# Patient Record
Sex: Female | Born: 1960
Health system: Southern US, Community
[De-identification: ages and names within clinical notes are randomized; demographics above are authoritative.]

## PROBLEM LIST (undated history)

## (undated) ENCOUNTER — Emergency Department (HOSPITAL_COMMUNITY): Admission: EM | Payer: 59

## (undated) DIAGNOSIS — K760 Fatty (change of) liver, not elsewhere classified: Secondary | ICD-10-CM

## (undated) DIAGNOSIS — F329 Major depressive disorder, single episode, unspecified: Secondary | ICD-10-CM

## (undated) DIAGNOSIS — J45909 Unspecified asthma, uncomplicated: Secondary | ICD-10-CM

## (undated) DIAGNOSIS — J449 Chronic obstructive pulmonary disease, unspecified: Secondary | ICD-10-CM

## (undated) DIAGNOSIS — E785 Hyperlipidemia, unspecified: Secondary | ICD-10-CM

## (undated) DIAGNOSIS — F419 Anxiety disorder, unspecified: Secondary | ICD-10-CM

## (undated) DIAGNOSIS — K219 Gastro-esophageal reflux disease without esophagitis: Secondary | ICD-10-CM

## (undated) DIAGNOSIS — T7840XA Allergy, unspecified, initial encounter: Secondary | ICD-10-CM

## (undated) DIAGNOSIS — K2970 Gastritis, unspecified, without bleeding: Secondary | ICD-10-CM

## (undated) DIAGNOSIS — Z87442 Personal history of urinary calculi: Secondary | ICD-10-CM

## (undated) DIAGNOSIS — M1990 Unspecified osteoarthritis, unspecified site: Secondary | ICD-10-CM

## (undated) DIAGNOSIS — M549 Dorsalgia, unspecified: Secondary | ICD-10-CM

## (undated) DIAGNOSIS — K5790 Diverticulosis of intestine, part unspecified, without perforation or abscess without bleeding: Secondary | ICD-10-CM

## (undated) DIAGNOSIS — F32A Depression, unspecified: Secondary | ICD-10-CM

## (undated) DIAGNOSIS — K449 Diaphragmatic hernia without obstruction or gangrene: Secondary | ICD-10-CM

## (undated) DIAGNOSIS — G894 Chronic pain syndrome: Secondary | ICD-10-CM

## (undated) HISTORY — DX: Allergy, unspecified, initial encounter: T78.40XA

## (undated) HISTORY — DX: Chronic pain syndrome: G89.4

## (undated) HISTORY — DX: Fatty (change of) liver, not elsewhere classified: K76.0

## (undated) HISTORY — DX: Diaphragmatic hernia without obstruction or gangrene: K44.9

## (undated) HISTORY — PX: ABDOMINAL HYSTERECTOMY: SHX81

## (undated) HISTORY — DX: Unspecified asthma, uncomplicated: J45.909

## (undated) HISTORY — DX: Anxiety disorder, unspecified: F41.9

## (undated) HISTORY — DX: Major depressive disorder, single episode, unspecified: F32.9

## (undated) HISTORY — DX: Hyperlipidemia, unspecified: E78.5

## (undated) HISTORY — PX: OTHER SURGICAL HISTORY: SHX169

## (undated) HISTORY — DX: Chronic obstructive pulmonary disease, unspecified: J44.9

## (undated) HISTORY — DX: Gastro-esophageal reflux disease without esophagitis: K21.9

## (undated) HISTORY — DX: Gastritis, unspecified, without bleeding: K29.70

## (undated) HISTORY — DX: Diverticulosis of intestine, part unspecified, without perforation or abscess without bleeding: K57.90

## (undated) HISTORY — DX: Depression, unspecified: F32.A

---

## 1999-01-03 ENCOUNTER — Encounter (INDEPENDENT_AMBULATORY_CARE_PROVIDER_SITE_OTHER): Payer: Self-pay | Admitting: Specialist

## 1999-01-03 ENCOUNTER — Inpatient Hospital Stay (HOSPITAL_COMMUNITY): Admission: RE | Admit: 1999-01-03 | Discharge: 1999-01-05 | Payer: Self-pay | Admitting: Urology

## 2002-12-01 ENCOUNTER — Inpatient Hospital Stay (HOSPITAL_COMMUNITY): Admission: EM | Admit: 2002-12-01 | Discharge: 2002-12-04 | Payer: Self-pay | Admitting: Psychiatry

## 2003-04-15 ENCOUNTER — Emergency Department (HOSPITAL_COMMUNITY): Admission: EM | Admit: 2003-04-15 | Discharge: 2003-04-15 | Payer: Self-pay | Admitting: Emergency Medicine

## 2003-04-16 ENCOUNTER — Ambulatory Visit (HOSPITAL_BASED_OUTPATIENT_CLINIC_OR_DEPARTMENT_OTHER): Admission: RE | Admit: 2003-04-16 | Discharge: 2003-04-16 | Payer: Self-pay | Admitting: Urology

## 2003-04-22 ENCOUNTER — Ambulatory Visit (HOSPITAL_COMMUNITY): Admission: RE | Admit: 2003-04-22 | Discharge: 2003-04-22 | Payer: Self-pay | Admitting: Urology

## 2003-11-18 ENCOUNTER — Encounter: Admission: RE | Admit: 2003-11-18 | Discharge: 2003-11-18 | Payer: Self-pay | Admitting: Specialist

## 2009-01-28 ENCOUNTER — Ambulatory Visit (HOSPITAL_COMMUNITY): Admission: RE | Admit: 2009-01-28 | Discharge: 2009-01-28 | Payer: Self-pay | Admitting: Anesthesiology

## 2009-06-02 ENCOUNTER — Encounter: Admission: RE | Admit: 2009-06-02 | Discharge: 2009-06-02 | Payer: Self-pay | Admitting: Anesthesiology

## 2009-12-03 ENCOUNTER — Emergency Department: Payer: Self-pay | Admitting: Emergency Medicine

## 2010-06-25 ENCOUNTER — Encounter: Payer: Self-pay | Admitting: Emergency Medicine

## 2010-07-05 ENCOUNTER — Encounter: Payer: Self-pay | Admitting: Emergency Medicine

## 2010-10-20 NOTE — Discharge Summary (Signed)
Valerie Key, Valerie Key                             ACCOUNT NO.:  [id number]   MEDICAL RECORD NO.:  [id number]                   PATIENT TYPE:  IPS   LOCATION:  0502                                 FACILITY:  [facility]   PHYSICIAN:  Jeanice Lim, M.D.              DATE OF BIRTH:  1960/07/03   DATE OF ADMISSION:  12/01/2002  DATE OF DISCHARGE:  12/04/2002                                 DISCHARGE SUMMARY   IDENTIFYING DATA:  This is a 50 year old married Caucasian female,  voluntarily admitted with a history of depression, reporting increased  symptoms for 2-4 weeks, with depressed mood, anhedonia, lethargy, poor  concentration.  Child custody case was one stressor.  Overdosed on 10 Paxil  in a suicide attempt after 3 drinks of alcohol.   ADMISSION MEDICATIONS:  Paxil 20 mg q.a.m. for 3 years.   ALLERGIES:  SULFA MEDICINES.   PHYSICAL EXAMINATION:  Essentially within normal limits, neurologically  nonfocal.   ROUTINE ADMISSION LABS:  Were within normal limits.   MENTAL STATUS EXAM:  Fully alert, no acute distress female with tearful  affect.  Appearance:  Grooming was adequate.  Speech within normal limits,  mood depressed, helpless.  Thought process goal directed, positive suicidal  ideation, no psychosis, cognitively intact.  Judgment and insight fair to  questionable, with impulse control history questionable.   ADMISSION DIAGNOSES:   AXIS I:  1. Major depressive disorder, recurrent, severe.  2. Rule out alcohol abuse.   AXIS II:  Deferred.   AXIS III:  Status post Paxil overdose.   AXIS IV:  Moderate problems with primary support group.   AXIS V:  30/60.   HOSPITAL COURSE:  The patient was admitted and ordered routine p.r.n.  medications, underwent further monitoring, and was encouraged to participate  in individual, group and milieu therapy.  To complained of severe depressive  symptoms, stressors including custody battle for shared custody with  children.  Had been  drinking 1-3 drinks per day, level on admission was 0.88  showing a clear alcohol dependency and abuse history.  The patient had  responded to Paxil in the past, regretted attempt and reported decrease in  suicidal thoughts, improvement in mood and motivation to stop drinking as  well, reporting positive response to clinical intervention.   CONDITION ON DISCHARGE:  Improved.  Mood was more euthymic, affect brighter,  thought process goal directed.  Thought content negative for dangerous  ideation or psychotic symptoms.  The patient reported motivation to be  compliant with the aftercare plan.   DISCHARGE MEDICATIONS:  1. Paxil CR 25 mg 2 q.a.m.  2. Trazodone 50 mg p.o. q.h.s.   DISPOSITION:  Follow up with [facility], Tobey Bride, on July 12 and Dr. Senaida Ores on July 23 at 9 a.m.   DISCHARGE DIAGNOSES:   AXIS I:  1. Major depressive disorder, recurrent, severe.  2. Rule  out alcohol abuse.   AXIS II:  Deferred.   AXIS III:  Status post Paxil overdose.   AXIS IV:  Moderate problems with primary support group.   AXIS V:  Global assessment of function on discharge was 55.                                                 Jeanice Lim, M.D.    JEM/MEDQ  D:  12/30/2002  T:  12/30/2002  Job:  [id number]

## 2010-10-20 NOTE — Op Note (Signed)
[name], Valerie Key                             ACCOUNT NO.:  [id number]   MEDICAL RECORD NO.:  [id number]                   PATIENT TYPE:  AMB   LOCATION:  NESC                                 FACILITY:  [facility]   PHYSICIAN:  Boston Service, M.D.             DATE OF BIRTH:  1961-01-30   DATE OF PROCEDURE:  04/16/2003  DATE OF DISCHARGE:                                 OPERATIVE REPORT   PREOPERATIVE DIAGNOSIS:  A 6-8 mm calculus, left midureter, appears smaller  on KUB, larger on CT.  The patient has a two-week history of intractable  left costovertebral angle tenderness.   POSTOPERATIVE DIAGNOSIS:  A 6-8 mm calculus, left mid ureter,appears smaller  on KUB, larger on CT.  The patient has a two-week history of intractable  left costovertebral angle tenderness.   PROCEDURES:  1. Cystoscopy.  2. Ureteroscopy.  3. Double J stent placement.   ANESTHESIA:  General.   DRAINS:  6 French, 24 cm double-J stent.   DESCRIPTION OF PROCEDURE:  The patient was prepped and draped in the dorsal  lithotomy position after institution of an adequate level of general  anesthesia.  A well-lubricated 21-French panendoscope was gently inserted at  the urethral meatus.  Normal urethra and sphincter.  Mild ureterocele on  both the right and left sides.  Right retrograde showed normal course and  caliber of the ureter, pelvis and calyces with prompt drainage at three to  five minutes.  Left retrograde showed tight ureteral spasm in the midureter  with 6-8 mm filling defect.  A floppy-tip guide wire could not be safely  negotiated past the stone.  A Glidewire was then negotiated beyond the  stone.  Due to spasm within the midureter, a 6 French end-hole catheter was  advanced over the Glidewire, left in place for five minutes, and then  withdrawn.  The ureteroscope was then inserted alongside the Glidewire.  Area of erythema and edema within the midureter was identified but no stone  was identified.   Strong clinical suspicion that the stone had migrated back  into the left renal pelvis.  Careful inspection of the entire ureter, from  the UPJ to the UVJ, showed no stony fragments or anatomic deformity.  The  ureteroscope was then removed.  A double J stent was inserted over the  indwelling guidewire with excellent pigtail formation on guidewire removal.  The bladder was drained.  The patient was given 30 mg of Toradol and a B&O  suppository.  and returned to the recovery room in satisfactory condition.                                               Boston Service, M.D.    RH/MEDQ  D:  04/16/2003  T:  04/16/2003  Job:  [id number]

## 2011-12-30 ENCOUNTER — Emergency Department (HOSPITAL_COMMUNITY)
Admission: EM | Admit: 2011-12-30 | Discharge: 2011-12-30 | Discharge status: Against Medical Advice | Payer: Commercial Managed Care - PPO | Attending: Emergency Medicine | Admitting: Emergency Medicine

## 2011-12-30 ENCOUNTER — Encounter (HOSPITAL_COMMUNITY): Payer: Self-pay | Admitting: *Deleted

## 2011-12-30 DIAGNOSIS — R110 Nausea: Secondary | ICD-10-CM | POA: Insufficient documentation

## 2011-12-30 DIAGNOSIS — R1011 Right upper quadrant pain: Secondary | ICD-10-CM | POA: Insufficient documentation

## 2011-12-30 HISTORY — DX: Unspecified osteoarthritis, unspecified site: M19.90

## 2011-12-30 HISTORY — DX: Dorsalgia, unspecified: M54.9

## 2011-12-30 LAB — POCT I-STAT, CHEM 8
BUN: 11 mg/dL (ref 6–23)
Calcium, Ion: 1.17 mmol/L (ref 1.12–1.23)
Chloride: 103 mEq/L (ref 96–112)
Creatinine, Ser: 0.8 mg/dL (ref 0.50–1.10)
Glucose, Bld: 129 mg/dL — ABNORMAL HIGH (ref 70–99)
HCT: 46 % (ref 36.0–46.0)
Hemoglobin: 15.6 g/dL — ABNORMAL HIGH (ref 12.0–15.0)
Potassium: 3.4 mEq/L — ABNORMAL LOW (ref 3.5–5.1)
Sodium: 141 mEq/L (ref 135–145)
TCO2: 27 mmol/L (ref 0–100)

## 2011-12-30 NOTE — ED Notes (Signed)
Patient reports indigestion and increased pain in her upper right abd after eating for 2 weeks.  She has had increasing pain and nausea for 2 days.  Patient denies chest pain, denies shoulder pain.  She states her pain is pin pointed to the right upper abd

## 2011-12-31 ENCOUNTER — Encounter: Payer: Self-pay | Admitting: Internal Medicine

## 2012-01-01 ENCOUNTER — Encounter (HOSPITAL_COMMUNITY): Payer: Self-pay | Admitting: Emergency Medicine

## 2012-01-01 ENCOUNTER — Emergency Department (HOSPITAL_COMMUNITY)
Admission: EM | Admit: 2012-01-01 | Discharge: 2012-01-01 | Disposition: A | Discharge status: Home / Self Care | Payer: BC Managed Care – PPO | Attending: Emergency Medicine | Admitting: Emergency Medicine

## 2012-01-01 ENCOUNTER — Emergency Department (HOSPITAL_COMMUNITY): Payer: BC Managed Care – PPO

## 2012-01-01 DIAGNOSIS — R109 Unspecified abdominal pain: Secondary | ICD-10-CM | POA: Insufficient documentation

## 2012-01-01 DIAGNOSIS — R10811 Right upper quadrant abdominal tenderness: Secondary | ICD-10-CM | POA: Insufficient documentation

## 2012-01-01 DIAGNOSIS — N390 Urinary tract infection, site not specified: Secondary | ICD-10-CM | POA: Insufficient documentation

## 2012-01-01 LAB — LIPASE, BLOOD: Lipase: 26 U/L (ref 11–59)

## 2012-01-01 LAB — URINALYSIS, ROUTINE W REFLEX MICROSCOPIC
Bilirubin Urine: NEGATIVE
Glucose, UA: NEGATIVE mg/dL
Hgb urine dipstick: NEGATIVE
Ketones, ur: NEGATIVE mg/dL
Leukocytes, UA: NEGATIVE
Nitrite: POSITIVE — AB
Protein, ur: NEGATIVE mg/dL
Specific Gravity, Urine: 1.012 (ref 1.005–1.030)
Urobilinogen, UA: 0.2 mg/dL (ref 0.0–1.0)
pH: 6.5 (ref 5.0–8.0)

## 2012-01-01 LAB — CBC WITH DIFFERENTIAL/PLATELET
Basophils Absolute: 0.1 10*3/uL (ref 0.0–0.1)
Basophils Relative: 0 % (ref 0–1)
Eosinophils Absolute: 0.6 10*3/uL (ref 0.0–0.7)
Eosinophils Relative: 5 % (ref 0–5)
HCT: 42.6 % (ref 36.0–46.0)
Hemoglobin: 14.6 g/dL (ref 12.0–15.0)
Lymphocytes Relative: 28 % (ref 12–46)
Lymphs Abs: 3.6 10*3/uL (ref 0.7–4.0)
MCH: 30.9 pg (ref 26.0–34.0)
MCHC: 34.3 g/dL (ref 30.0–36.0)
MCV: 90.3 fL (ref 78.0–100.0)
Monocytes Absolute: 0.8 10*3/uL (ref 0.1–1.0)
Monocytes Relative: 6 % (ref 3–12)
Neutro Abs: 7.6 10*3/uL (ref 1.7–7.7)
Neutrophils Relative %: 60 % (ref 43–77)
Platelets: 243 10*3/uL (ref 150–400)
RBC: 4.72 MIL/uL (ref 3.87–5.11)
RDW: 13.4 % (ref 11.5–15.5)
WBC: 12.7 10*3/uL — ABNORMAL HIGH (ref 4.0–10.5)

## 2012-01-01 LAB — COMPREHENSIVE METABOLIC PANEL
ALT: 13 U/L (ref 0–35)
AST: 19 U/L (ref 0–37)
Albumin: 4.1 g/dL (ref 3.5–5.2)
Alkaline Phosphatase: 79 U/L (ref 39–117)
BUN: 10 mg/dL (ref 6–23)
CO2: 29 mEq/L (ref 19–32)
Calcium: 9.7 mg/dL (ref 8.4–10.5)
Chloride: 101 mEq/L (ref 96–112)
Creatinine, Ser: 0.64 mg/dL (ref 0.50–1.10)
GFR calc Af Amer: >90 mL/min (ref >90)
GFR calc non Af Amer: >90 mL/min (ref >90)
Glucose, Bld: 94 mg/dL (ref 70–99)
Potassium: 4 mEq/L (ref 3.5–5.1)
Sodium: 140 mEq/L (ref 135–145)
Total Bilirubin: 0.2 mg/dL — ABNORMAL LOW (ref 0.3–1.2)
Total Protein: 7.6 g/dL (ref 6.0–8.3)

## 2012-01-01 LAB — POCT I-STAT, CHEM 8
BUN: 10 mg/dL (ref 6–23)
Chloride: 103 mEq/L (ref 96–112)
Glucose, Bld: 91 mg/dL (ref 70–99)
HCT: 45 % (ref 36.0–46.0)
Potassium: 4 mEq/L (ref 3.5–5.1)

## 2012-01-01 LAB — URINE MICROSCOPIC-ADD ON

## 2012-01-01 LAB — AMYLASE: Amylase: 71 U/L (ref 0–105)

## 2012-01-01 MED ORDER — NITROFURANTOIN MONOHYD MACRO 100 MG PO CAPS: 100.0000 mg | ORAL_CAPSULE | Freq: Two times a day (BID) | ORAL | Status: AC

## 2012-01-01 MED ORDER — ONDANSETRON HCL 4 MG PO TABS: 4.0000 mg | ORAL_TABLET | Freq: Four times a day (QID) | ORAL | Status: DC | PRN

## 2012-01-01 MED ORDER — ONDANSETRON HCL 4 MG/2ML IJ SOLN
4.0000 mg | Freq: Once | INTRAMUSCULAR | Status: AC
Start: 2012-01-01 — End: 2012-01-01
  Administered 2012-01-01: 4 mg via INTRAVENOUS
  Filled 2012-01-01: qty 2

## 2012-01-01 MED ORDER — MORPHINE SULFATE 4 MG/ML IJ SOLN
4.0000 mg | Freq: Once | INTRAMUSCULAR | Status: AC
  Administered 2012-01-01: 4 mg via INTRAVENOUS
  Filled 2012-01-01: qty 1

## 2012-01-01 MED ORDER — ONDANSETRON HCL 4 MG PO TABS: 4.0000 mg | ORAL_TABLET | Freq: Four times a day (QID) | ORAL | Status: AC | PRN

## 2012-01-01 NOTE — ED Provider Notes (Signed)
History     CSN: [id number]  Arrival date & time 01/01/12  1115   First MD Initiated Contact with Patient 01/01/12 1224      Chief Complaint  Patient presents with  . Abdominal Pain  . Nausea    (Consider location/radiation/quality/duration/timing/severity/associated sxs/prior treatment) Patient is a 51 y.o. female presenting with abdominal pain. The history is provided by the patient.  Abdominal Pain The primary symptoms of the illness include abdominal pain and nausea. The primary symptoms of the illness do not include fever, shortness of breath, vomiting, diarrhea, dysuria or vaginal discharge. Episode onset: 3 weeks ago. The onset of the illness was gradual. The problem has been gradually worsening.  The abdominal pain is located in the RUQ. The severity of the abdominal pain is 10/10. The abdominal pain is relieved by nothing. The abdominal pain is exacerbated by certain positions.  The illness is associated with eating. The patient states that she believes she is currently not pregnant. The patient has not had a change in bowel habit. Additional symptoms associated with the illness include heartburn (none today) and back pain (chronic, unchanged from baseline). Symptoms associated with the illness do not include chills.    Past Medical History  Diagnosis Date  . Arthritis   . Back pain     Past Surgical History  Procedure Date  . Abdominal hysterectomy     No family history on file.  History  Substance Use Topics  . Smoking status: Current Everyday Smoker  . Smokeless tobacco: Not on file  . Alcohol Use: Yes    Review of Systems  Constitutional: Negative for fever and chills.  Respiratory: Negative for shortness of breath.   Gastrointestinal: Positive for heartburn (none today), nausea and abdominal pain. Negative for vomiting and diarrhea.  Genitourinary: Negative for dysuria and vaginal discharge.  Musculoskeletal: Positive for back pain (chronic, unchanged  from baseline).  All other systems reviewed and are negative.    Allergies  Sulfur  Home Medications   Current Outpatient Rx  Name Route Sig Dispense Refill  . GABAPENTIN 100 MG PO CAPS Oral Take 200 mg by mouth at bedtime.    Marland Kitchen HYDROCODONE-ACETAMINOPHEN 5-500 MG PO TABS Oral Take 1 tablet by mouth 2 (two) times daily as needed. For break through pain    . MORPHINE SULFATE ER 30 MG PO CP24 Oral Take 30 mg by mouth 2 (two) times daily.    Marland Kitchen OVER THE COUNTER MEDICATION Oral Take 1 tablet by mouth daily. I COOL monopause  Medication    . RANITIDINE HCL 150 MG PO TABS Oral Take 150 mg by mouth 2 (two) times daily.      BP 138/78  Pulse 88  Temp 98.3 F (36.8 C) (Oral)  Resp 12  SpO2 100%  Physical Exam  Nursing note and vitals reviewed. Constitutional: She appears well-developed and well-nourished. No distress.  HENT:  Head: Normocephalic and atraumatic.  Right Ear: External ear normal.  Left Ear: External ear normal.       MMM  Eyes: Conjunctivae are normal.  Neck: Neck supple.  Cardiovascular: Normal rate, regular rhythm and normal heart sounds.   Pulmonary/Chest: Effort normal and breath sounds normal. No respiratory distress. She has no wheezes.  Abdominal: Soft. Bowel sounds are normal. She exhibits no distension. There is tenderness (RUQ only).  Musculoskeletal: She exhibits no edema.  Neurological: She is alert.  Skin: Skin is warm and dry. No rash noted.  Psychiatric: She has a normal mood and  affect.    ED Course  Procedures (including critical care time)  Labs Reviewed  URINALYSIS, ROUTINE W REFLEX MICROSCOPIC - Abnormal; Notable for the following:    APPearance CLOUDY (*)     Nitrite POSITIVE (*)     All other components within normal limits  CBC WITH DIFFERENTIAL - Abnormal; Notable for the following:    WBC 12.7 (*)     All other components within normal limits  COMPREHENSIVE METABOLIC PANEL - Abnormal; Notable for the following:    Total Bilirubin  0.2 (*)     All other components within normal limits  POCT I-STAT, CHEM 8 - Abnormal; Notable for the following:    Hemoglobin 15.3 (*)     All other components within normal limits  URINE MICROSCOPIC-ADD ON - Abnormal; Notable for the following:    Squamous Epithelial / LPF FEW (*)     Bacteria, UA MANY (*)     All other components within normal limits  AMYLASE  LIPASE, BLOOD   US Abdomen Complete  01/01/2012  *RADIOLOGY REPORT*  Clinical Data:  Right upper quadrant pain and leukocytosis.  COMPLETE ABDOMINAL ULTRASOUND  Comparison:  None.  Findings:  Gallbladder:  No gallstones, gallbladder wall thickening, or pericholecystic fluid.  Common bile duct:  5 mm, within normal limits.  Liver:  No focal lesion identified.  Within normal limits in parenchymal echogenicity.  IVC:  Appears normal.  Pancreas:  No focal abnormality seen.  Spleen:  5.2 cm, negative.  Right Kidney:  10.3 cm.  Parenchymal echo texture is normal.  No hydronephrosis.  Left Kidney:  11.0 cm.  Parenchymal echo texture is normal.  No hydronephrosis.  Abdominal aorta:  No aneurysm identified.  IMPRESSION: Negative abdominal ultrasound.  Original Report Authenticated By: Reyes Ivan, M.D.     Dx 1: Abd pain Dx 2: UTI   MDM  RUQ abd pain, leukocytosis. LFTs, lipase WNL. Will proceed with US abdomen to eval for cholecystitis. UTI.    3:23 PM [place] abd reviewed, normal. Tolerates PO in ED. VSS. Has appt with [facility] tomorrow. Will give pain/nausea rx for use until that time. Discussed return precautions. Will tx for UTI. No evidence of pyelo on PE.  Shaaron Adler, PA-C 01/01/12 1524  Shaaron Adler, PA-C 01/01/12 1525

## 2012-01-01 NOTE — ED Provider Notes (Signed)
Medical screening examination/treatment/procedure(s) were performed by non-physician practitioner and as supervising physician I was immediately available for consultation/collaboration.    Nelia Shi, MD 01/01/12 1537

## 2012-01-01 NOTE — ED Notes (Signed)
Attempted IV access x2, Traci Rn at bedside.

## 2012-01-01 NOTE — ED Notes (Signed)
Rt upper quad abd pain for a few weeks was seen at [facility] last wek but she left due to waiting to long. Has nausea no vomiting pain radates to upper back. Pain worse after she eats with pain not going away completely after digestion.

## 2012-01-01 NOTE — ED Notes (Signed)
Ultrasound at bedside

## 2012-01-02 ENCOUNTER — Encounter: Payer: Self-pay | Admitting: Internal Medicine

## 2012-01-02 ENCOUNTER — Ambulatory Visit (INDEPENDENT_AMBULATORY_CARE_PROVIDER_SITE_OTHER): Payer: BC Managed Care – PPO | Admitting: Internal Medicine

## 2012-01-02 VITALS — BP 116/70 | HR 96 | Ht 64.0 in | Wt 136.1 lb

## 2012-01-02 DIAGNOSIS — IMO0002 Reserved for concepts with insufficient information to code with codable children: Secondary | ICD-10-CM | POA: Insufficient documentation

## 2012-01-02 DIAGNOSIS — R1013 Epigastric pain: Secondary | ICD-10-CM

## 2012-01-02 DIAGNOSIS — K3189 Other diseases of stomach and duodenum: Secondary | ICD-10-CM

## 2012-01-02 DIAGNOSIS — R110 Nausea: Secondary | ICD-10-CM

## 2012-01-02 DIAGNOSIS — M549 Dorsalgia, unspecified: Secondary | ICD-10-CM | POA: Insufficient documentation

## 2012-01-02 MED ORDER — PANTOPRAZOLE SODIUM 40 MG PO TBEC: 40.0000 mg | DELAYED_RELEASE_TABLET | Freq: Every day | ORAL | Status: DC

## 2012-01-02 NOTE — Patient Instructions (Addendum)
You have been given a separate informational sheet regarding your tobacco use, the importance of quitting and local resources to help you quit.  You have been scheduled for an endoscopy with propofol. Please follow written instructions given to you at your visit today. If you use inhalers (even only as needed), please bring them with you on the day of your procedure.  We have sent the following medications to your pharmacy for you to pick up at your convenience: Protonix  Discontinue Zantac  Avoid NSAID's medications  NSAIDs and Peptic Ulcers A peptic ulcer is a defect that forms in the lining of the stomach or first part of the small intestine.  CAUSES  Most peptic ulcers are caused by infection with the bacterium H. pylori (Helicobacter pylori). Some peptic ulcers are caused by prolonged use of NSAIDs (nonsteroidal anti-inflammatory drugs). NSAIDs include aspirin, ibuprofen, and naproxen sodium. SYMPTOMS  An ulcer can cause:  A gnawing, burning pain in the upper abdomen.   Nausea.   Vomiting.   Loss of appetite.   Weight loss.   Fatigue.  Normally the stomach has three defenses against digestive juices:   Mucus that coats the stomach lining and shields it from stomach acid.   The chemical bicarbonate that neutralizes stomach acid.   Blood circulation to the stomach lining that aids in cell renewal and repair.  NSAIDs hinder all of these protective mechanisms. With the stomach's defenses down, digestive juices can damage the sensitive stomach lining and cause ulcers. TREATMENT   NSAID-induced ulcers usually heal once the person stops taking the medication. Your caregiver may recommend taking antacids to neutralize the acid. Antacids help the healing process and relieve symptoms. Drugs called H2-blockers or proton-pump inhibitors decrease the amount of acid the stomach produces. Medicines that protect the stomach lining also help with healing.   If a person with an NSAID  ulcer also tests positive for H. pylori, he or she will be treated with antibiotics.   Surgery may be necessary if an ulcer recurs or fails to heal.   Surgery may also be necessary if complications like:   Severe bleeding.   Perforation.   Obstruction develop.  Anyone taking NSAIDs who experiences symptoms of peptic ulcer should see their caregiver for prompt treatment. Delaying diagnosis and treatment can lead to complications and the need for surgery. FOR MORE INFORMATION National Digestive Diseases Information Clearinghouse: http://digestive.EntertainmentBlogging.co.nz.htm Document Released: 04/26/2004 Document Revised: 05/10/2011 Document Reviewed: 05/06/2007 [facility] Patient Information 2012 [place], [place].

## 2012-01-02 NOTE — Progress Notes (Signed)
Patient ID: Valerie Key, female   DOB: Aug 19, 1960, 51 y.o.   MRN: [id number]  SUBJECTIVE: HPI Valerie Key is a 51 year old female with a PMH of degenerative disc disease/arthritis on chronic narcotic, anxiety and depression who is seen for evaluation of abdominal pain and dyspepsia. The patient reports indigestion which started 3 weeks ago. Prior to this it was rare and she used TUMS as needed with relief. However over the last 3 weeks she's had worsening indigestion epigastric and right upper quadrant abdominal pain. This is worse after eating, but is present during most times.  It can be throbbing and sharp. She reports nausea without vomiting. She's also had increased belching. She was recently seen in the ER for this issue and had an abdominal ultrasound. She was given Zofran for nausea and Macrobid for a UTI.  She denies change in her bowel habits including no melena or rectal bleeding. She's had no urinary symptoms including no dysuria, frequency, or hesitancy.  No fevers or chills. She also has been taking ranitidine twice daily for the last 3-4 weeks without much relief. She denies the use of frequent NSAIDs  Review of Systems  As per history of present illness, otherwise negative   Past Medical History  Diagnosis Date  . Arthritis   . Back pain   . Anxiety   . Depression     Current Outpatient Prescriptions  Medication Sig Dispense Refill  . ALPRAZolam (XANAX) 0.5 MG tablet 1/2-1 tablet by mouth twice a day as needed for anxiety      . gabapentin (NEURONTIN) 100 MG capsule Take 200 mg by mouth at bedtime.      Marland Kitchen HYDROcodone-acetaminophen (VICODIN) 5-500 MG per tablet Take 1 tablet by mouth 2 (two) times daily as needed. For break through pain      . morphine (KADIAN) 30 MG 24 hr capsule Take 30 mg by mouth 2 (two) times daily.      . nitrofurantoin, macrocrystal-monohydrate, (MACROBID) 100 MG capsule Take 1 capsule (100 mg total) by mouth 2 (two) times daily.  14 capsule  0  .  ondansetron (ZOFRAN) 4 MG tablet Take 1 tablet (4 mg total) by mouth every 6 (six) hours as needed for nausea.  12 tablet  0  . OVER THE COUNTER MEDICATION Take 1 tablet by mouth daily. I COOL monopause  Medication      . pantoprazole (PROTONIX) 40 MG tablet Take 1 tablet (40 mg total) by mouth daily.  90 tablet  3   No current facility-administered medications for this visit.   Facility-Administered Medications Ordered in Other Visits  Medication Dose Route Frequency Provider Last Rate Last Dose  . morphine 4 MG/ML injection 4 mg  4 mg Intravenous Once Shaaron Adler, PA-C   4 mg at 01/01/12 1343  . ondansetron (ZOFRAN) injection 4 mg  4 mg Intravenous Once Shaaron Adler, PA-C   4 mg at 01/01/12 1342    Allergies  Allergen Reactions  . Sulfur Rash    Family History  Problem Relation Age of Onset  . Colon cancer Paternal Uncle     x 2  . Diabetes Father     History  Substance Use Topics  . Smoking status: Current Everyday Smoker    Types: Cigarettes  . Smokeless tobacco: Never Used  . Alcohol Use: Yes     2 per month    OBJECTIVE: BP 116/70  Pulse 96  Ht 5\' 4"  (1.626 m)  Wt 136 lb 2  oz (61.746 kg)  BMI 23.37 kg/m2 Constitutional: Well-developed and well-nourished. No distress. HEENT: Normocephalic and atraumatic. Oropharynx is clear and moist. No oropharyngeal exudate. Conjunctivae are normal. Pupils are equal round and reactive to light. No scleral icterus. Neck: Neck supple. Trachea midline. Cardiovascular: Normal rate, regular rhythm and intact distal pulses. No M/R/G Pulmonary/chest: Effort normal and breath sounds normal. No wheezing, rales or rhonchi. Abdominal: Soft, epigastric tenderness without rebound or guarding, nondistended. Bowel sounds active throughout. There are no masses palpable. No hepatosplenomegaly. Extremities: no clubbing, cyanosis, or edema Lymphadenopathy: No cervical adenopathy noted. Neurological: Alert and oriented to  person place and time. Skin: Skin is warm and dry. No rashes noted. Psychiatric: Normal mood and affect. Behavior is normal.  Labs and Imaging -- COMPLETE ABDOMINAL ULTRASOUND -- 01/01/2012   Comparison:  None.   Findings:   Gallbladder:  No gallstones, gallbladder wall thickening, or pericholecystic fluid.   Common bile duct:  5 mm, within normal limits.   Liver:  No focal lesion identified.  Within normal limits in parenchymal echogenicity.   IVC:  Appears normal.   Pancreas:  No focal abnormality seen.   Spleen:  5.2 cm, negative.   Right Kidney:  10.3 cm.  Parenchymal echo texture is normal.  No hydronephrosis.   Left Kidney:  11.0 cm.  Parenchymal echo texture is normal.  No hydronephrosis.   Abdominal aorta:  No aneurysm identified.   IMPRESSION: Negative abdominal ultrasound.  CMP     Component Value Date/Time   NA 142 01/01/2012 1225   K 4.0 01/01/2012 1225   CL 103 01/01/2012 1225   CO2 29 01/01/2012 1215   GLUCOSE 91 01/01/2012 1225   BUN 10 01/01/2012 1225   CREATININE 0.90 01/01/2012 1225   CALCIUM 9.7 01/01/2012 1215   PROT 7.6 01/01/2012 1215   ALBUMIN 4.1 01/01/2012 1215   AST 19 01/01/2012 1215   ALT 13 01/01/2012 1215   ALKPHOS 79 01/01/2012 1215   BILITOT 0.2* 01/01/2012 1215   GFRNONAA >90 01/01/2012 1215   GFRAA >90 01/01/2012 1215    Lipase     Component Value Date/Time   LIPASE 26 01/01/2012 1215    CBC    Component Value Date/Time   WBC 12.7* 01/01/2012 1215   RBC 4.72 01/01/2012 1215   HGB 15.3* 01/01/2012 1225   HCT 45.0 01/01/2012 1225   PLT 243 01/01/2012 1215   MCV 90.3 01/01/2012 1215   MCH 30.9 01/01/2012 1215   MCHC 34.3 01/01/2012 1215   RDW 13.4 01/01/2012 1215   LYMPHSABS 3.6 01/01/2012 1215   MONOABS 0.8 01/01/2012 1215   EOSABS 0.6 01/01/2012 1215   BASOSABS 0.1 01/01/2012 1215    ASSESSMENT AND PLAN: 51 year old female with a PMH of degenerative disc disease/arthritis on chronic narcotic, anxiety and depression who is seen for  evaluation of abdominal pain and dyspepsia.  1. Dyspepsia/upper abdominal pain -- the patient's symptoms are most consistent with ulcer-like dyspepsia. Biliary type pain is also not excluded, however her abdominal ultrasound showed normal gallbladder without stones. For now I recommended further evaluation with EGD. I would also like to discontinue her Zantac and give her prescription for pantoprazole 40 mg twice daily. If her upper endoscopy is unrevealing, then we could consider HIDA scan with CCK to evaluate gallbladder function.  I've asked that she avoid NSAIDs. She can continue to use Zofran as needed for nausea. EGD later this week and further recommendations thereafter

## 2012-01-03 ENCOUNTER — Encounter: Payer: BC Managed Care – PPO | Admitting: Internal Medicine

## 2012-01-04 ENCOUNTER — Ambulatory Visit (AMBULATORY_SURGERY_CENTER): Payer: BC Managed Care – PPO | Admitting: Internal Medicine

## 2012-01-04 ENCOUNTER — Encounter: Payer: Self-pay | Admitting: Internal Medicine

## 2012-01-04 VITALS — BP 155/80 | HR 107 | Temp 98.6°F | Resp 18 | Ht 64.0 in | Wt 136.0 lb

## 2012-01-04 DIAGNOSIS — K3189 Other diseases of stomach and duodenum: Secondary | ICD-10-CM

## 2012-01-04 DIAGNOSIS — R110 Nausea: Secondary | ICD-10-CM

## 2012-01-04 DIAGNOSIS — R1013 Epigastric pain: Secondary | ICD-10-CM

## 2012-01-04 DIAGNOSIS — K2960 Other gastritis without bleeding: Secondary | ICD-10-CM

## 2012-01-04 MED ORDER — SODIUM CHLORIDE 0.9 % IV SOLN: 500.0000 mL | INTRAVENOUS | Status: DC

## 2012-01-04 NOTE — Op Note (Signed)
[facility] [address] [place], [place]  [address]  ENDOSCOPY PROCEDURE REPORT  PATIENT:  Valerie Key, Valerie Key  MR#:  [id number] BIRTHDATE:  [birth year], 51 yrs. old  GENDER:  female ENDOSCOPIST:  Carie Caddy. Vennessa Affinito, MD  PROCEDURE DATE:  01/04/2012 PROCEDURE:  EGD with biopsy for H. pylori 19147 ASA CLASS:  Class II INDICATIONS:  dyspepsia, epigastric pain MEDICATIONS:   MAC sedation, administered by CRNA, propofol (Diprivan) 250 mg IV TOPICAL ANESTHETIC:  none  DESCRIPTION OF PROCEDURE:   After the risks benefits and alternatives of the procedure were thoroughly explained, informed consent was obtained.  The LB GIF-H180 [id number] endoscope was introduced through the mouth and advanced to the second portion of the duodenum, without limitations.  The instrument was slowly withdrawn as the mucosa was fully examined. <<PROCEDUREIMAGES>>  A non-obstructing Schatzki's ring was found at the gastroesophageal junction.  Otherwise normal esophagus.  A 4 cm hiatal hernia was found.  Mild gastritis was found antrum. Biopsies of the antrum and body of the stomach were obtained and sent to pathology.  The duodenal bulb was normal in appearance, as was the postbulbar duodenum.    Retroflexed views revealed a hiatal hernia.    The scope was then withdrawn from the patient and the procedure completed.  COMPLICATIONS:  None  ENDOSCOPIC IMPRESSION: 1) Non-obstructing Schatzki's ring at the gastroesophageal junction 2) Otherwise normal esophagus 3) Hiatal hernia 4) Mild gastritis in the antrum.  Biopsies obtained and sent to pathology. 5) Normal duodenum  RECOMMENDATIONS: 1) Await pathology results 2) Avoid NSAIDs 3) Follow-up of helicobacter pylori status, treat if indicated 4) Okay to reduce dose of pantoprazole to 40 mg daily (30 minutes before breakfast) 5) Office followup in 4-6 weeks to reassess symptoms. 6) Call our office if symptoms worsen or fail to improve.  Carie Caddy. Rossi Silvestro,  MD  CC:  The Patient  n. eSIGNED:   Carie Caddy. Paschal Blanton at 01/04/2012 02:28 PM  Domingo Cocking, [phone]

## 2012-01-04 NOTE — Patient Instructions (Addendum)

## 2012-01-04 NOTE — Progress Notes (Signed)
Patient did not experience any of the following events: a burn prior to discharge; a fall within the facility; wrong site/side/patient/procedure/implant event; or a hospital transfer or hospital admission upon discharge from the facility. (G8907) Patient did not have preoperative order for IV antibiotic SSI prophylaxis. (G8918)  

## 2012-01-07 ENCOUNTER — Telehealth: Payer: Self-pay | Admitting: *Deleted

## 2012-01-07 NOTE — Telephone Encounter (Signed)
  Follow up Call-  Call back number 01/04/2012  Post procedure Call Back phone  # [phone]  Permission to leave phone message Yes     Patient questions:  Do you have a fever, pain , or abdominal swelling? no Pain Score  0 *  Have you tolerated food without any problems? no  Have you been able to return to your normal activities? no  Do you have any questions about your discharge instructions: Diet   no Medications  no Follow up visit  no  Do you have questions or concerns about your Care? no  Actions: * If pain score is 4 or above: No action needed, pain <4.

## 2012-01-08 ENCOUNTER — Other Ambulatory Visit: Payer: Self-pay | Admitting: *Deleted

## 2012-01-14 ENCOUNTER — Encounter: Payer: Self-pay | Admitting: Internal Medicine

## 2012-02-11 ENCOUNTER — Encounter: Payer: Self-pay | Admitting: Internal Medicine

## 2012-02-12 ENCOUNTER — Ambulatory Visit (INDEPENDENT_AMBULATORY_CARE_PROVIDER_SITE_OTHER): Payer: BC Managed Care – PPO | Admitting: Internal Medicine

## 2012-02-12 ENCOUNTER — Encounter: Payer: Self-pay | Admitting: Internal Medicine

## 2012-02-12 VITALS — BP 132/76 | HR 84 | Ht 62.5 in | Wt 137.8 lb

## 2012-02-12 DIAGNOSIS — R1013 Epigastric pain: Secondary | ICD-10-CM | POA: Insufficient documentation

## 2012-02-12 DIAGNOSIS — K3189 Other diseases of stomach and duodenum: Secondary | ICD-10-CM

## 2012-02-12 DIAGNOSIS — Z1211 Encounter for screening for malignant neoplasm of colon: Secondary | ICD-10-CM

## 2012-02-12 DIAGNOSIS — R110 Nausea: Secondary | ICD-10-CM

## 2012-02-12 MED ORDER — PANTOPRAZOLE SODIUM 40 MG PO TBEC: 40.0000 mg | DELAYED_RELEASE_TABLET | Freq: Every day | ORAL | Status: DC

## 2012-02-12 NOTE — Patient Instructions (Signed)
We have sent the following medications to your pharmacy for you to pick up at your convenience: Protonix   Please follow up with Dr. Rhea Belton in 1 year. Or as needed

## 2012-02-12 NOTE — Progress Notes (Signed)
  Subjective:    Patient ID: Valerie Key, female    DOB: [birth year], 51 y.o.   MRN: [id number]  HPI Valerie Key is a 51 year old female with a PMH of degenerative disc disease/arthritis on chronic narcotic, anxiety and depression who is seen in followup. She was initially seen for evaluation of epigastric abdominal pain/dyspepsia and underwent upper endoscopy on 01/04/2012. EGD showed nonobstructive Schatzki's ring, a hiatus hernia, and mild gastritis. Biopsies were negative for H. pylori or abnormal cells. She was started on PPI, initially pantoprazole twice a day, but this was changed to once daily after her EGD. Today she returns stating that she is much improved. Her epigastric abdominal pain and intermittent nausea has resolved. She is eating well, but has remained hesitant to eat spicy foods. No vomiting. No fevers or chills. No current issues with dysphagia or odynophagia. Bowel habits have been normal without melena or bright red blood per rectum.  Review of Systems As per history of present illness, otherwise negative  Current Medications, Allergies, Past Medical History, Past Surgical History, Family History and Social History were reviewed in [employer] record.      Objective:   Physical Exam BP 132/76  Pulse 84  Ht 5' 2.5" (1.588 m)  Wt 137 lb 12.8 oz (62.506 kg)  BMI 24.80 kg/m2 Constitutional: Well-developed and well-nourished. No distress. HEENT: Normocephalic and atraumatic. Oropharynx is clear and moist. No oropharyngeal exudate. Conjunctivae are normal.  No scleral icterus. Cardiovascular: Normal rate, regular rhythm and intact distal pulses. No M/R/G Pulmonary/chest: Effort normal and breath sounds normal. No wheezing, rales or rhonchi. Abdominal: Soft, nontender, nondistended. Bowel sounds active throughout. There are no masses palpable. No hepatosplenomegaly. Extremities: no clubbing, cyanosis, or edema Neurological: Alert and oriented to person  place and time. Skin: Skin is warm and dry. No rashes noted. Psychiatric: Normal mood and affect. Behavior is normal.    Assessment & Plan:   51 year old female with a PMH of degenerative disc disease/arthritis on chronic narcotic, anxiety and depression who is seen in followup.  1. Dyspepsia -- the patient's symptoms are completely responded to once daily PPI. Her EGD was reassuring and we have discussed these findings including pathology results today. For now she will continue daily pantoprazole 40 mg. She is reminded to take this 30 minutes to one hour before her first meal of the day. She can followup in 1 year for this issue or sooner if necessary. We discussed her Schatzki's ring at present she is not having obstructive symptoms. If she develops dysphagia in the future, dilation can be considered.  2.  CRC screening -- she is average risk from a colon cancer screening standpoint, and she has not yet 52. We discussed this test today and she plans to proceed when she turns 52. We can discuss this at followup.

## 2014-01-18 ENCOUNTER — Emergency Department (HOSPITAL_COMMUNITY)
Admission: EM | Admit: 2014-01-18 | Discharge: 2014-01-18 | Disposition: A | Discharge status: Home / Self Care | Payer: Commercial Managed Care - PPO | Attending: Emergency Medicine | Admitting: Emergency Medicine

## 2014-01-18 ENCOUNTER — Encounter (HOSPITAL_COMMUNITY): Payer: Self-pay | Admitting: Emergency Medicine

## 2014-01-18 DIAGNOSIS — R109 Unspecified abdominal pain: Secondary | ICD-10-CM | POA: Insufficient documentation

## 2014-01-18 DIAGNOSIS — Z8739 Personal history of other diseases of the musculoskeletal system and connective tissue: Secondary | ICD-10-CM | POA: Diagnosis not present

## 2014-01-18 DIAGNOSIS — F3289 Other specified depressive episodes: Secondary | ICD-10-CM | POA: Insufficient documentation

## 2014-01-18 DIAGNOSIS — N12 Tubulo-interstitial nephritis, not specified as acute or chronic: Secondary | ICD-10-CM | POA: Diagnosis not present

## 2014-01-18 DIAGNOSIS — Z79899 Other long term (current) drug therapy: Secondary | ICD-10-CM | POA: Insufficient documentation

## 2014-01-18 DIAGNOSIS — F411 Generalized anxiety disorder: Secondary | ICD-10-CM | POA: Insufficient documentation

## 2014-01-18 DIAGNOSIS — F17200 Nicotine dependence, unspecified, uncomplicated: Secondary | ICD-10-CM | POA: Diagnosis not present

## 2014-01-18 DIAGNOSIS — F329 Major depressive disorder, single episode, unspecified: Secondary | ICD-10-CM | POA: Diagnosis not present

## 2014-01-18 LAB — CBC WITH DIFFERENTIAL/PLATELET
Basophils Absolute: 0.1 10*3/uL (ref 0.0–0.1)
Basophils Relative: 1 % (ref 0–1)
Eosinophils Absolute: 0.4 10*3/uL (ref 0.0–0.7)
Eosinophils Relative: 4 % (ref 0–5)
HCT: 42.2 % (ref 36.0–46.0)
Hemoglobin: 13.8 g/dL (ref 12.0–15.0)
Lymphocytes Relative: 38 % (ref 12–46)
Lymphs Abs: 4.2 10*3/uL — ABNORMAL HIGH (ref 0.7–4.0)
MCH: 30.3 pg (ref 26.0–34.0)
MCHC: 32.7 g/dL (ref 30.0–36.0)
MCV: 92.7 fL (ref 78.0–100.0)
Monocytes Absolute: 0.8 10*3/uL (ref 0.1–1.0)
Monocytes Relative: 7 % (ref 3–12)
Neutro Abs: 5.6 10*3/uL (ref 1.7–7.7)
Neutrophils Relative %: 50 % (ref 43–77)
Platelets: 206 10*3/uL (ref 150–400)
RBC: 4.55 MIL/uL (ref 3.87–5.11)
RDW: 13.4 % (ref 11.5–15.5)
WBC: 11 10*3/uL — ABNORMAL HIGH (ref 4.0–10.5)

## 2014-01-18 LAB — URINALYSIS, ROUTINE W REFLEX MICROSCOPIC
Bilirubin Urine: NEGATIVE
Glucose, UA: NEGATIVE mg/dL
Hgb urine dipstick: NEGATIVE
Ketones, ur: NEGATIVE mg/dL
Nitrite: POSITIVE — AB
Protein, ur: NEGATIVE mg/dL
Specific Gravity, Urine: 1.009 (ref 1.005–1.030)
Urobilinogen, UA: 0.2 mg/dL (ref 0.0–1.0)
pH: 6.5 (ref 5.0–8.0)

## 2014-01-18 LAB — BASIC METABOLIC PANEL
Anion gap: 14 (ref 5–15)
BUN: 9 mg/dL (ref 6–23)
CO2: 26 mEq/L (ref 19–32)
Calcium: 9.4 mg/dL (ref 8.4–10.5)
Chloride: 100 mEq/L (ref 96–112)
Creatinine, Ser: 0.7 mg/dL (ref 0.50–1.10)
GFR calc Af Amer: >90 mL/min (ref >90)
GFR calc non Af Amer: >90 mL/min (ref >90)
Glucose, Bld: 90 mg/dL (ref 70–99)
Potassium: 3.8 mEq/L (ref 3.7–5.3)
Sodium: 140 mEq/L (ref 137–147)

## 2014-01-18 LAB — URINE MICROSCOPIC-ADD ON

## 2014-01-18 MED ORDER — ONDANSETRON HCL 4 MG/2ML IJ SOLN
4.0000 mg | Freq: Once | INTRAMUSCULAR | Status: AC
  Administered 2014-01-18: 4 mg via INTRAVENOUS
  Filled 2014-01-18: qty 2

## 2014-01-18 MED ORDER — DEXTROSE 5 % IV SOLN
1.0000 g | Freq: Once | INTRAVENOUS | Status: AC
  Administered 2014-01-18: 1 g via INTRAVENOUS
  Filled 2014-01-18: qty 10

## 2014-01-18 MED ORDER — MORPHINE SULFATE 4 MG/ML IJ SOLN
6.0000 mg | Freq: Once | INTRAMUSCULAR | Status: AC
  Administered 2014-01-18: 6 mg via INTRAVENOUS
  Filled 2014-01-18: qty 2

## 2014-01-18 MED ORDER — CEPHALEXIN 500 MG PO CAPS: 500.0000 mg | ORAL_CAPSULE | Freq: Four times a day (QID) | ORAL | Status: DC

## 2014-01-18 NOTE — ED Notes (Signed)
Pt reports flank pain x 1 week.  Was dx with UTI July 30th and was put on abx-pt reports only taking one dose of the abx d/t nausea.  Pt reports pain in her R side is better when laying down, but worse when sitting up or with movement.  Pt denies any urinary sxs at this time.

## 2014-01-18 NOTE — Discharge Instructions (Signed)
Pyelonephritis, Adult °Pyelonephritis is a kidney infection. In general, there are 2 main types of pyelonephritis: °· Infections that come on quickly without any warning (acute pyelonephritis). °· Infections that persist for a long period of time (chronic pyelonephritis). °CAUSES  °Two main causes of pyelonephritis are: °· Bacteria traveling from the bladder to the kidney. This is a problem especially in pregnant women. The urine in the bladder can become filled with bacteria from multiple causes, including: °¨ Inflammation of the prostate gland (prostatitis). °¨ Sexual intercourse in females. °¨ Bladder infection (cystitis). °· Bacteria traveling from the bloodstream to the tissue part of the kidney. °Problems that may increase your risk of getting a kidney infection include: °· Diabetes. °· Kidney stones or bladder stones. °· Cancer. °· Catheters placed in the bladder. °· Other abnormalities of the kidney or ureter. °SYMPTOMS  °· Abdominal pain. °· Pain in the side or flank area. °· Fever. °· Chills. °· Upset stomach. °· Blood in the urine (dark urine). °· Frequent urination. °· Strong or persistent urge to urinate. °· Burning or stinging when urinating. °DIAGNOSIS  °Your caregiver may diagnose your kidney infection based on your symptoms. A urine sample may also be taken. °TREATMENT  °In general, treatment depends on how severe the infection is.  °· If the infection is mild and caught early, your caregiver may treat you with oral antibiotics and send you home. °· If the infection is more severe, the bacteria may have gotten into the bloodstream. This will require intravenous (IV) antibiotics and a hospital stay. Symptoms may include: °¨ High fever. °¨ Severe flank pain. °¨ Shaking chills. °· Even after a hospital stay, your caregiver may require you to be on oral antibiotics for a period of time. °· Other treatments may be required depending upon the cause of the infection. °HOME CARE INSTRUCTIONS  °· Take your  antibiotics as directed. Finish them even if you start to feel better. °· Make an appointment to have your urine checked to make sure the infection is gone. °· Drink enough fluids to keep your urine clear or pale yellow. °· Take medicines for the bladder if you have urgency and frequency of urination as directed by your caregiver. °SEEK IMMEDIATE MEDICAL CARE IF:  °· You have a fever or persistent symptoms for more than 2-3 days. °· You have a fever and your symptoms suddenly get worse. °· You are unable to take your antibiotics or fluids. °· You develop shaking chills. °· You experience extreme weakness or fainting. °· There is no improvement after 2 days of treatment. °MAKE SURE YOU: °· Understand these instructions. °· Will watch your condition. °· Will get help right away if you are not doing well or get worse. °Document Released: 05/21/2005 Document Revised: 11/20/2011 Document Reviewed: 10/25/2010 °[employer]® Patient Information ©2015 [employer]. This information is not intended to replace advice given to you by your health care provider. Make sure you discuss any questions you have with your health care provider. ° °

## 2014-01-18 NOTE — ED Provider Notes (Signed)
CSN: [id number]     Arrival date & time 01/18/14  1832 History   First MD Initiated Contact with Patient 01/18/14 2038     Chief Complaint  Patient presents with  . Flank Pain     (Consider location/radiation/quality/duration/timing/severity/associated sxs/prior Treatment) HPI  53 year old female with right flank/right back pain. Gradual onset about one week ago. Progressively worsening. Pain at rest, but exacerbated by movement. Denies any current urinary symptoms but she did have some dysuria a couple weeks ago and was diagnosed with a urinary tract infection. She states she only took one dose of her prescribing antibiotic because of nausea. No fevers or chills. No vomiting. No diarrhea. Surgical hx significant for hysterectomy.   Past Medical History  Diagnosis Date  . Arthritis   . Back pain   . Anxiety   . Depression   . Allergy     SEASONAL  . Hiatal hernia   . Gastritis    Past Surgical History  Procedure Laterality Date  . Abdominal hysterectomy      with bladder tact  . Bladder tack     Family History  Problem Relation Age of Onset  . Colon cancer Paternal Uncle     x 2  . Diabetes Father    History  Substance Use Topics  . Smoking status: Current Every Day Smoker -- 1.00 packs/day    Types: Cigarettes  . Smokeless tobacco: Never Used     Comment: form given 02-12-12  . Alcohol Use: Yes     Comment: 2 per month   OB History   Grav Para Term Preterm Abortions TAB SAB Ect Mult Living                 Review of Systems  All systems reviewed and negative, other than as noted in HPI.   Allergies  Sulfa antibiotics  Home Medications   Prior to Admission medications   Medication Sig Start Date End Date Taking? Authorizing Provider  ALPRAZolam (XANAX) 0.5 MG tablet 1/2-1 tablet by mouth twice a day as needed for anxiety   Yes Historical Provider, MD  gabapentin (NEURONTIN) 100 MG capsule Take 200 mg by mouth at bedtime.   Yes Historical Provider, MD   HYDROcodone-acetaminophen (NORCO/VICODIN) 5-325 MG per tablet Take 1 tablet by mouth 2 (two) times daily as needed for moderate pain.   Yes Historical Provider, MD  ibuprofen (ADVIL,MOTRIN) 200 MG tablet Take 400 mg by mouth every 12 (twelve) hours as needed for moderate pain.   Yes Historical Provider, MD  loratadine (CLARITIN) 10 MG tablet Take 10 mg by mouth daily.   Yes Historical Provider, MD  morphine (KADIAN) 30 MG 24 hr capsule Take 30 mg by mouth 2 (two) times daily.   Yes Historical Provider, MD  pantoprazole (PROTONIX) 40 MG tablet Take 1 tablet (40 mg total) by mouth daily. 02/12/12 02/11/13  Jerene Bears, MD   BP 156/86  Pulse 78  Temp(Src) 98.5 F (36.9 C) (Oral)  Resp 20  Ht 5\' 4"  (1.626 m)  Wt 140 lb (63.504 kg)  BMI 24.02 kg/m2  SpO2 96% Physical Exam  Nursing note and vitals reviewed. Constitutional: She appears well-developed and well-nourished. No distress.  Sitting up in bed. No acute distress.  HENT:  Head: Normocephalic and atraumatic.  Eyes: Conjunctivae are normal. Right eye exhibits no discharge. Left eye exhibits no discharge.  Neck: Neck supple.  Cardiovascular: Normal rate, regular rhythm and normal heart sounds.  Exam reveals no gallop and  no friction rub.   No murmur heard. Pulmonary/Chest: Effort normal and breath sounds normal. No respiratory distress.  Abdominal: Soft. She exhibits no distension. There is tenderness.   tenderness in the right flank. No rebound or guarding. No distention. No mass.  Genitourinary:  Right CVA tenderness  Musculoskeletal: She exhibits no edema and no tenderness.  Neurological: She is alert.  Skin: Skin is warm and dry.  Psychiatric: She has a normal mood and affect. Her behavior is normal. Thought content normal.    ED Course  Procedures (including critical care time) Labs Review Labs Reviewed  URINALYSIS, ROUTINE W REFLEX MICROSCOPIC - Abnormal; Notable for the following:    APPearance CLOUDY (*)    Nitrite  POSITIVE (*)    Leukocytes, UA SMALL (*)    All other components within normal limits  CBC WITH DIFFERENTIAL - Abnormal; Notable for the following:    WBC 11.0 (*)    Lymphs Abs 4.2 (*)    All other components within normal limits  URINE MICROSCOPIC-ADD ON - Abnormal; Notable for the following:    Squamous Epithelial / LPF FEW (*)    Bacteria, UA MANY (*)    All other components within normal limits  URINE CULTURE  BASIC METABOLIC PANEL    Imaging Review No results found.   EKG Interpretation None      MDM   Final diagnoses:  Pyelonephritis    53 year old female with right flank pain for the past week. Clinically pyelonephritis. Urinalysis supports this. She is afebrile. Nontoxic. She is appropriate for outpatient treatment. Urine culture sent. Dose of Rocephin in the emergency room. Continued outpatient antibiotics. ST and pain medication. Return cautions were discussed.    Virgel Manifold, MD 01/26/14 [phone]

## 2014-01-21 LAB — URINE CULTURE: Colony Count: >=100000

## 2014-01-23 ENCOUNTER — Telehealth (HOSPITAL_BASED_OUTPATIENT_CLINIC_OR_DEPARTMENT_OTHER): Payer: Self-pay

## 2014-01-23 NOTE — Telephone Encounter (Signed)
Post ED Visit - Positive Culture Follow-up  Culture report reviewed by antimicrobial stewardship pharmacist: []  Wes [place], Pharm.D., BCPS []  Heide Guile, Pharm.D., BCPS []  Alycia Rossetti, Pharm.D., BCPS []  [place], Pharm.D., BCPS, AAHIVP []  Legrand Como, Pharm.D., BCPS, AAHIVP [x]  Hassie Bruce, Pharm.D. []  Milus Glazier, Pharm.D.  Positive Urine culture, >/= 100,000 colonies -> E Coli Treated with Cephalexin, organism sensitive to the same and no further patient follow-up is required at this time.  Dortha Kern 01/23/2014, 10:05 PM

## 2014-10-18 ENCOUNTER — Emergency Department (HOSPITAL_BASED_OUTPATIENT_CLINIC_OR_DEPARTMENT_OTHER): Payer: Commercial Managed Care - PPO

## 2014-10-18 ENCOUNTER — Encounter (HOSPITAL_BASED_OUTPATIENT_CLINIC_OR_DEPARTMENT_OTHER): Payer: Self-pay | Admitting: *Deleted

## 2014-10-18 ENCOUNTER — Emergency Department (HOSPITAL_BASED_OUTPATIENT_CLINIC_OR_DEPARTMENT_OTHER)
Admission: EM | Admit: 2014-10-18 | Discharge: 2014-10-18 | Disposition: A | Discharge status: Home / Self Care | Payer: Commercial Managed Care - PPO | Attending: Emergency Medicine | Admitting: Emergency Medicine

## 2014-10-18 DIAGNOSIS — Z8719 Personal history of other diseases of the digestive system: Secondary | ICD-10-CM | POA: Diagnosis not present

## 2014-10-18 DIAGNOSIS — R1031 Right lower quadrant pain: Secondary | ICD-10-CM | POA: Diagnosis not present

## 2014-10-18 DIAGNOSIS — R109 Unspecified abdominal pain: Secondary | ICD-10-CM

## 2014-10-18 DIAGNOSIS — Z792 Long term (current) use of antibiotics: Secondary | ICD-10-CM | POA: Insufficient documentation

## 2014-10-18 DIAGNOSIS — Z720 Tobacco use: Secondary | ICD-10-CM | POA: Insufficient documentation

## 2014-10-18 DIAGNOSIS — M1990 Unspecified osteoarthritis, unspecified site: Secondary | ICD-10-CM | POA: Insufficient documentation

## 2014-10-18 DIAGNOSIS — F419 Anxiety disorder, unspecified: Secondary | ICD-10-CM | POA: Insufficient documentation

## 2014-10-18 DIAGNOSIS — F329 Major depressive disorder, single episode, unspecified: Secondary | ICD-10-CM | POA: Insufficient documentation

## 2014-10-18 DIAGNOSIS — R1030 Lower abdominal pain, unspecified: Secondary | ICD-10-CM | POA: Diagnosis present

## 2014-10-18 DIAGNOSIS — M549 Dorsalgia, unspecified: Secondary | ICD-10-CM | POA: Insufficient documentation

## 2014-10-18 DIAGNOSIS — Z79899 Other long term (current) drug therapy: Secondary | ICD-10-CM | POA: Insufficient documentation

## 2014-10-18 LAB — COMPREHENSIVE METABOLIC PANEL WITH GFR
ALT: 15 U/L (ref 14–54)
AST: 15 U/L (ref 15–41)
Albumin: 3.7 g/dL (ref 3.5–5.0)
Alkaline Phosphatase: 83 U/L (ref 38–126)
Anion gap: 7 (ref 5–15)
BUN: 10 mg/dL (ref 6–20)
CO2: 30 mmol/L (ref 22–32)
Calcium: 8.8 mg/dL — ABNORMAL LOW (ref 8.9–10.3)
Chloride: 101 mmol/L (ref 101–111)
Creatinine, Ser: 0.53 mg/dL (ref 0.44–1.00)
GFR calc Af Amer: >60 mL/min
GFR calc non Af Amer: >60 mL/min
Glucose, Bld: 99 mg/dL (ref 65–99)
Potassium: 3.5 mmol/L (ref 3.5–5.1)
Sodium: 138 mmol/L (ref 135–145)
Total Bilirubin: 0.5 mg/dL (ref 0.3–1.2)
Total Protein: 7 g/dL (ref 6.5–8.1)

## 2014-10-18 LAB — CBC WITH DIFFERENTIAL/PLATELET
Basophils Absolute: 0 10*3/uL (ref 0.0–0.1)
Basophils Relative: 0 % (ref 0–1)
EOS ABS: 0.4 10*3/uL (ref 0.0–0.7)
Eosinophils Relative: 3 % (ref 0–5)
HCT: 40.1 % (ref 36.0–46.0)
HEMOGLOBIN: 12.9 g/dL (ref 12.0–15.0)
Lymphocytes Relative: 29 % (ref 12–46)
Lymphs Abs: 3.4 10*3/uL (ref 0.7–4.0)
MCH: 30 pg (ref 26.0–34.0)
MCHC: 32.2 g/dL (ref 30.0–36.0)
MCV: 93.3 fL (ref 78.0–100.0)
MONOS PCT: 9 % (ref 3–12)
Monocytes Absolute: 1 10*3/uL (ref 0.1–1.0)
NEUTROS PCT: 59 % (ref 43–77)
Neutro Abs: 6.9 10*3/uL (ref 1.7–7.7)
Platelets: 227 10*3/uL (ref 150–400)
RBC: 4.3 MIL/uL (ref 3.87–5.11)
RDW: 13.2 % (ref 11.5–15.5)
WBC: 11.6 10*3/uL — ABNORMAL HIGH (ref 4.0–10.5)

## 2014-10-18 LAB — URINALYSIS, ROUTINE W REFLEX MICROSCOPIC
BILIRUBIN URINE: NEGATIVE
Glucose, UA: NEGATIVE mg/dL
HGB URINE DIPSTICK: NEGATIVE
Ketones, ur: NEGATIVE mg/dL
Leukocytes, UA: NEGATIVE
Nitrite: NEGATIVE
PH: 6 (ref 5.0–8.0)
Protein, ur: NEGATIVE mg/dL
SPECIFIC GRAVITY, URINE: 1.009 (ref 1.005–1.030)
Urobilinogen, UA: 0.2 mg/dL (ref 0.0–1.0)

## 2014-10-18 LAB — LIPASE, BLOOD: Lipase: 31 U/L (ref 22–51)

## 2014-10-18 NOTE — Discharge Instructions (Signed)
Flank Pain Flank pain refers to pain that is located on the side of the body between the upper abdomen and the back. The pain may occur over a short period of time (acute) or may be long-term or reoccurring (chronic). It may be mild or severe. Flank pain can be caused by many things. CAUSES  Some of the more common causes of flank pain include:  Muscle strains.   Muscle spasms.   A disease of your spine (vertebral disk disease).   A lung infection (pneumonia).   Fluid around your lungs (pulmonary edema).   A kidney infection.   Kidney stones.   A very painful skin rash caused by the chickenpox virus (shingles).   Gallbladder disease.  [place] care will depend on the cause of your pain. In general,  Rest as directed by your caregiver.  Drink enough fluids to keep your urine clear or pale yellow.  Only take over-the-counter or prescription medicines as directed by your caregiver. Some medicines may help relieve the pain.  Tell your caregiver about any changes in your pain.  Follow up with your caregiver as directed. SEEK IMMEDIATE MEDICAL CARE IF:   Your pain is not controlled with medicine.   You have new or worsening symptoms.  Your pain increases.   You have abdominal pain.   You have shortness of breath.   You have persistent nausea or vomiting.   You have swelling in your abdomen.   You feel faint or pass out.   You have blood in your urine.  You have a fever or persistent symptoms for more than 2-3 days.  You have a fever and your symptoms suddenly get worse. MAKE SURE YOU:   Understand these instructions.  Will watch your condition.  Will get help right away if you are not doing well or get worse. Document Released: 07/12/2005 Document Revised: 02/13/2012 Document Reviewed: 01/03/2012 [facility] Patient Information 2015 [place], [place]. This information is not intended to replace advice given to you by your  health care provider. Make sure you discuss any questions you have with your health care provider.  Back Pain, Adult Low back pain is very common. About 1 in 5 people have back pain.The cause of low back pain is rarely dangerous. The pain often gets better over time.About half of people with a sudden onset of back pain feel better in just 2 weeks. About 8 in 10 people feel better by 6 weeks.  CAUSES Some common causes of back pain include:  Strain of the muscles or ligaments supporting the spine.  Wear and tear (degeneration) of the spinal discs.  Arthritis.  Direct injury to the back. DIAGNOSIS Most of the time, the direct cause of low back pain is not known.However, back pain can be treated effectively even when the exact cause of the pain is unknown.Answering your caregiver's questions about your overall health and symptoms is one of the most accurate ways to make sure the cause of your pain is not dangerous. If your caregiver needs more information, he or she may order lab work or imaging tests (X-rays or MRIs).However, even if imaging tests show changes in your back, this usually does not require surgery. HOME CARE INSTRUCTIONS For many people, back pain returns.Since low back pain is rarely dangerous, it is often a condition that people can learn to [facility] their own.   Remain active. It is stressful on the back to sit or stand in one place. Do not sit,  drive, or stand in one place for more than 30 minutes at a time. Take short walks on level surfaces as soon as pain allows.Try to increase the length of time you walk each day.  Do not stay in bed.Resting more than 1 or 2 days can delay your recovery.  Do not avoid exercise or work.Your body is made to move.It is not dangerous to be active, even though your back may hurt.Your back will likely heal faster if you return to being active before your pain is gone.  Pay attention to your body when you bend and lift. Many people  have less discomfortwhen lifting if they bend their knees, keep the load close to their bodies,and avoid twisting. Often, the most comfortable positions are those that put less stress on your recovering back.  Find a comfortable position to sleep. Use a firm mattress and lie on your side with your knees slightly bent. If you lie on your back, put a pillow under your knees.  Only take over-the-counter or prescription medicines as directed by your caregiver. Over-the-counter medicines to reduce pain and inflammation are often the most helpful.Your caregiver may prescribe muscle relaxant drugs.These medicines help dull your pain so you can more quickly return to your normal activities and healthy exercise.  Put ice on the injured area.  Put ice in a plastic bag.  Place a towel between your skin and the bag.  Leave the ice on for 15-20 minutes, 03-04 times a day for the first 2 to 3 days. After that, ice and heat may be alternated to reduce pain and spasms.  Ask your caregiver about trying back exercises and gentle massage. This may be of some benefit.  Avoid feeling anxious or stressed.Stress increases muscle tension and can worsen back pain.It is important to recognize when you are anxious or stressed and learn ways to manage it.Exercise is a great option. SEEK MEDICAL CARE IF:  You have pain that is not relieved with rest or medicine.  You have pain that does not improve in 1 week.  You have new symptoms.  You are generally not feeling well. SEEK IMMEDIATE MEDICAL CARE IF:   You have pain that radiates from your back into your legs.  You develop new bowel or bladder control problems.  You have unusual weakness or numbness in your arms or legs.  You develop nausea or vomiting.  You develop abdominal pain.  You feel faint. Document Released: 05/21/2005 Document Revised: 11/20/2011 Document Reviewed: 09/22/2013 [facility] Patient Information 2015 [place], [place]. This  information is not intended to replace advice given to you by your health care provider. Make sure you discuss any questions you have with your health care provider.   Emergency Department Resource Guide 1) Find a Doctor and Pay Out of Pocket Although you won't have to find out who is covered by your insurance plan, it is a good idea to ask around and get recommendations. You will then need to call the office and see if the doctor you have chosen will accept you as a new patient and what types of options they offer for patients who are self-pay. Some doctors offer discounts or will set up payment plans for their patients who do not have insurance, but you will need to ask so you aren't surprised when you get to your appointment.  2) Contact Your Local Health Department Not all health departments have doctors that can see patients for sick visits, but many do, so it is worth  a call to see if yours does. If you don't know where your local health department is, you can check in your phone book. The CDC also has a tool to help you locate your state's health department, and many state websites also have listings of all of their local health departments.  3) Find a Hope Clinic If your illness is not likely to be very severe or complicated, you may want to try a walk in clinic. These are popping up all over the country in pharmacies, drugstores, and shopping centers. They're usually staffed by nurse practitioners or physician assistants that have been trained to treat common illnesses and complaints. They're usually fairly quick and inexpensive. However, if you have serious medical issues or chronic medical problems, these are probably not your best option.  No Primary Care Doctor: - Call Health Connect at  [phone] - they can help you locate a primary care doctor that  accepts your insurance, provides certain services, etc. - Physician Referral Service- [phone]  Chronic Pain  Problems: Organization         Address  Phone   Notes  [facility]  [phone] Patients need to be referred by their primary care doctor.   Medication Assistance: Organization         Address  Phone   Notes  [facility] Medication [facility] [place] [address] [place], [place] [address] [phone] --Must be a resident of [facility] -- Must have NO insurance coverage whatsoever (no Medicaid/ Medicare, etc.) -- The pt. MUST have a primary care doctor that directs their care regularly and follows them in the community   MedAssist  [phone]   [employer]  [phone]    Agencies that provide inexpensive medical care: Organization         Address  Phone   Notes  [place]  [phone]   [facility]    [phone]   [facility] [place], [place] [address] [phone]   [place] [address] [place] [phone]   Planned Parenthood    [phone]   [facility]    [phone]   [place] and [address], [place] Phone:  [phone], Fax:  [phone] Hours of Operation:  9 am - 6 pm, M-F.  Also accepts Medicaid/Medicare and self-pay.  [facility] for [address], [address], [place] Phone: [phone], Fax: [phone]. Hours of Operation:  8:30 am - 5:30 pm, M-F.  Also accepts Medicaid and self-pay.  [facility] [place] [address], [place] Phone: [phone]   [place], [place], [place] [phone], Ext. [phone] Mondays & Thursdays: 7-9 AM.  First 15 patients are seen on a first come, first serve basis.    [place] Providers:  Organization         Address  Phone   Notes  [facility] [address], [place] [phone] Also  accepts self-pay patients.  [place], [place]  [phone]   [place], [address], [place] [phone]   [facility] [address] [place] [phone]   Lucianne Lei [place], [place]  7, [place]   [phone] Only accepts [place] patients after they have their name applied to their card.   Self-Pay (no insurance) in [facility]:  Organization         Address  Phone   Notes  Sickle Cell Patients, [facility] [place] [phone]   [facility] Urgent Care [place] [phone]   [facility] [place]  [place], [place],  [phone]   [facility]/Dr. Osei-Bonsu  [address], [place] or [address], [place] [phone] Phone number for both [place] and [place] locations is the same.  [facility] [address] [place] [phone]   [facility] [address] [place] or [address] [phone]   [facility] [address], [place] [phone], phone; [phone], fax Sees patients 1st and 3rd Saturday of every month.  Must not qualify for public or private insurance (i.e. Medicaid, Medicare, [place] Health Choice, Veterans' Benefits)  Household income should be no more than 200% of the poverty level The clinic cannot treat you if you are pregnant or think you are pregnant  Sexually transmitted diseases are not treated at the clinic.    Dental Care: Organization         Address  Phone  Notes  [facility] Department of [place] [facility] [place] [phone] Accepts children up to age 54 who are enrolled in [place] or [place]; pregnant  women with a Medicaid card; and children who have applied for Medicaid or [place] Health Choice, but were declined, whose parents can pay a reduced fee at time of service.  [facility] Department of [facility]  [address], [place] [phone] Accepts children up to age 16 who are enrolled in [place] or [place]; pregnant women with a Medicaid card; and children who have applied for Medicaid or [place] Health Choice, but were declined, whose parents can pay a reduced fee at time of service.  [facility] Adult Dental Access PROGRAM  [place] [phone] Patients are seen by appointment only. Walk-ins are not accepted. [place] will see patients 67 years of age and older. Monday - Tuesday (8am-5pm) Most Wednesdays (8:30-5pm) $30 per visit, cash only  [facility] Adult Dental Access PROGRAM  [address], [facility] [phone] Patients are seen by appointment only. Walk-ins are not accepted. [place] will see patients 6 years of age and older. One Wednesday Evening (Monthly: Volunteer Based).  $30 per visit, cash only  [place]  [phone] for adults; Children under age 22, call Graduate Pediatric Dentistry at [phone]. Children aged 60-14, please call [phone] to request a pediatric application.  Dental services are provided in all areas of dental care including fillings, crowns and bridges, complete and partial dentures, implants, gum treatment, root canals, and extractions. Preventive care is also provided. Treatment is provided to both adults and children. Patients are selected via a lottery and there is often a waiting list.   [facility] [address], [place]  [phone] [email or web address], [place], [place] [phone], Ext. [phone] Second and Fourth Thursday of each month,  opens at 6:30 AM; Clinic ends at 9 AM.  Patients are  seen on a first-come first-served basis, and a limited number are seen during each clinic.   [facility]  [address] Hillard Danker [place], [place] [phone]   Eligibility Requirements You must have lived in [place], [place], or [place] counties for at least the last three months.   You cannot be eligible for state or federal sponsored [employer], including [employer], [place], or [employer].   You generally cannot be eligible for healthcare insurance through your employer.    How to apply: Eligibility screenings are held every Tuesday and Wednesday afternoon from 1:00 pm until 4:00 pm. You do not need an appointment for the interview!  [facility] [address], [place], [place]   [place]  [place] Department  [place]  [phone]    Behavioral Health Resources in the Community: Intensive Outpatient Programs Organization         Address  Phone  Notes  [place]. [address] [place], [place] [phone]   [facility] Outpatient [address], [place], [place]   ADS: Alcohol & Drug Svcs [address] [place], [place]   [place] [address],  [place], [place] or [phone]   Substance Abuse Resources Organization         Address  Phone  Notes  Alcohol and Drug Services  [phone]   [place]  [phone]   [facility] [place]   Chinita Pester  [phone]   Residential & Outpatient Substance Abuse Program  [phone]   Psychological Services Organization         Address  Phone  Notes  [facility]  [place]  [phone]   [place] [address] [place] or [phone]    Mobile Crisis  Teams Organization         Address  Phone  Notes  [facility], Mobile Crisis Care Unit  [phone]   [facility]  [address] [place], [place]   [place] [address] [place] [phone]    Self-Help/Support Groups Organization         Address  Phone             Notes  [place]. of [place] - variety of support groups  [place] Call for more information  Narcotics Anonymous (NA), [facility] [address], [employer] [place]  2 meetings at this location   Special educational needs teacher         Address  Phone  Notes  ASAP Residential Treatment [place],    [place]  [phone]   [facility]  [address] [place] [phone], [place], [place]   [place], [place] [phone] Admissions: 8am-3pm M-F  Incentives Substance [place] [address]    [place], [place] [phone]   [facility] [address] Jadene Pierini [place], [place]   The [place].,  [place], [place] - Intensive Outpatient [address] Kristeen Mans [address], [place], [place]   [facility] ([place] [address],  [place], [place]  [phone] or [phone]   [facility]idential Treatment Services (RTS) [address], [place], [place] Accepts Medicaid  [facility] [place] [address]  [place] [phone] Substance Abuse/Addiction Treatment   [facility] Organization         Address  Phone  Notes  [facility]  [phone]   Domenic Schwab, PhD [address] Arlis Porta [place], [place]   [phone] or [phone]   [place], [place] [phone]   [place] [address], [place], [place] [phone]  Insurance/Medicaid/sponsorship through [facility] and Families [address]                                    [place], [place] [phone] [place] [address], [place] [phone]    Dr. Adele Schilder  [phone]   [facility] [place] Dept. 1) [address]  2) [place] 3)  [place] [address], [place] [phone]  [phone]   [place] [phone] or [phone] (After Hours)

## 2014-10-18 NOTE — ED Provider Notes (Signed)
CSN: [id number]     Arrival date & time 10/18/14  1738 History   First MD Initiated Contact with Patient 10/18/14 1841     Chief Complaint  Patient presents with  . Flank Pain     (Consider location/radiation/quality/duration/timing/severity/associated sxs/prior Treatment) HPI Patient is a 54 year-old female with pmhx of back pain, recently seen by PCP and orthopedics for a pulled muscle in her back x3 wks ago while [employer]  Pt reports a "burning sensation" in her R upper lumbar region which has been constant over the past 3 weeks with mild waxing and waning of pain.  Pt reports slight alleviation of her pain with lying flat, and no aggravating factors.  Pt states she has been scheduled for an outpt MRI for her back   Past Medical History  Diagnosis Date  . Arthritis   . Back pain   . Anxiety   . Depression   . Allergy     SEASONAL  . Hiatal hernia   . Gastritis    Past Surgical History  Procedure Laterality Date  . Abdominal hysterectomy      with bladder tact  . Bladder tack     Family History  Problem Relation Age of Onset  . Colon cancer Paternal Uncle     x 2  . Diabetes Father    History  Substance Use Topics  . Smoking status: Current Every Day Smoker -- 1.00 packs/day    Types: Cigarettes  . Smokeless tobacco: Never Used     Comment: form given 02-12-12  . Alcohol Use: Yes     Comment: 2 per month   OB History    No data available     Review of Systems  Constitutional: Negative for fever.  HENT: Negative for trouble swallowing.   Eyes: Negative for visual disturbance.  Respiratory: Negative for shortness of breath.   Cardiovascular: Negative for chest pain.  Gastrointestinal: Negative for nausea, vomiting and abdominal pain.  Genitourinary: Positive for flank pain. Negative for dysuria.  Musculoskeletal: Positive for back pain. Negative for neck pain.  Skin: Negative for rash.  Neurological: Negative for dizziness, weakness and numbness.    Psychiatric/Behavioral: Negative.       Allergies  Sulfa antibiotics  Home Medications   Prior to Admission medications   Medication Sig Start Date End Date Taking? Authorizing Provider  ALPRAZolam (XANAX) 0.5 MG tablet 1/2-1 tablet by mouth twice a day as needed for anxiety    Historical Provider, MD  cephALEXin (KEFLEX) 500 MG capsule Take 1 capsule (500 mg total) by mouth 4 (four) times daily. 01/18/14   Virgel Manifold, MD  gabapentin (NEURONTIN) 100 MG capsule Take 200 mg by mouth at bedtime.    Historical Provider, MD  HYDROcodone-acetaminophen (NORCO/VICODIN) 5-325 MG per tablet Take 1 tablet by mouth 2 (two) times daily as needed for moderate pain.    Historical Provider, MD  ibuprofen (ADVIL,MOTRIN) 200 MG tablet Take 400 mg by mouth every 12 (twelve) hours as needed for moderate pain.    Historical Provider, MD  loratadine (CLARITIN) 10 MG tablet Take 10 mg by mouth daily.    Historical Provider, MD  morphine (KADIAN) 30 MG 24 hr capsule Take 30 mg by mouth 2 (two) times daily.    Historical Provider, MD  pantoprazole (PROTONIX) 40 MG tablet Take 1 tablet (40 mg total) by mouth daily. 02/12/12 02/11/13  Jerene Bears, MD   BP 137/77 mmHg  Pulse 83  Temp(Src) 98 F (36.7  C) (Oral)  Resp 18  Ht 5\' 4"  (1.626 m)  Wt 149 lb (67.586 kg)  BMI 25.56 kg/m2  SpO2 95% Physical Exam  Constitutional: She is oriented to person, place, and time. She appears well-developed and well-nourished. No distress.  HENT:  Head: Normocephalic and atraumatic.  Mouth/Throat: Oropharynx is clear and moist. No oropharyngeal exudate.  Eyes: Right eye exhibits no discharge. Left eye exhibits no discharge. No scleral icterus.  Neck: Normal range of motion.  Cardiovascular: Normal rate, regular rhythm and normal heart sounds.   No murmur heard. Pulmonary/Chest: Effort normal and breath sounds normal. No respiratory distress.  Abdominal: Soft. There is no tenderness.  Musculoskeletal: Normal range of  motion. She exhibits no edema or tenderness.       Back:  Neurological: She is alert and oriented to person, place, and time. She has normal strength. No cranial nerve deficit or sensory deficit. She displays a negative Romberg sign. Coordination normal. GCS eye subscore is 4. GCS verbal subscore is 5. GCS motor subscore is 6.  Patient fully alert, answering questions appropriately in full, clear sentences. Cranial nerves II through XII grossly intact. Motor strength 5 out of 5 in all major muscle groups of upper and lower extremities. Distal sensation intact.   Skin: Skin is warm and dry. No rash noted. She is not diaphoretic.  Psychiatric: She has a normal mood and affect.    ED Course  Procedures (including critical care time) Labs Review Labs Reviewed  CBC WITH DIFFERENTIAL/PLATELET - Abnormal; Notable for the following:    WBC 11.6 (*)    All other components within normal limits  COMPREHENSIVE METABOLIC PANEL - Abnormal; Notable for the following:    Calcium 8.8 (*)    All other components within normal limits  URINALYSIS, ROUTINE W REFLEX MICROSCOPIC  LIPASE, BLOOD    Imaging Review    CT RENAL STONE STUDY (Final result) Result time: 10/18/14 22:11:14   Final result by Rad Results In Interface (10/18/14 22:11:14)   Narrative:   CLINICAL DATA: 54 year old female with 3 week history of right flank pain  EXAM: CT ABDOMEN AND PELVIS WITHOUT CONTRAST  TECHNIQUE: Multidetector CT imaging of the abdomen and pelvis was performed following the standard protocol without IV contrast.  COMPARISON: Prior CT scan of the abdomen and pelvis 04/15/2003  FINDINGS: Lower Chest: The lung bases are clear. Visualized cardiac structures are within normal limits for size. No pericardial effusion. Unremarkable visualized distal thoracic esophagus.  Abdomen: Unenhanced CT was performed per clinician order. Lack of IV contrast limits sensitivity and specificity, especially  for evaluation of abdominal/pelvic solid viscera. Within these limitations, unremarkable CT appearance of the stomach, duodenum, spleen, adrenal glands and pancreas. Normal hepatic contour and morphology. Nose discrete lesion. Gallbladder is unremarkable. No intra or extrahepatic biliary ductal dilatation.  3 mm nonobstructing stone in the interpolar right renal collecting system. No additional nephrolithiasis identified. No hydronephrosis.  Colonic diverticular disease without CT evidence of active inflammation. Normal appendix in the right lower quadrant. No evidence of a bowel obstruction. No free fluid or suspicious adenopathy. Small fat containing periumbilical hernia.  Pelvis: Surgical changes of prior hysterectomy. No suspicious adenopathy or free fluid.  Bones/Soft Tissues: No acute fracture or aggressive appearing lytic or blastic osseous lesion.  Vascular: Limited evaluation in the absence of intravenous contrast. Atherosclerotic vascular disease without significant stenosis or aneurysmal dilatation.  IMPRESSION: 1. Nonobstructing right nephrolithiasis. There is a single 3 mm stone in the interpolar collecting system. 2. Colonic  diverticular disease without CT evidence of active inflammation. 3. Small fat containing periumbilical hernia. 4. Atherosclerotic vascular calcifications.   Electronically Signed By: Jacqulynn Cadet M.D. On: 10/18/2014 22:11     EKG Interpretation None      MDM   Final diagnoses:  Right flank pain    3 weeks of right-sided back pain, constant, burning sensation no aggrevating/alleviating factors. Seen by PCP after patient reported "pulling her back" while [employer] Seen by back specialists, scheduled for MRI, specialists want to do steroid injections. Patient concerned that nobody has evaluated her kidneys, states she had similar flank pain with previous kidney infection. UA today negative. Will follow with blood work. No  nausea, vomiting, diarrhea, dysuria, fever, and abdominal pain.  Pt hemodynamically stable and in no acute distress.  No concern for ACS or PE.  Wells criteria 0.   Pt offered pain management, however does not feel pain is severe enough to warrant it.  Pain is mildly reproducible on exam, likely musculoskeletal.  Due to vague, unchanging pain, and previous pain related to kidneys, pt f/u with CT abd pelv which does not show evidence of acute pathology or obstructive ureterolithiasis.  Pt stable for discharge to f/u with PCP for further evaluation.  Return precautions discussed, pt verbalized understanding and agreement of this plan.    BP 137/77 mmHg  Pulse 83  Temp(Src) 98 F (36.7 C) (Oral)  Resp 18  Ht 5\' 4"  (1.626 m)  Wt 149 lb (67.586 kg)  BMI 25.56 kg/m2  SpO2 95%  Signed,  Dahlia Bailiff, PA-C 4:06 PM  Patient discussed with Dr. Charlesetta Shanks, MD     Dahlia Bailiff, PA-C 10/21/14 1607  Charlesetta Shanks, MD 10/26/14 1455

## 2014-10-18 NOTE — ED Notes (Addendum)
Pt c/o right flank pain x 3 weeks , seen by PMD Dx pulled muscle chronic back pain

## 2014-10-21 ENCOUNTER — Other Ambulatory Visit: Payer: Self-pay | Admitting: Physical Medicine and Rehabilitation

## 2014-10-21 DIAGNOSIS — M545 Low back pain: Secondary | ICD-10-CM

## 2014-10-27 ENCOUNTER — Ambulatory Visit
Admission: RE | Admit: 2014-10-27 | Discharge: 2014-10-27 | Disposition: A | Discharge status: Home / Self Care | Payer: Commercial Managed Care - PPO | Source: Ambulatory Visit | Attending: Physical Medicine and Rehabilitation | Admitting: Physical Medicine and Rehabilitation

## 2014-10-27 DIAGNOSIS — M545 Low back pain: Secondary | ICD-10-CM

## 2015-04-19 ENCOUNTER — Emergency Department (HOSPITAL_COMMUNITY)
Admission: EM | Admit: 2015-04-19 | Discharge: 2015-04-19 | Disposition: A | Discharge status: Home / Self Care | Payer: Commercial Managed Care - PPO | Attending: Emergency Medicine | Admitting: Emergency Medicine

## 2015-04-19 ENCOUNTER — Emergency Department (HOSPITAL_COMMUNITY): Payer: Commercial Managed Care - PPO

## 2015-04-19 ENCOUNTER — Encounter (HOSPITAL_COMMUNITY): Payer: Self-pay

## 2015-04-19 DIAGNOSIS — Y9289 Other specified places as the place of occurrence of the external cause: Secondary | ICD-10-CM | POA: Insufficient documentation

## 2015-04-19 DIAGNOSIS — F329 Major depressive disorder, single episode, unspecified: Secondary | ICD-10-CM | POA: Insufficient documentation

## 2015-04-19 DIAGNOSIS — F17210 Nicotine dependence, cigarettes, uncomplicated: Secondary | ICD-10-CM | POA: Insufficient documentation

## 2015-04-19 DIAGNOSIS — Z8719 Personal history of other diseases of the digestive system: Secondary | ICD-10-CM | POA: Insufficient documentation

## 2015-04-19 DIAGNOSIS — Y9389 Activity, other specified: Secondary | ICD-10-CM | POA: Insufficient documentation

## 2015-04-19 DIAGNOSIS — Z79891 Long term (current) use of opiate analgesic: Secondary | ICD-10-CM | POA: Diagnosis not present

## 2015-04-19 DIAGNOSIS — Z79899 Other long term (current) drug therapy: Secondary | ICD-10-CM | POA: Diagnosis not present

## 2015-04-19 DIAGNOSIS — F419 Anxiety disorder, unspecified: Secondary | ICD-10-CM | POA: Insufficient documentation

## 2015-04-19 DIAGNOSIS — M542 Cervicalgia: Secondary | ICD-10-CM

## 2015-04-19 DIAGNOSIS — S161XXA Strain of muscle, fascia and tendon at neck level, initial encounter: Secondary | ICD-10-CM | POA: Insufficient documentation

## 2015-04-19 DIAGNOSIS — Y998 Other external cause status: Secondary | ICD-10-CM | POA: Insufficient documentation

## 2015-04-19 DIAGNOSIS — X58XXXA Exposure to other specified factors, initial encounter: Secondary | ICD-10-CM | POA: Insufficient documentation

## 2015-04-19 DIAGNOSIS — M1990 Unspecified osteoarthritis, unspecified site: Secondary | ICD-10-CM | POA: Insufficient documentation

## 2015-04-19 DIAGNOSIS — S199XXA Unspecified injury of neck, initial encounter: Secondary | ICD-10-CM | POA: Diagnosis present

## 2015-04-19 NOTE — ED Provider Notes (Signed)
CSN: [id number]     Arrival date & time 04/19/15  1751 History   First MD Initiated Contact with Patient 04/19/15 1955     Chief Complaint  Patient presents with  . Neck Pain  . Shoulder Pain     (Consider location/radiation/quality/duration/timing/severity/associated sxs/prior Treatment) HPI Comments: 54 year old female with a history of arthritis, back pain, anxiety, and depression presents to the emergency department for evaluation of neck pain. She states that she awoke from sleep 3 weeks ago and felt as though she had slept wrong on her neck. She reports a mild aching pain which has worsened over the past 3 weeks. Pain is worse with movement of her neck and slightly aggravated with movement of her left arm. She reports that she has been taking her daily Vicodin and morphine tablets as well as her muscle relaxer without relief. She has also tried alternating ice and heat to the area. Patient is followed by pain management. She reports a slight decreased sensation over her left collarbone. She has had no complete numbness or weakness in her extremities. No fever.  Patient is a 54 y.o. female presenting with neck pain and shoulder pain. The history is provided by the patient. No language interpreter was used.  Neck Pain Associated symptoms: no fever   Shoulder Pain Associated symptoms: neck pain   Associated symptoms: no back pain and no fever     Past Medical History  Diagnosis Date  . Arthritis   . Back pain   . Anxiety   . Depression   . Allergy     SEASONAL  . Hiatal hernia   . Gastritis    Past Surgical History  Procedure Laterality Date  . Abdominal hysterectomy      with bladder tact  . Bladder tack     Family History  Problem Relation Age of Onset  . Colon cancer Paternal Uncle     x 2  . Diabetes Father    Social History  Substance Use Topics  . Smoking status: Current Every Day Smoker -- 1.00 packs/day    Types: Cigarettes  . Smokeless tobacco: Never Used      Comment: form given 02-12-12  . Alcohol Use: Yes     Comment: 3 drinks a week   OB History    No data available      Review of Systems  Constitutional: Negative for fever.  Musculoskeletal: Positive for myalgias and neck pain. Negative for back pain and arthralgias.  All other systems reviewed and are negative.   Allergies  Sulfa antibiotics  Home Medications   Prior to Admission medications   Medication Sig Start Date End Date Taking? Authorizing Provider  ALPRAZolam (XANAX) 0.5 MG tablet 1/2-1 tablet by mouth twice a day as needed for anxiety   Yes Historical Provider, MD  gabapentin (NEURONTIN) 100 MG capsule Take 200 mg by mouth at bedtime.   Yes Historical Provider, MD  HYDROcodone-acetaminophen (NORCO/VICODIN) 5-325 MG per tablet Take 1 tablet by mouth 2 (two) times daily as needed for moderate pain.   Yes Historical Provider, MD  ibuprofen (ADVIL,MOTRIN) 200 MG tablet Take 400 mg by mouth every 12 (twelve) hours as needed for moderate pain.   Yes Historical Provider, MD  loratadine (CLARITIN) 10 MG tablet Take 10 mg by mouth daily.   Yes Historical Provider, MD  morphine (KADIAN) 30 MG 24 hr capsule Take 30 mg by mouth 2 (two) times daily.   Yes Historical Provider, MD  tiZANidine (ZANAFLEX) 2  MG tablet Take 2 mg by mouth 3 (three) times daily.   Yes Historical Provider, MD  cephALEXin (KEFLEX) 500 MG capsule Take 1 capsule (500 mg total) by mouth 4 (four) times daily. Patient not taking: Reported on 04/19/2015 01/18/14   Virgel Manifold, MD  pantoprazole (PROTONIX) 40 MG tablet Take 1 tablet (40 mg total) by mouth daily. Patient not taking: Reported on 04/19/2015 02/12/12 02/11/13  Jerene Bears, MD   BP 179/89 mmHg  Pulse 84  Temp(Src) 98 F (36.7 C) (Oral)  Resp 16  SpO2 95%   Physical Exam  Constitutional: She is oriented to person, place, and time. She appears well-developed and well-nourished. No distress.  Nontoxic/nonseptic appearing  HENT:  Head:  Normocephalic and atraumatic.  Eyes: Conjunctivae and EOM are normal. No scleral icterus.  Neck: Normal range of motion.  Good range of motion of the neck. No bony deformities, step-offs, or crepitus to the cervical midline. There is tenderness to palpation to the left superior cervical paraspinal muscles without spasm.  Cardiovascular: Normal rate, regular rhythm and intact distal pulses.   Distal radial pulse 2+ bilaterally  Pulmonary/Chest: Effort normal. No respiratory distress.  Musculoskeletal: Normal range of motion. She exhibits tenderness.  Cervical paraspinal muscle tenderness. Normal range of motion of bilateral upper extremities.  Neurological: She is alert and oriented to person, place, and time. She exhibits normal muscle tone. Coordination normal.  Grip strength equal bilaterally. Sensation to light touch intact bilaterally. GCS 15.  Skin: Skin is warm and dry. No rash noted. She is not diaphoretic. No erythema. No pallor.  Psychiatric: She has a normal mood and affect. Her behavior is normal.  Nursing note and vitals reviewed.   ED Course  Procedures (including critical care time) Labs Review Labs Reviewed - No data to display  Imaging Review Ct Cervical Spine Wo Contrast  04/19/2015  CLINICAL DATA:  Left-sided neck and shoulder pain for 3 weeks. EXAM: CT CERVICAL SPINE WITHOUT CONTRAST TECHNIQUE: Multidetector CT imaging of the cervical spine was performed without intravenous contrast. Multiplanar CT image reconstructions were also generated. COMPARISON:  None. FINDINGS: Cervical spine alignment is maintained. Vertebral body heights are preserved. There is no fracture. The dens is intact. None fusion posterior elements of C1, a normal variant. There are no jumped or perched facets. There is mild disc space narrowing at C6-C7. Multilevel anterior posterior endplate spurs. Mild neural foraminal stenosis on the right at C3-C4. No central canal stenosis. No suspicious osseous  lesion. Small bone island in C2. No prevertebral soft tissue edema. Mild paraseptal emphysema of the lung apices. IMPRESSION: Mild multilevel degenerative disc disease. No acute bony abnormality. Electronically Signed   By: Jeb Levering M.D.   On: 04/19/2015 22:32     I have personally reviewed and evaluated these images and lab results as part of my medical decision-making.   EKG Interpretation None      2150 - Patient wishing to leave to smoke a cigarette. Informed patient that she is not able to leave the ED; if she does she risks needing to start the process of evaluation over again and may need to wait again to be seen by a provider. Patient states that "I will not be more than 5 or 10 minutes". I, again, informed her of the risk of needing to wait. She states, "If that happens that's okay. I'll just go home and go see my back doctor". MDM   Final diagnoses:  Neck pain on left side  Strain  of neck muscle, initial encounter    54 year old female presents to the emergency department for evaluation of persistent left neck and shoulder pain 3 weeks. Patient is neurovascularly intact. She has good range of motion of her cervical spine. No bony deformities, crepitus, step offs noted on exam. CT obtained which shows mild degenerative disc disease, multilevel. No evidence of acute injury or bony deformity. No evidence of severe ligamentous injury.  Patient to be discharged with instruction of follow-up with an orthopedist as well as her primary care doctor. She is part of pain management and, therefore, will not be discharged with any additional pain prescriptions. Return precautions discussed and provided. Patient agreeable to plan with no unaddressed concerns. Patient discharged in good condition.   Filed Vitals:   04/19/15 1756  BP: 179/89  Pulse: 84  Temp: 98 F (36.7 C)  TempSrc: Oral  Resp: 16  SpO2: 95%     Antonietta Breach, PA-C 04/19/15 [place], MD 04/20/15  0000

## 2015-04-19 NOTE — Discharge Instructions (Signed)
Cervical Sprain A cervical sprain is an injury in the neck in which the strong, fibrous tissues (ligaments) that connect your neck bones stretch or tear. Cervical sprains can range from mild to severe. Severe cervical sprains can cause the neck vertebrae to be unstable. This can lead to damage of the spinal cord and can result in serious nervous system problems. The amount of time it takes for a cervical sprain to get better depends on the cause and extent of the injury. Most cervical sprains heal in 1 to 3 weeks. CAUSES  Severe cervical sprains may be caused by:   Contact sport injuries (such as from football, rugby, wrestling, hockey, auto racing, gymnastics, diving, martial arts, or boxing).   Motor vehicle collisions.   Whiplash injuries. This is an injury from a sudden forward and backward whipping movement of the head and neck.  Falls.  Mild cervical sprains may be caused by:   Being in an awkward position, such as while cradling a telephone between your ear and shoulder.   Sitting in a chair that does not offer proper support.   Working at a poorly Landscape [employer] station.   Looking up or down for long periods of time.  SYMPTOMS   Pain, soreness, stiffness, or a burning sensation in the front, back, or sides of the neck. This discomfort may develop immediately after the injury or slowly, 24 hours or more after the injury.   Pain or tenderness directly in the middle of the back of the neck.   Shoulder or upper back pain.   Limited ability to move the neck.   Headache.   Dizziness.   Weakness, numbness, or tingling in the hands or arms.   Muscle spasms.   Difficulty swallowing or chewing.   Tenderness and swelling of the neck.  DIAGNOSIS  Most of the time your health care provider can diagnose a cervical sprain by taking your history and doing a physical exam. Your health care provider will ask about previous neck injuries and any known neck  problems, such as arthritis in the neck. X-rays may be taken to find out if there are any other problems, such as with the bones of the neck. Other tests, such as a CT scan or MRI, may also be needed.  TREATMENT  Treatment depends on the severity of the cervical sprain. Mild sprains can be treated with rest, keeping the neck in place (immobilization), and pain medicines. Severe cervical sprains are immediately immobilized. Further treatment is done to help with pain, muscle spasms, and other symptoms and may include:  Medicines, such as pain relievers, numbing medicines, or muscle relaxants.   Physical therapy. This may involve stretching exercises, strengthening exercises, and posture training. Exercises and improved posture can help stabilize the neck, strengthen muscles, and help stop symptoms from returning.  HOME CARE INSTRUCTIONS   Put ice on the injured area.   Put ice in a plastic bag.   Place a towel between your skin and the bag.   Leave the ice on for 15-20 minutes, 3-4 times a day.   If your injury was severe, you may have been given a cervical collar to wear. A cervical collar is a two-piece collar designed to keep your neck from moving while it heals.  Do not remove the collar unless instructed by your health care provider.  If you have long hair, keep it outside of the collar.  Ask your health care provider before making any adjustments to your collar. Minor  adjustments may be required over time to improve comfort and reduce pressure on your chin or on the back of your head.  Ifyou are allowed to remove the collar for cleaning or bathing, follow your health care provider's instructions on how to do so safely.  Keep your collar clean by wiping it with mild soap and water and drying it completely. If the collar you have been given includes removable pads, remove them every 1-2 days and hand wash them with soap and water. Allow them to air dry. They should be completely  dry before you wear them in the collar.  If you are allowed to remove the collar for cleaning and bathing, wash and dry the skin of your neck. Check your skin for irritation or sores. If you see any, tell your health care provider.  Do not drive while wearing the collar.   Only take over-the-counter or prescription medicines for pain, discomfort, or fever as directed by your health care provider.   Keep all follow-up appointments as directed by your health care provider.   Keep all physical therapy appointments as directed by your health care provider.   Make any needed adjustments to your workstation to promote good posture.   Avoid positions and activities that make your symptoms worse.   Warm up and stretch before being active to help prevent problems.  SEEK MEDICAL CARE IF:   Your pain is not controlled with medicine.   You are unable to decrease your pain medicine over time as planned.   Your activity level is not improving as expected.  SEEK IMMEDIATE MEDICAL CARE IF:   You develop any bleeding.  You develop stomach upset.  You have signs of an allergic reaction to your medicine.   Your symptoms get worse.   You develop new, unexplained symptoms.   You have numbness, tingling, weakness, or paralysis in any part of your body.  MAKE SURE YOU:   Understand these instructions.  Will watch your condition.  Will get help right away if you are not doing well or get worse.   This information is not intended to replace advice given to you by your health care provider. Make sure you discuss any questions you have with your health care provider.   Document Released: 03/18/2007 Document Revised: 05/26/2013 Document Reviewed: 11/26/2012 Elsevier Interactive Patient Education 2016 [employer]. Acute Torticollis Torticollis is a condition in which the muscles of the neck tighten (contract) abnormally, causing the neck to twist and the head to move into an  unnatural position. Torticollis that develops suddenly is called acute torticollis. If torticollis becomes chronic and is left untreated, the face and neck can become deformed. CAUSES This condition may be caused by:  Sleeping in an awkward position (common).  Extending or twisting the neck muscles beyond their normal position.  Infection. In some cases, the cause may not be known. SYMPTOMS Symptoms of this condition include:  An unnatural position of the head.  Neck pain.  A limited ability to move the neck.  Twisting of the neck to one side. DIAGNOSIS This condition is diagnosed with a physical exam. You may also have imaging tests, such as an X-ray, CT scan, or MRI. TREATMENT Treatment for this condition involves trying to relax the neck muscles. It may include:  Medicines or shots.  Physical therapy.  Surgery. This may be done in severe cases. HOME CARE INSTRUCTIONS  Take medicines only as directed by your health care provider.  Do stretching exercises and  massage your neck as directed by your health care provider.  Keep all follow-up visits as directed by your health care provider. This is important. SEEK MEDICAL CARE IF:  You develop a fever. SEEK IMMEDIATE MEDICAL CARE IF:  You develop difficulty breathing.  You develop noisy breathing (stridor).  You start drooling.  You have trouble swallowing or have pain with swallowing.  You develop numbness or weakness in your hands or feet.  You have changes in your speech, understanding, or vision.  Your pain gets worse.   This information is not intended to replace advice given to you by your health care provider. Make sure you discuss any questions you have with your health care provider.   Document Released: 05/18/2000 Document Revised: 10/05/2014 Document Reviewed: 05/17/2014 Elsevier Interactive Patient Education [employer].

## 2015-04-19 NOTE — ED Notes (Signed)
Patient c/o left neck and left shoulder pain x 3 weeks. Patient states she has tried heat and ice, neck exercises, and salonpas with no relief.

## 2015-08-09 ENCOUNTER — Emergency Department (HOSPITAL_COMMUNITY): Payer: Commercial Managed Care - PPO

## 2015-08-09 ENCOUNTER — Emergency Department (HOSPITAL_COMMUNITY)
Admission: EM | Admit: 2015-08-09 | Discharge: 2015-08-09 | Disposition: A | Discharge status: Home / Self Care | Payer: Commercial Managed Care - PPO | Attending: Physician Assistant | Admitting: Physician Assistant

## 2015-08-09 ENCOUNTER — Encounter (HOSPITAL_COMMUNITY): Payer: Self-pay | Admitting: Emergency Medicine

## 2015-08-09 DIAGNOSIS — F419 Anxiety disorder, unspecified: Secondary | ICD-10-CM | POA: Insufficient documentation

## 2015-08-09 DIAGNOSIS — J441 Chronic obstructive pulmonary disease with (acute) exacerbation: Secondary | ICD-10-CM | POA: Diagnosis not present

## 2015-08-09 DIAGNOSIS — F329 Major depressive disorder, single episode, unspecified: Secondary | ICD-10-CM | POA: Diagnosis not present

## 2015-08-09 DIAGNOSIS — M1990 Unspecified osteoarthritis, unspecified site: Secondary | ICD-10-CM | POA: Diagnosis not present

## 2015-08-09 DIAGNOSIS — K2970 Gastritis, unspecified, without bleeding: Secondary | ICD-10-CM | POA: Diagnosis not present

## 2015-08-09 DIAGNOSIS — R062 Wheezing: Secondary | ICD-10-CM | POA: Diagnosis present

## 2015-08-09 DIAGNOSIS — Z79899 Other long term (current) drug therapy: Secondary | ICD-10-CM | POA: Insufficient documentation

## 2015-08-09 DIAGNOSIS — F17210 Nicotine dependence, cigarettes, uncomplicated: Secondary | ICD-10-CM | POA: Insufficient documentation

## 2015-08-09 DIAGNOSIS — Z79891 Long term (current) use of opiate analgesic: Secondary | ICD-10-CM | POA: Diagnosis not present

## 2015-08-09 MED ORDER — ALBUTEROL SULFATE HFA 108 (90 BASE) MCG/ACT IN AERS: 1.0000 | INHALATION_SPRAY | Freq: Four times a day (QID) | RESPIRATORY_TRACT | Status: DC | PRN

## 2015-08-09 MED ORDER — PREDNISONE 20 MG PO TABS: 60.0000 mg | ORAL_TABLET | Freq: Every day | ORAL | Status: DC

## 2015-08-09 MED ORDER — AZITHROMYCIN 250 MG PO TABS: 250.0000 mg | ORAL_TABLET | Freq: Every day | ORAL | Status: DC

## 2015-08-09 NOTE — Discharge Instructions (Signed)
[name] Valerie Key,  Nice meeting you! Please follow-up with your primary care provider. Return to the emergency department if you develop increased shortness of breath, chest pain, or do not improve within a few days. Feel better soon!  S. Wendie Simmer, PA-C   Asthma, Adult Asthma is a condition of the lungs in which the airways tighten and narrow. Asthma can make it hard to breathe. Asthma cannot be cured, but medicine and lifestyle changes can help control it. Asthma may be started (triggered) by:  Animal skin flakes (dander).  Dust.  Cockroaches.  Pollen.  Mold.  Smoke.  Cleaning products.  Hair sprays or aerosol sprays.  Paint fumes or strong smells.  Cold air, weather changes, and winds.  Crying or laughing hard.  Stress.  Certain medicines or drugs.  Foods, such as dried fruit, potato chips, and sparkling grape juice.  Infections or conditions (colds, flu).  Exercise.  Certain medical conditions or diseases.  Exercise or tiring activities. HOME CARE   Take medicine as told by your doctor.  Use a peak flow meter as told by your doctor. A peak flow meter is a tool that measures how well the lungs are working.  Record and keep track of the peak flow meter's readings.  Understand and use the asthma action plan. An asthma action plan is a written plan for taking care of your asthma and treating your attacks.  To help prevent asthma attacks:  Do not smoke. Stay away from secondhand smoke.  Change your heating and air conditioning filter often.  Limit your use of fireplaces and wood stoves.  Get rid of pests (such as roaches and mice) and their droppings.  Throw away plants if you see mold on them.  Clean your floors. Dust regularly. Use cleaning products that do not smell.  Have someone vacuum when you are not home. Use a vacuum cleaner with a HEPA filter if possible.  Replace carpet with wood, tile, or vinyl flooring. Carpet can  trap animal skin flakes and dust.  Use allergy-proof pillows, mattress covers, and box spring covers.  Wash bed sheets and blankets every week in hot water and dry them in a dryer.  Use blankets that are made of polyester or cotton.  Clean bathrooms and kitchens with bleach. If possible, have someone repaint the walls in these rooms with mold-resistant paint. Keep out of the rooms that are being cleaned and painted.  Wash hands often. GET HELP IF:  You have make a whistling sound when breaking (wheeze), have shortness of breath, or have a cough even if taking medicine to prevent attacks.  The colored mucus you cough up (sputum) is thicker than usual.  The colored mucus you cough up changes from clear or white to yellow, green, gray, or bloody.  You have problems from the medicine you are taking such as:  A rash.  Itching.  Swelling.  Trouble breathing.  You need reliever medicines more than 2-3 times a week.  Your peak flow measurement is still at 50-79% of your personal best after following the action plan for 1 hour.  You have a fever. GET HELP RIGHT AWAY IF:   You seem to be worse and are not responding to medicine during an asthma attack.  You are short of breath even at rest.  You get short of breath when doing very little activity.  You have trouble eating, drinking, or talking.  You have chest pain.  You have a fast heartbeat.  Your lips or fingernails start to turn blue.  You are light-headed, dizzy, or faint.  Your peak flow is less than 50% of your personal best.   This information is not intended to replace advice given to you by your health care provider. Make sure you discuss any questions you have with your health care provider.   Document Released: 11/07/2007 Document Revised: 02/09/2015 Document Reviewed: 12/18/2012 Elsevier Interactive Patient Education [employer].

## 2015-08-09 NOTE — ED Notes (Signed)
Pt states she went to her PCP to get an inhaler due to "cold" for a few weeks. Pt was given 3 breathing treatments and a shot of steroids. Sent here due to increased wheezing and saturations 88% at [facility] office. Pt in NAD at this time, respir e/u. Wheezing noted. Saturations 91% on RA

## 2015-08-09 NOTE — ED Provider Notes (Signed)
CSN: [id number]     Arrival date & time 08/09/15  1829 History  By signing my name below, I, Valerie Key, attest that this documentation has been prepared under the direction and in the presence of S. Wendie Simmer, PA-C Electronically Signed: Soijett Key, ED Scribe. 08/09/2015. 7:15 PM.    Chief Complaint  Patient presents with  . Asthma  . Wheezing     The history is provided by the patient. No language interpreter was used.    Valerie Key is a 55 y.o. female with a medical hx of asthma and seasonal allergies who presents to the Emergency Department complaining of SOB onset 2 days. Pt notes that she went to her PCP to get an inhaler due to a cold that she had. Pt was given 3 breathing treatments along with a steroid injection without a CXR. Pt was referred to the ED due to her having increased wheezing and low O2 sats. Pt is having associated symptoms of wheezing, non-productive cough, sneezing, and nasal congestion. She denies CP, abdominal pain, n/v, and any other symptoms. Pt reports that she is a current smoker.    Past Medical History  Diagnosis Date  . Arthritis   . Back pain   . Anxiety   . Depression   . Allergy     SEASONAL  . Hiatal hernia   . Gastritis    Past Surgical History  Procedure Laterality Date  . Abdominal hysterectomy      with bladder tact  . Bladder tack     Family History  Problem Relation Age of Onset  . Colon cancer Paternal Uncle     x 2  . Diabetes Father    Social History  Substance Use Topics  . Smoking status: Current Every Day Smoker -- 1.00 packs/day    Types: Cigarettes  . Smokeless tobacco: Never Used     Comment: form given 02-12-12  . Alcohol Use: Yes     Comment: 3 drinks a week   OB History    No data available     Review of Systems  HENT: Positive for congestion and sneezing.   Respiratory: Positive for cough and wheezing.   Cardiovascular: Negative for chest pain.  Gastrointestinal: Negative for  nausea, vomiting and abdominal pain.      Allergies  Sulfa antibiotics  Home Medications   Prior to Admission medications   Medication Sig Start Date End Date Taking? Authorizing Provider  ALPRAZolam (XANAX) 0.5 MG tablet 1/2-1 tablet by mouth twice a day as needed for anxiety    Historical Provider, MD  cephALEXin (KEFLEX) 500 MG capsule Take 1 capsule (500 mg total) by mouth 4 (four) times daily. Patient not taking: Reported on 04/19/2015 01/18/14   Virgel Manifold, MD  gabapentin (NEURONTIN) 100 MG capsule Take 200 mg by mouth at bedtime.    Historical Provider, MD  HYDROcodone-acetaminophen (NORCO/VICODIN) 5-325 MG per tablet Take 1 tablet by mouth 2 (two) times daily as needed for moderate pain.    Historical Provider, MD  ibuprofen (ADVIL,MOTRIN) 200 MG tablet Take 400 mg by mouth every 12 (twelve) hours as needed for moderate pain.    Historical Provider, MD  loratadine (CLARITIN) 10 MG tablet Take 10 mg by mouth daily.    Historical Provider, MD  morphine (KADIAN) 30 MG 24 hr capsule Take 30 mg by mouth 2 (two) times daily.    Historical Provider, MD  pantoprazole (PROTONIX) 40 MG tablet Take 1 tablet (40 mg total)  by mouth daily. Patient not taking: Reported on 04/19/2015 02/12/12 02/11/13  Jerene Bears, MD  tiZANidine (ZANAFLEX) 2 MG tablet Take 2 mg by mouth 3 (three) times daily.    Historical Provider, MD   BP 164/84 mmHg  Pulse 105  Temp(Src) 98.2 F (36.8 C) (Oral)  Resp 20  SpO2 92% Physical Exam  Constitutional: She is oriented to person, place, and time. She appears well-developed and well-nourished. No distress.  HENT:  Head: Normocephalic and atraumatic.  Eyes: EOM are normal.  Neck: Neck supple.  Cardiovascular: Normal rate, regular rhythm and normal heart sounds.  Exam reveals no gallop and no friction rub.   No murmur heard. Pulmonary/Chest: Effort normal. No respiratory distress. She has no wheezes. She has rhonchi. She has no rales.  Rhonchi noted more  prominent in the right lower lobe.  Musculoskeletal: Normal range of motion.  Neurological: She is alert and oriented to person, place, and time.  Skin: Skin is warm and dry.  Psychiatric: She has a normal mood and affect. Her behavior is normal.  Nursing note and vitals reviewed.   ED Course  Procedures  DIAGNOSTIC STUDIES: Oxygen Saturation is 92% on RA, low by my interpretation.    COORDINATION OF CARE: 7:12 PM Discussed treatment plan with pt at bedside which includes CXR and pt agreed to plan.  Imaging Review Dg Chest 2 View  08/09/2015  CLINICAL DATA:  Productive cough x 2 weeks (clear) with SOB x 2 days. Hx of asthma. Smoker of 1 pk x 15 years. EXAM: CHEST  2 VIEW COMPARISON:  None. FINDINGS: Normal heart, mediastinum and hila. Lungs are hyperexpanded but clear. No pleural effusion or pneumothorax. Bony thorax is intact. IMPRESSION: 1. No acute cardiopulmonary disease. 2. Hyperexpanded lungs. Electronically Signed   By: Lajean Manes M.D.   On: 08/09/2015 19:43   I have personally reviewed and evaluated these images as part of my medical decision-making.   MDM   Final diagnoses:  COPD exacerbation ([place])   CXR negative for acute change. Patient ambulated and sats averaged 89% (NT feels there is baseline 2% difference b/t dynamap and room pulse ox).  Patient O2 sat at rest in ED are 93%-95%. Patient is not feeling SOB, feels better, and is requesting to leave since O2 sats are not as concerning in ED. Patient may be safely discharged home with inhaler, steroids, z-pack. Discussed reasons for return. Patient to follow-up with primary care provider within one week. Patient in understanding and agreement with the plan.  I personally performed the services described in this documentation, which was scribed in my presence. The recorded information has been reviewed and is accurate.    [place] Lions, PA-C 08/11/15 2309  Courteney Julio Alm, MD 08/13/15 1048

## 2015-08-09 NOTE — ED Notes (Signed)
Ambulated pt around fast track... Upon leaving the room pulse ox read 89% and stayed there the whole time. Once we got back to room I put pt back on pulse ox in room and it red 91%. RN Horris Latino notified

## 2016-12-24 ENCOUNTER — Emergency Department (HOSPITAL_COMMUNITY): Payer: Commercial Managed Care - PPO

## 2016-12-24 ENCOUNTER — Emergency Department (HOSPITAL_COMMUNITY)
Admission: EM | Admit: 2016-12-24 | Discharge: 2016-12-24 | Disposition: A | Discharge status: Home / Self Care | Payer: Commercial Managed Care - PPO | Attending: Emergency Medicine | Admitting: Emergency Medicine

## 2016-12-24 ENCOUNTER — Encounter (HOSPITAL_COMMUNITY): Payer: Self-pay | Admitting: Emergency Medicine

## 2016-12-24 DIAGNOSIS — R0789 Other chest pain: Secondary | ICD-10-CM | POA: Insufficient documentation

## 2016-12-24 DIAGNOSIS — F17210 Nicotine dependence, cigarettes, uncomplicated: Secondary | ICD-10-CM | POA: Diagnosis not present

## 2016-12-24 DIAGNOSIS — G8929 Other chronic pain: Secondary | ICD-10-CM

## 2016-12-24 DIAGNOSIS — Z79899 Other long term (current) drug therapy: Secondary | ICD-10-CM | POA: Diagnosis not present

## 2016-12-24 DIAGNOSIS — M549 Dorsalgia, unspecified: Secondary | ICD-10-CM | POA: Insufficient documentation

## 2016-12-24 NOTE — ED Triage Notes (Signed)
Pt c/o low back pain radiating into right side, worse with position change onset Saturday. Had metal branch block injection to spine on Friday for back pain. Ambulatory. No bowel or bladder changes.

## 2016-12-24 NOTE — ED Provider Notes (Signed)
[facility] DEPT Provider Note   CSN: [id number] Arrival date & time: 12/24/16  1108  By signing my name below, I, Margit Banda, attest that this documentation has been prepared under the direction and in the presence of Providence Lanius, PA-C. Electronically Signed: Margit Banda, ED Scribe. 12/24/16. 2:48 PM.  History   Chief Complaint Chief Complaint  Patient presents with  . Back Pain    HPI Valerie Key is a 56 y.o. female with a PMHx back pain, who presents to the Emergency Department with 3 days of right lateral side pain. Patient states that the pain is constant and describes it as a "sharp, burning sensation." patient noted to have a medial branch block injection on 12/21/16 by [facility]. Se did not call them with current symptoms. Patient states she came to the ED because she was concerned about her lung. She's been taking her regularly prescribed pain medication including Norco and Neurontin without any relief of symptoms. She denies any aggravating factors. Patient is a current smoker and smokes 1.5 packs a day. She has smoked since she was 56 yo old. Denies fevers, weight loss, numbness/weakness of upper and lower extremities, bowel/bladder incontinence, saddle anesthesia, history of back surgery, history of IVDA, rash.   The history is provided by the patient. No language interpreter was used.    Past Medical History:  Diagnosis Date  . Allergy    SEASONAL  . Anxiety   . Arthritis   . Back pain   . Depression   . Gastritis   . Hiatal hernia     Patient Active Problem List   Diagnosis Date Noted  . Dyspepsia 02/12/2012  . Degenerative disc disease 01/02/2012  . Chronic back pain 01/02/2012    Past Surgical History:  Procedure Laterality Date  . ABDOMINAL HYSTERECTOMY     with bladder tact  . BLADDER TACK      OB History    No data available       Home Medications    Prior to Admission medications   Medication Sig  Start Date End Date Taking? Authorizing Provider  albuterol (PROVENTIL HFA;VENTOLIN HFA) 108 (90 Base) MCG/ACT inhaler Inhale 1-2 puffs into the lungs every 6 (six) hours as needed for wheezing or shortness of breath. 08/09/15   [place] Lions, PA-C  ALPRAZolam Duanne Moron) 0.5 MG tablet 1/2-1 tablet by mouth twice a day as needed for anxiety    [provider]  azithromycin (ZITHROMAX) 250 MG tablet Take 1 tablet (250 mg total) by mouth daily. Take first 2 tablets together, then 1 every day until finished. 08/09/15   Flowella Lions, PA-C  cephALEXin (KEFLEX) 500 MG capsule Take 1 capsule (500 mg total) by mouth 4 (four) times daily. Patient not taking: Reported on 04/19/2015 01/18/14   Virgel Manifold, MD  gabapentin (NEURONTIN) 100 MG capsule Take 200 mg by mouth at bedtime.    [provider]  HYDROcodone-acetaminophen (NORCO/VICODIN) 5-325 MG per tablet Take 1 tablet by mouth 2 (two) times daily as needed for moderate pain.    [provider]  ibuprofen (ADVIL,MOTRIN) 200 MG tablet Take 400 mg by mouth every 12 (twelve) hours as needed for moderate pain.    [provider]  loratadine (CLARITIN) 10 MG tablet Take 10 mg by mouth daily.    [provider]  morphine (KADIAN) 30 MG 24 hr capsule Take 30 mg by mouth 2 (two) times daily.    [provider]  pantoprazole (  PROTONIX) 40 MG tablet Take 1 tablet (40 mg total) by mouth daily. Patient not taking: Reported on 04/19/2015 02/12/12 02/11/13  Jerene Bears, MD  predniSONE (DELTASONE) 20 MG tablet Take 3 tablets (60 mg total) by mouth daily. 08/09/15   [place] Lions, PA-C  tiZANidine (ZANAFLEX) 2 MG tablet Take 2 mg by mouth 3 (three) times daily.    [provider]    Family History Family History  Problem Relation Age of Onset  . Colon cancer Paternal Uncle        x 2  . Diabetes Father     Social History Social History  Substance Use Topics  . Smoking status:  Current Every Day Smoker    Packs/day: 1.00    Types: Cigarettes  . Smokeless tobacco: Never Used     Comment: form given 02-12-12  . Alcohol use Yes     Comment: 3 drinks a week     Allergies   Sulfa antibiotics   Review of Systems Review of Systems  Constitutional: Negative for fever.  Respiratory: Positive for cough. Negative for shortness of breath.   Cardiovascular: Negative for chest pain.  Genitourinary: Negative for dysuria and hematuria.  Musculoskeletal: Positive for back pain.  Skin: Negative for rash.  Neurological: Negative for weakness, numbness and headaches.     Physical Exam Updated Vital Signs BP 138/81 (BP Location: Left Arm)   Pulse 79   Temp 98.6 F (37 C) (Oral)   Resp 16   Ht 5\' 4"  (1.626 m)   Wt 63.5 kg (140 lb)   SpO2 96%   BMI 24.03 kg/m   Physical Exam  Constitutional: She appears well-developed and well-nourished.  Sitting comfortably on examination table  HENT:  Head: Normocephalic and atraumatic.  Eyes: Conjunctivae and EOM are normal. Right eye exhibits no discharge. Left eye exhibits no discharge. No scleral icterus.  Neck: Full passive range of motion without pain.  Full flexion/extension and lateral movement of neck fully intact. No bony midline tenderness. No deformities or crepitus.   Cardiovascular: Normal rate and regular rhythm.   Pulses:      Radial pulses are 2+ on the right side, and 2+ on the left side.  Pulmonary/Chest: Effort normal. She has no decreased breath sounds. She has wheezes.  Diffuse expiratory wheezing throughout all lung fields. No evidence of decreased breath sounds. No evidence of respiratory distress. Able to speak in full sentences without difficulty.  Abdominal: There is no CVA tenderness.  No CVA tenderness bilaterally.  Musculoskeletal:       Cervical back: She exhibits no tenderness.       Thoracic back: She exhibits no tenderness.       Lumbar back: She exhibits no tenderness.  Tenderness to  the right lateral chest wall just inferior to the right axilla that extends down to the right lateral side. No midline bony tenderness to C, T or L-spine. No deformities or crepitus noted.  Neurological: She is alert.  Skin: Skin is warm and dry. Capillary refill takes less than 2 seconds. No rash noted.  No evidence of rash  Psychiatric: She has a normal mood and affect. Her speech is normal and behavior is normal.  Nursing note and vitals reviewed.    ED Treatments / Results  DIAGNOSTIC STUDIES: Oxygen Saturation is 97% on RA, normal by my interpretation.   COORDINATION OF CARE: 2:48 PM-Discussed next steps with pt which includes a CXR. Pt verbalized understanding and is agreeable with the  plan.   Labs (all labs ordered are listed, but only abnormal results are displayed) Labs Reviewed - No data to display  EKG  EKG Interpretation None       Radiology Dg Chest 2 View  Result Date: 12/24/2016 CLINICAL DATA:  56 year old female with right-sided chest pain for the past 2 days. No known injury. EXAM: CHEST  2 VIEW COMPARISON:  Prior chest x-ray 08/09/2015 FINDINGS: The lungs are clear and negative for focal airspace consolidation, pulmonary edema or suspicious pulmonary nodule. No pleural effusion or pneumothorax. Cardiac and mediastinal contours are within normal limits. Atherosclerotic calcifications present in the transverse aorta. No acute fracture or lytic or blastic osseous lesions. The visualized upper abdominal bowel gas pattern is unremarkable. IMPRESSION: No active cardiopulmonary disease. Aortic Atherosclerosis (ICD10-170.0) Electronically Signed   By: Jacqulynn Cadet M.D.   On: 12/24/2016 15:43    Procedures Procedures (including critical care time)  Medications Ordered in ED Medications - No data to display   Initial Impression / Assessment and Plan / ED Course  I have reviewed the triage vital signs and the nursing notes.  Pertinent labs & imaging results  that were available during my care of the patient were reviewed by me and considered in my medical decision making (see chart for details).     56 year old female with past history of gastroenteritis the spine who presents with 3 days of right lateral side pain. Patient had a medial nerve branch block performed at her pain management clinic on 12/21/16. Patient is afebrile, non-toxic appearing, sitting comfortably on examination table. Vital signs reviewed and stable. No red flag symptoms. No neuro deficits on exam. History/physical exam are not concerning for shingles, UTI, kidney stone. Given that symptoms are at the lower base of the lungs, we will evaluate chest x-ray for evaluation of any abnormalities.  Chest x-ray reviewed. Needed for any acute abnormality. Discussed results with patient. Instructed her to call her pain management clinic and arrange for follow-up appointment for reevaluation. Instructed her to continue taking her previously prescribed pain medications. Strict return precautions discussed. Patient expresses understanding and agreement to plan.    Final Clinical Impressions(s) / ED Diagnoses   Final diagnoses:  Right-sided chest wall pain  Chronic bilateral back pain, unspecified back location    New Prescriptions Discharge Medication List as of 12/24/2016  3:59 PM     I personally performed the services described in this documentation, which was scribed in my presence. The recorded information has been reviewed and is accurate.     Volanda Napoleon, PA-C 12/26/16 2016    Lajean Saver, MD 12/29/16 [phone]

## 2016-12-24 NOTE — Discharge Instructions (Signed)
[facility] pain [facility] doctor and arrange for an appointment to be seen by them.  Take her previously prescribed pain medications for the pain.  Return the emergency Department for any difficulty walking, no numbness or weakness of the arms or legs, worsening pain, fever, urinary or bowel accidents or any other worsening or concerning splits.

## 2017-05-19 ENCOUNTER — Emergency Department (HOSPITAL_COMMUNITY): Payer: Commercial Managed Care - PPO

## 2017-05-19 ENCOUNTER — Emergency Department (HOSPITAL_COMMUNITY)
Admission: EM | Admit: 2017-05-19 | Discharge: 2017-05-19 | Disposition: A | Discharge status: Home / Self Care | Payer: Commercial Managed Care - PPO | Attending: Emergency Medicine | Admitting: Emergency Medicine

## 2017-05-19 ENCOUNTER — Encounter (HOSPITAL_COMMUNITY): Payer: Self-pay | Admitting: *Deleted

## 2017-05-19 ENCOUNTER — Other Ambulatory Visit: Payer: Self-pay

## 2017-05-19 DIAGNOSIS — J439 Emphysema, unspecified: Secondary | ICD-10-CM | POA: Diagnosis not present

## 2017-05-19 DIAGNOSIS — K760 Fatty (change of) liver, not elsewhere classified: Secondary | ICD-10-CM

## 2017-05-19 DIAGNOSIS — Z79899 Other long term (current) drug therapy: Secondary | ICD-10-CM | POA: Diagnosis not present

## 2017-05-19 DIAGNOSIS — F17210 Nicotine dependence, cigarettes, uncomplicated: Secondary | ICD-10-CM | POA: Insufficient documentation

## 2017-05-19 DIAGNOSIS — I2510 Atherosclerotic heart disease of native coronary artery without angina pectoris: Secondary | ICD-10-CM | POA: Insufficient documentation

## 2017-05-19 DIAGNOSIS — R1011 Right upper quadrant pain: Secondary | ICD-10-CM | POA: Diagnosis present

## 2017-05-19 DIAGNOSIS — I7090 Unspecified atherosclerosis: Secondary | ICD-10-CM

## 2017-05-19 LAB — CBC
HCT: 42.4 % (ref 36.0–46.0)
HEMOGLOBIN: 14.1 g/dL (ref 12.0–15.0)
MCH: 31.3 pg (ref 26.0–34.0)
MCHC: 33.3 g/dL (ref 30.0–36.0)
MCV: 94 fL (ref 78.0–100.0)
Platelets: 264 10*3/uL (ref 150–400)
RBC: 4.51 MIL/uL (ref 3.87–5.11)
RDW: 14 % (ref 11.5–15.5)
WBC: 12.8 10*3/uL — ABNORMAL HIGH (ref 4.0–10.5)

## 2017-05-19 LAB — COMPREHENSIVE METABOLIC PANEL
ALK PHOS: 73 U/L (ref 38–126)
ALT: 14 U/L (ref 14–54)
ANION GAP: 9 (ref 5–15)
AST: 18 U/L (ref 15–41)
Albumin: 3.8 g/dL (ref 3.5–5.0)
BILIRUBIN TOTAL: 0.2 mg/dL — AB (ref 0.3–1.2)
BUN: 11 mg/dL (ref 6–20)
CO2: 25 mmol/L (ref 22–32)
CREATININE: 0.76 mg/dL (ref 0.44–1.00)
Calcium: 9 mg/dL (ref 8.9–10.3)
Chloride: 105 mmol/L (ref 101–111)
GFR calc Af Amer: >60 mL/min (ref >60)
GFR calc non Af Amer: >60 mL/min (ref >60)
Glucose, Bld: 102 mg/dL — ABNORMAL HIGH (ref 65–99)
Potassium: 3.7 mmol/L (ref 3.5–5.1)
Sodium: 139 mmol/L (ref 135–145)
TOTAL PROTEIN: 6.7 g/dL (ref 6.5–8.1)

## 2017-05-19 LAB — I-STAT TROPONIN, ED: TROPONIN I, POC: 0 ng/mL (ref 0.00–0.08)

## 2017-05-19 LAB — URINALYSIS, ROUTINE W REFLEX MICROSCOPIC
BILIRUBIN URINE: NEGATIVE
Glucose, UA: NEGATIVE mg/dL
HGB URINE DIPSTICK: NEGATIVE
Ketones, ur: 5 mg/dL — AB
Leukocytes, UA: NEGATIVE
NITRITE: NEGATIVE
Protein, ur: NEGATIVE mg/dL
SPECIFIC GRAVITY, URINE: 1.024 (ref 1.005–1.030)
pH: 5 (ref 5.0–8.0)

## 2017-05-19 LAB — LIPASE, BLOOD: Lipase: 39 U/L (ref 11–51)

## 2017-05-19 LAB — I-STAT BETA HCG BLOOD, ED (MC, WL, AP ONLY): HCG, QUANTITATIVE: 12.8 m[IU]/mL — AB (ref <5)

## 2017-05-19 MED ORDER — IOPAMIDOL (ISOVUE-370) INJECTION 76%
INTRAVENOUS | Status: AC
  Administered 2017-05-19: 100 mL
  Filled 2017-05-19: qty 100

## 2017-05-19 MED ORDER — SODIUM CHLORIDE 0.9 % IV BOLUS (SEPSIS)
500.0000 mL | Freq: Once | INTRAVENOUS | Status: AC
  Administered 2017-05-19: 500 mL via INTRAVENOUS

## 2017-05-19 MED ORDER — MORPHINE SULFATE (PF) 4 MG/ML IV SOLN
4.0000 mg | Freq: Once | INTRAVENOUS | Status: AC
  Administered 2017-05-19: 4 mg via INTRAVENOUS
  Filled 2017-05-19: qty 1

## 2017-05-19 NOTE — ED Provider Notes (Signed)
[facility]PART[facility] Provider Note   CSN: [id number] Arrival date & time: 05/19/17  1414     History   Chief Complaint Chief Complaint  Patient presents with  . Abdominal Pain    HPI Valerie Key Valerie Key is a 56 y.o. female.  HPI 56 year old female with right upper quadrant pain intermittently for the past 2-3 weeks.  Pain worsens with p.o. intake.  She has not had associated nausea, vomiting, cough, or dyspnea.  She describes the pain as crampy.  She has not had similar symptoms in the past.  She denies any hematuria, frequency, or pain with urination.  She has not had any fever or cough.  She is a smoker.  She denies any history of PE or DVT. Past Medical History:  Diagnosis Date  . Allergy    SEASONAL  . Anxiety   . Arthritis   . Back pain   . Depression   . Gastritis   . Hiatal hernia     Patient Active Problem List   Diagnosis Date Noted  . Dyspepsia 02/12/2012  . Degenerative disc disease 01/02/2012  . Chronic back pain 01/02/2012    Past Surgical History:  Procedure Laterality Date  . ABDOMINAL HYSTERECTOMY     with bladder tact  . BLADDER TACK      OB History    No data available       Home Medications    Prior to Admission medications   Medication Sig Start Date End Date Taking? Authorizing Provider  albuterol (PROVENTIL HFA;VENTOLIN HFA) 108 (90 Base) MCG/ACT inhaler Inhale 1-2 puffs into the lungs every 6 (six) hours as needed for wheezing or shortness of breath. 08/09/15   [place] Lions, PA-C  ALPRAZolam Duanne Moron) 0.5 MG tablet 1/2-1 tablet by mouth twice a day as needed for anxiety    [provider]  azithromycin (ZITHROMAX) 250 MG tablet Take 1 tablet (250 mg total) by mouth daily. Take first 2 tablets together, then 1 every day until finished. 08/09/15   [place] Lions, PA-C  cephALEXin (KEFLEX) 500 MG capsule Take 1 capsule (500 mg total) by mouth 4 (four) times daily. Patient not  taking: Reported on 04/19/2015 01/18/14   Virgel Manifold, MD  gabapentin (NEURONTIN) 100 MG capsule Take 200 mg by mouth at bedtime.    [provider]  HYDROcodone-acetaminophen (NORCO/VICODIN) 5-325 MG per tablet Take 1 tablet by mouth 2 (two) times daily as needed for moderate pain.    [provider]  ibuprofen (ADVIL,MOTRIN) 200 MG tablet Take 400 mg by mouth every 12 (twelve) hours as needed for moderate pain.    [provider]  loratadine (CLARITIN) 10 MG tablet Take 10 mg by mouth daily.    [provider]  morphine (KADIAN) 30 MG 24 hr capsule Take 30 mg by mouth 2 (two) times daily.    [provider]  pantoprazole (PROTONIX) 40 MG tablet Take 1 tablet (40 mg total) by mouth daily. Patient not taking: Reported on 04/19/2015 02/12/12 02/11/13  Jerene Bears, MD  predniSONE (DELTASONE) 20 MG tablet Take 3 tablets (60 mg total) by mouth daily. 08/09/15   [place] Lions, PA-C  tiZANidine (ZANAFLEX) 2 MG tablet Take 2 mg by mouth 3 (three) times daily.    [provider]    Family History Family History  Problem Relation Age of Onset  . Diabetes Father   . Colon cancer Paternal Uncle  x 2    Social History Social History   Tobacco Use  . Smoking status: Current Every Day Smoker    Packs/day: 1.00    Types: Cigarettes  . Smokeless tobacco: Never Used  . Tobacco comment: form given 02-12-12  Substance Use Topics  . Alcohol use: Yes    Comment: 3 drinks a week  . Drug use: No     Allergies   Sulfa antibiotics   Review of Systems Review of Systems  All other systems reviewed and are negative.    Physical Exam Updated Vital Signs BP (!) 129/94 (BP Location: Right Arm)   Pulse (!) 104   Temp 99 F (37.2 C) (Oral)   Resp 16   SpO2 97%   Physical Exam  Constitutional: She appears well-developed and well-nourished.  HENT:  Head: Normocephalic and atraumatic.  Mouth/Throat: Oropharynx is clear and  moist.  Eyes: EOM are normal. Pupils are equal, round, and reactive to light.  Cardiovascular: Normal rate and regular rhythm.  Pulmonary/Chest: Effort normal.  Abdominal: Soft. Normal appearance and bowel sounds are normal. There is no hepatosplenomegaly. There is tenderness in the right upper quadrant.    Nursing note and vitals reviewed.    ED Treatments / Results  Labs (all labs ordered are listed, but only abnormal results are displayed) Labs Reviewed  CBC - Abnormal; Notable for the following components:      Result Value   WBC 12.8 (*)    All other components within normal limits  URINALYSIS, ROUTINE W REFLEX MICROSCOPIC - Abnormal; Notable for the following components:   APPearance HAZY (*)    Ketones, ur 5 (*)    All other components within normal limits  LIPASE, BLOOD  COMPREHENSIVE METABOLIC PANEL    EKG  EKG Interpretation  Date/Time:  Sunday May 19 2017 18:48:44 EST Ventricular Rate:  80 PR Interval:  146 QRS Duration: 82 QT Interval:  410 QTC Calculation: 472 R Axis:   78 Text Interpretation:  Normal sinus rhythm No significant change since last tracing Confirmed by Pattricia Boss [phone]) on 05/19/2017 7:05:44 PM       Radiology Dg Chest 2 View  Result Date: 05/19/2017 CLINICAL DATA:  Right side chest pain EXAM: CHEST  2 VIEW COMPARISON:  12/24/2016 FINDINGS: There is hyperinflation of the lungs compatible with COPD. Heart and mediastinal contours are within normal limits. No focal opacities or effusions. No acute bony abnormality. IMPRESSION: Hyperinflation/ COPD.  No active cardiopulmonary disease. Electronically Signed   By: Rolm Baptise M.D.   On: 05/19/2017 15:37   US Abdomen Complete  Result Date: 05/19/2017 CLINICAL DATA:  Right upper quadrant pain for 2 weeks. EXAM: ABDOMEN ULTRASOUND COMPLETE COMPARISON:  CT 10/08/2014 FINDINGS: Gallbladder: No gallstones or wall thickening visualized. No sonographic Murphy sign noted by sonographer.  Common bile duct: Diameter: 6.3 mm Liver: No focal lesion identified. Within normal limits in parenchymal echogenicity. Portal vein is patent on color Doppler imaging with normal direction of blood flow towards the liver. IVC: No abnormality visualized. Pancreas: Visualized portion unremarkable. Spleen: Size and appearance within normal limits. Right Kidney: Length: 10.4 cm. Echogenicity within normal limits. No mass or hydronephrosis visualized. Left Kidney: Length: 10.4 cm. Echogenicity within normal limits. No mass or hydronephrosis visualized. Abdominal aorta: No aneurysm visualized.  Aortic atherosclerosis. Other findings: None. IMPRESSION: 1. No acute findings. 2.  Aortic Atherosclerosis (ICD10-I70.0). Electronically Signed   By: Kerby Moors M.D.   On: 05/19/2017 18:00   Ct Angio Chest/abd/pel For  Dissection W And/or W/wo  Result Date: 05/19/2017 CLINICAL DATA:  Right upper quadrant pain radiating to right shoulder. EXAM: CT ANGIOGRAPHY CHEST, ABDOMEN AND PELVIS TECHNIQUE: Multidetector CT imaging through the chest, abdomen and pelvis was performed using the standard protocol during bolus administration of intravenous contrast. Multiplanar reconstructed images and MIPs were obtained and reviewed to evaluate the vascular anatomy. CONTRAST:  139mL ISOVUE-370 IOPAMIDOL (ISOVUE-370) INJECTION 76% COMPARISON:  CT abdomen and pelvis 10/18/2014 FINDINGS: CTA CHEST FINDINGS Cardiovascular: Aorta is normal caliber. No dissection. Heart is normal size. No filling defects in the pulmonary artery is to suggest pulmonary emboli. Mediastinum/Nodes: No mediastinal, hilar, or axillary adenopathy. Trachea and esophagus are unremarkable. Thyroid unremarkable. Lungs/Pleura: Moderate emphysema. Lungs are clear. No focal airspace opacities or suspicious nodules. No effusions. Musculoskeletal: No acute bony abnormality. Review of the MIP images confirms the above findings. CTA ABDOMEN AND PELVIS FINDINGS VASCULAR Aorta:  Aortic irregularity and atherosclerosis. No aneurysm or dissection. Celiac: Widely patent SMA: Widely patent Renals: Single bilaterally, widely patent IMA: Widely patent Inflow: Widely patent Veins: None of  sec grossly unremarkable. Review of the MIP images confirms the above findings. NON-VASCULAR Hepatobiliary: Diffuse low-density throughout the liver suggesting fatty infiltration. There is subtle nodularity along the liver contour suggesting early cirrhosis. No focal hepatic abnormality. Gallbladder unremarkable. Pancreas: No focal abnormality or ductal dilatation. Spleen: No focal abnormality.  Normal size. Adrenals/Urinary Tract: No adrenal abnormality. No focal renal abnormality. No stones or hydronephrosis. Urinary bladder is unremarkable. Stomach/Bowel: Sigmoid diverticulosis.  No active diverticulitis. Lymphatic: No adenopathy. Reproductive: Prior hysterectomy.  No adnexal masses. Other: No free fluid or free air. Musculoskeletal: No acute bony abnormality. Review of the MIP images confirms the above findings. IMPRESSION: Atherosclerotic irregularity and calcifications throughout the aorta. No aneurysm or dissection. No evidence of pulmonary embolus. Emphysema. Fatty infiltration of the liver. Suggestion of subtle nodularity along the surface suggests early cirrhosis. Recommend clinical correlation. Electronically Signed   By: Rolm Baptise M.D.   On: 05/19/2017 20:25    Procedures Procedures (including critical care time)  Medications Ordered in ED Medications - No data to display   Initial Impression / Assessment and Plan / ED Course  I have reviewed the triage vital signs and the nursing notes.  Pertinent labs & imaging results that were available during my care of the patient were reviewed by me and considered in my medical decision making (see chart for details).     5:32 PM Patient continues to have ruq ttp and pain but [place] not resulted.    56 y.o. Female with ruq pain- labs, [place], and  ct without abnormality to explain pain.  Incidental findings of emphysematous lung changes, fatty liver infiltrate, asd of aorta.  Patient advised re need for f/u and risk factor modifications.  We discussed need for f/u and return precautions and voices understanding. Elevated bhcg- will need recheck.  Final Clinical Impressions(s) / ED Diagnoses   Final diagnoses:  Right upper quadrant abdominal pain  Atherosclerotic vascular disease  Fatty infiltration of liver  Emphysematous bleb of lung [facility])    ED Discharge Orders    None       Pattricia Boss, MD 05/19/17 2038

## 2017-05-19 NOTE — ED Triage Notes (Signed)
Pt reports having RUQ pain x 2 weeks. Reports pain radiates into her shoulder blade. Reports recent diarrhea but denies n/v.

## 2017-05-19 NOTE — Discharge Instructions (Signed)
Stop smoking Recheck pregnancy test this week- follow up with your gynecologist. Your liver has fatty infiltrate- please avoid alcohol and recheck with your doctor.

## 2017-05-23 ENCOUNTER — Other Ambulatory Visit (HOSPITAL_COMMUNITY): Payer: Self-pay | Admitting: Family Medicine

## 2017-05-23 ENCOUNTER — Encounter: Payer: Self-pay | Admitting: Internal Medicine

## 2017-05-23 DIAGNOSIS — R1011 Right upper quadrant pain: Secondary | ICD-10-CM

## 2017-06-03 ENCOUNTER — Encounter (HOSPITAL_COMMUNITY)
Admission: RE | Admit: 2017-06-03 | Discharge: 2017-06-03 | Disposition: A | Discharge status: Home / Self Care | Payer: Commercial Managed Care - PPO | Source: Ambulatory Visit | Attending: Family Medicine | Admitting: Family Medicine

## 2017-06-03 DIAGNOSIS — R1011 Right upper quadrant pain: Secondary | ICD-10-CM

## 2017-06-03 MED ORDER — TECHNETIUM TC 99M MEBROFENIN IV KIT
5.0000 | PACK | Freq: Once | INTRAVENOUS | Status: AC | PRN
  Administered 2017-06-03: 5 via INTRAVENOUS

## 2017-06-14 ENCOUNTER — Other Ambulatory Visit (INDEPENDENT_AMBULATORY_CARE_PROVIDER_SITE_OTHER): Payer: Commercial Managed Care - PPO

## 2017-06-14 ENCOUNTER — Ambulatory Visit: Payer: Commercial Managed Care - PPO | Admitting: Physician Assistant

## 2017-06-14 ENCOUNTER — Encounter: Payer: Self-pay | Admitting: Physician Assistant

## 2017-06-14 VITALS — BP 132/78 | HR 80 | Ht 64.0 in | Wt 134.4 lb

## 2017-06-14 DIAGNOSIS — Z1211 Encounter for screening for malignant neoplasm of colon: Secondary | ICD-10-CM

## 2017-06-14 DIAGNOSIS — K760 Fatty (change of) liver, not elsewhere classified: Secondary | ICD-10-CM

## 2017-06-14 DIAGNOSIS — K7460 Unspecified cirrhosis of liver: Secondary | ICD-10-CM

## 2017-06-14 DIAGNOSIS — R1011 Right upper quadrant pain: Secondary | ICD-10-CM

## 2017-06-14 LAB — PROTIME-INR
INR: 1 ratio (ref 0.8–1.0)
Prothrombin Time: 10.7 s (ref 9.6–13.1)

## 2017-06-14 LAB — FERRITIN: Ferritin: 144.4 ng/mL (ref 10.0–291.0)

## 2017-06-14 MED ORDER — PANTOPRAZOLE SODIUM 40 MG PO TBEC: DELAYED_RELEASE_TABLET | ORAL | 3 refills | Status: DC

## 2017-06-14 MED ORDER — NA SULFATE-K SULFATE-MG SULF 17.5-3.13-1.6 GM/177ML PO SOLN: 1.0000 | Freq: Once | ORAL | 0 refills | Status: AC

## 2017-06-14 NOTE — Progress Notes (Addendum)
Subjective:    Patient ID: Valerie Key, female    DOB: 20-Apr-1961, 57 y.o.   MRN: [id number]  HPI Valerie Key is a 57 year old white female, known to Dr. Hilarie Fredrickson from endoscopy done in 2013 who comes back today after recent ER visit. She was [facility] by the [facility]. Patient has history of degenerative disc disease and COPD. She had EGD in 2013 with finding of a nonobstructive Schatzki's ring and mild gastritis. Stomach was biopsied and negative for H. pylori. He has not had prior colonoscopy. Patient says her current symptoms started in early December 2018 with right upper abdominal pain that radiates around into her shoulder blade on the right. He describes it as being a constant aching stabbing burning type pain that varies in intensity. She thought initially symptoms were worse with fatty foods and had decreased her fat intake. She says she was also having some diarrhea had onset of symptoms which has since resolved. She's been on Zantac long-term for reflux type symptoms and says that is not helping her current pain at all. She does not recall any injury fall etc. She has not had any nausea or vomiting, no fever or chills, appetite has been okay weight has been stable. She says her bowel movements have been normal no melena or hematochezia. She is on chronic narcotics for degenerative disc disease and is followed by the pain clinic. She denies any regular aspirin or NSAID use. Upper abdominal ultrasound December 2018 showed no gallstones common bile duct of 1.3 cm, he had aortic atherosclerotic changes CT in June of the chest abdomen and pelvis were done during that ER visit as well showing atherosclerotic irregularity and calcifications throughout her aorta no aneurysm or dissection, no PE. She has COPD, fatty infiltration of the liver suggesting of subtle nodularity suggesting early cirrhosis. Also has sigmoid diverticulosis CCK HIDA scan was done on 06/03/2017 was normal. Labs  from 05/19/2017 WBC of 12.8, hemoglobin 14.1 on the troponin within normal limits, liver function studies normal and lipase within normal limits.   Review of Systems Pertinent positive and negative review of systems were noted in the above HPI section.  All other review of systems was otherwise negative.  Outpatient Encounter Medications as of 06/14/2017  Medication Sig  . ALPRAZolam (XANAX) 0.5 MG tablet 1/2-1 tablet by mouth twice a day as needed for anxiety  . citalopram (CELEXA) 20 MG tablet Take 20 mg by mouth at bedtime.   Marland Kitchen HYDROcodone-acetaminophen (NORCO) 10-325 MG tablet Take 1 tablet by mouth every 4 (four) hours as needed for moderate pain.   Marland Kitchen loratadine (CLARITIN) 10 MG tablet Take 10 mg by mouth daily.  . ranitidine (ZANTAC) 150 MG capsule Take 150 mg by mouth daily.  Marland Kitchen tiZANidine (ZANAFLEX) 2 MG tablet Take 2 mg by mouth every 8 (eight) hours as needed for muscle spasms.   . Na Sulfate-K Sulfate-Mg Sulf 17.5-3.13-1.6 GM/177ML SOLN Take 1 kit by mouth once for 1 dose.  . pantoprazole (PROTONIX) 40 MG tablet Take 1 tab with breakfast every morning.  . [DISCONTINUED] albuterol (PROVENTIL HFA;VENTOLIN HFA) 108 (90 Base) MCG/ACT inhaler Inhale 1-2 puffs into the lungs every 6 (six) hours as needed for wheezing or shortness of breath.   No facility-administered encounter medications on file as of 06/14/2017.    Allergies  Allergen Reactions  . Sulfa Antibiotics Rash and Hives   Patient Active Problem List   Diagnosis Date Noted  . Dyspepsia 02/12/2012  . Degenerative disc disease 01/02/2012  .  Chronic back pain 01/02/2012   Social History   Socioeconomic History  . Marital status: Legally Separated    Spouse name: Not on file  . Number of children: 2  . Years of education: Not on file  . Highest education level: Not on file  Social Needs  . Financial resource strain: Not on file  . Food insecurity - worry: Not on file  . Food insecurity - inability: Not on file  .  Transportation needs - medical: Not on file  . Transportation needs - non-medical: Not on file  Occupational History  . Occupation: Armed forces operational officer: [employer]  Tobacco Use  . Smoking status: Current Every Day Smoker    Packs/day: 1.00    Types: Cigarettes  . Smokeless tobacco: Never Used  Substance and Sexual Activity  . Alcohol use: Yes    Comment: 1-2 daily  . Drug use: No  . Sexual activity: Yes    Birth control/protection: Surgical  Other Topics Concern  . Not on file  Social History Narrative  . Not on file    Valerie Key's family history includes Colon cancer in her paternal uncle; Diabetes in her father; Heart disease in her maternal grandfather and paternal grandfather.      Objective:    Vitals:   06/14/17 1435  BP: 132/78  Pulse: 80    Physical Exam  well-developed white female in no acute distress, blood pressure 132/78 pulse 80, height 5 foot 4, weight 134, BMI 23.0. HEENT; nontraumatic normocephalic EOMI PERRLA sclera anicteric, Cardiovascular ;regular rate and rhythm with S1-S2 no murmur or gallop, Pulmonary ;clear bilaterally, Abdomen; soft, she's tenderness high up in the right upper quadrant, there is no palpable mass or hepatosplenomegaly bowel sounds are present there is no guarding, Rectal ;exam not done, extremities; no clubbing cyanosis or edema skin warm and dry, Neuropsych ;mood and affect appropriate       Assessment & Plan:   #24 57 year old white female with 1 month history of right upper quadrant pain radiating into the right scapula. She has had fairly extensive workup with upper abdominal ultrasound CCK HIDA scan and CT angiogram of the chest abdomen and pelvis all negative for findings to explain her pain. We will rule out peptic ulcer disease duodenitis. It is possible that her pain is actually radicular. #2 colon cancer surveillance-patient has not had prior colonoscopy #3 aortic atherosclerosis #4 sigmoid diverticulosis #5  COPD-current smoker-no oxygen use #6 chronic pain syndrome with degenerative disc disease on chronic narcotics #7 fatty liver with probable early cirrhosis by CT-etiology not clear  Plan; continue Zantac 150 mg but take at bedtime and add Protonix 40 mg by mouth every morning before meals breakfast Patient will be scheduled for upper endoscopy and colonoscopy with Dr. Hilarie Fredrickson. Procedure was discussed in detail with the patient including indications risks and benefits and she is agreeable to proceed.  Check INR, hepatitis serologies , autoimmune markers, ferritin.  Bernerd Terhune S Martin Belling PA-C 06/14/2017   Cc: No ref. provider found  Addendum: Reviewed and agree with initial management. Pyrtle, Lajuan Lines, MD

## 2017-06-14 NOTE — Patient Instructions (Addendum)
Please go to the basement level to have your labs drawn.  We have sent the following medications to your pharmacy for you to pick up at your convenience: [place] and [employer].  1. Pantoprazole sodium 40 mg 2. Suprep for the colonoscopy  Continue Zantac 150 mg, 1 tablet by mouth at bedtime.  You have been scheduled for a colonoscopy and endoscopy. Please follow written instructions given to you at your visit today.  Please pick up your prep supplies at the pharmacy within the next 1-3 days. If you use inhalers (even only as needed), please bring them with you on the day of your procedure. Your physician has requested that you go to www.startemmi.com and enter the access code given to you at your visit today. This web site gives a general overview about your procedure. However, you should still follow specific instructions given to you by our office regarding your preparation for the procedure.  If you are age 55 or younger, your body mass index should be between 19-25. Your Body mass index is 23.07 kg/m. If this is out of the aformentioned range listed, please consider follow up with your Primary Care Provider.

## 2017-06-18 LAB — HEPATITIS A ANTIBODY, TOTAL: HEPATITIS A AB,TOTAL: NONREACTIVE

## 2017-06-18 LAB — MITOCHONDRIAL ANTIBODIES

## 2017-06-18 LAB — HEPATITIS C ANTIBODY
HEP C AB: NONREACTIVE
SIGNAL TO CUT-OFF: 0.02 (ref <1.00)

## 2017-06-18 LAB — ANTI-SMOOTH MUSCLE ANTIBODY, IGG: Actin (Smooth Muscle) Antibody (IGG): <20 U (ref <20)

## 2017-06-18 LAB — HEPATITIS B SURFACE ANTIGEN: Hepatitis B Surface Ag: NONREACTIVE

## 2017-06-18 LAB — HEPATITIS B SURFACE ANTIBODY,QUALITATIVE: Hep B S Ab: NONREACTIVE

## 2017-06-18 LAB — ANA: Anti Nuclear Antibody(ANA): NEGATIVE

## 2017-06-18 LAB — ALPHA-1-ANTITRYPSIN: A-1 Antitrypsin, Ser: 156 mg/dL (ref 83–199)

## 2017-06-18 LAB — CERULOPLASMIN: Ceruloplasmin: 37 mg/dL (ref 18–53)

## 2017-06-28 ENCOUNTER — Encounter: Payer: Self-pay | Admitting: Internal Medicine

## 2017-07-04 ENCOUNTER — Encounter: Payer: Self-pay | Admitting: Internal Medicine

## 2017-07-04 ENCOUNTER — Ambulatory Visit (AMBULATORY_SURGERY_CENTER): Payer: Commercial Managed Care - PPO | Admitting: Internal Medicine

## 2017-07-04 ENCOUNTER — Other Ambulatory Visit: Payer: Self-pay

## 2017-07-04 VITALS — BP 152/92 | HR 84 | Temp 97.1°F | Resp 13 | Ht 64.0 in | Wt 134.0 lb

## 2017-07-04 DIAGNOSIS — Z1211 Encounter for screening for malignant neoplasm of colon: Secondary | ICD-10-CM | POA: Diagnosis not present

## 2017-07-04 DIAGNOSIS — R1011 Right upper quadrant pain: Secondary | ICD-10-CM | POA: Diagnosis not present

## 2017-07-04 DIAGNOSIS — Z1212 Encounter for screening for malignant neoplasm of rectum: Secondary | ICD-10-CM | POA: Diagnosis not present

## 2017-07-04 DIAGNOSIS — D129 Benign neoplasm of anus and anal canal: Secondary | ICD-10-CM

## 2017-07-04 DIAGNOSIS — K621 Rectal polyp: Secondary | ICD-10-CM

## 2017-07-04 DIAGNOSIS — D128 Benign neoplasm of rectum: Secondary | ICD-10-CM

## 2017-07-04 MED ORDER — SODIUM CHLORIDE 0.9 % IV SOLN: 500.0000 mL | Freq: Once | INTRAVENOUS | Status: DC

## 2017-07-04 NOTE — Progress Notes (Signed)
Report to PACU, RN, vss, BBS= Clear.  

## 2017-07-04 NOTE — Patient Instructions (Signed)
Impression/recommendations:  Endoscopy: Hiatal hernia  Colonoscopy: Polyp (handout given) Diverticulosis (handout given)  YOU HAD AN ENDOSCOPIC PROCEDURE TODAY AT [facility]:   Refer to the procedure report that was given to you for any specific questions about what was found during the examination.  If the procedure report does not answer your questions, please call your gastroenterologist to clarify.  If you requested that your care partner not be given the details of your procedure findings, then the procedure report has been included in a sealed envelope for you to review at your convenience later.  YOU SHOULD EXPECT: Some feelings of bloating in the abdomen. Passage of more gas than usual.  Walking can help get rid of the air that was put into your GI tract during the procedure and reduce the bloating. If you had a lower endoscopy (such as a colonoscopy or flexible sigmoidoscopy) you may notice spotting of blood in your stool or on the toilet paper. If you underwent a bowel prep for your procedure, you may not have a normal bowel movement for a few days.  Please Note:  You might notice some irritation and congestion in your nose or some drainage.  This is from the oxygen used during your procedure.  There is no need for concern and it should clear up in a day or so.  SYMPTOMS TO REPORT IMMEDIATELY:   Following lower endoscopy (colonoscopy or flexible sigmoidoscopy):  Excessive amounts of blood in the stool  Significant tenderness or worsening of abdominal pains  Swelling of the abdomen that is new, acute  Fever of 100F or higher   Following upper endoscopy (EGD)  Vomiting of blood or coffee ground material  New chest pain or pain under the shoulder blades  Painful or persistently difficult swallowing  New shortness of breath  Fever of 100F or higher  Black, tarry-looking stools  For urgent or emergent issues, a gastroenterologist can be reached at any hour by  calling [phone].   DIET:  We do recommend a small meal at first, but then you may proceed to your regular diet.  Drink plenty of fluids but you should avoid alcoholic beverages for 24 hours.  ACTIVITY:  You should plan to take it easy for the rest of today and you should NOT DRIVE or use heavy machinery until tomorrow (because of the sedation medicines used during the test).    FOLLOW UP: Our staff will call the number listed on your records the next business day following your procedure to check on you and address any questions or concerns that you may have regarding the information given to you following your procedure. If we do not reach you, we will leave a message.  However, if you are feeling well and you are not experiencing any problems, there is no need to return our call.  We will assume that you have returned to your regular daily activities without incident.  If any biopsies were taken you will be contacted by phone or by letter within the next 1-3 weeks.  Please call us at [phone] if you have not heard about the biopsies in 3 weeks.    SIGNATURES/CONFIDENTIALITY: You and/or your care partner have signed paperwork which will be entered into your electronic medical record.  These signatures attest to the fact that that the information above on your After Visit Summary has been reviewed and is understood.  Full responsibility of the confidentiality of this discharge information lies with you and/or  your care-partner. 

## 2017-07-04 NOTE — Op Note (Signed)
[facility] Patient Name: Valerie Key Procedure Date: 07/04/2017 2:34 PM MRN: [id number] Endoscopist: Jerene Bears , MD Age: 57 Referring MD:  Date of Birth: 1960/11/01 Gender: Female Account #: [id number] Procedure:                Upper GI endoscopy Indications:              Abdominal pain in the right upper quadrant Medicines:                Monitored Anesthesia Care Procedure:                Pre-Anesthesia Assessment:                           - Prior to the procedure, a History and Physical                            was performed, and patient medications and                            allergies were reviewed. The patient's tolerance of                            previous anesthesia was also reviewed. The risks                            and benefits of the procedure and the sedation                            options and risks were discussed with the patient.                            All questions were answered, and informed consent                            was obtained. Prior Anticoagulants: The patient has                            taken no previous anticoagulant or antiplatelet                            agents. ASA Grade Assessment: II - A patient with                            mild systemic disease. After reviewing the risks                            and benefits, the patient was deemed in                            satisfactory condition to undergo the procedure.                           After obtaining informed consent, the endoscope was  passed under direct vision. Throughout the                            procedure, the patient's blood pressure, pulse, and                            oxygen saturations were monitored continuously. The                            Model GIF-HQ190 [id number]) scope was introduced                            through the mouth, and advanced to the second part                            of duodenum.  The upper GI endoscopy was                            accomplished without difficulty. The patient                            tolerated the procedure well. Scope In: Scope Out: Findings:                 The examined esophagus was normal.                           A 1 cm hiatal hernia was present.                           The entire examined stomach was normal. Biopsies                            were taken with a cold forceps for histology and                            Helicobacter pylori testing.                           The examined duodenum was normal. Complications:            No immediate complications. Estimated Blood Loss:     Estimated blood loss was minimal. Impression:               - Normal esophagus.                           - 1 cm hiatal hernia.                           - Normal stomach. Biopsied.                           - Normal examined duodenum. Recommendation:           - Patient has a contact number available for  emergencies. The signs and symptoms of potential                            delayed complications were discussed with the                            patient. Return to normal activities tomorrow.                            Written discharge instructions were provided to the                            patient.                           - Resume previous diet.                           - Continue present medications.                           - Await pathology results. Jerene Bears, MD 07/04/2017 3:01:14 PM This report has been signed electronically.

## 2017-07-04 NOTE — Op Note (Signed)
[facility] Patient Name: Valerie Key Procedure Date: 07/04/2017 2:34 PM MRN: [id number] Endoscopist: Jerene Bears , MD Age: 57 Referring MD:  Date of Birth: [birth year] Gender: Female Account #: [id number] Procedure:                Colonoscopy Indications:              Screening for colorectal malignant neoplasm, This                            is the patient's first colonoscopy Medicines:                Monitored Anesthesia Care Procedure:                Pre-Anesthesia Assessment:                           - Prior to the procedure, a History and Physical                            was performed, and patient medications and                            allergies were reviewed. The patient's tolerance of                            previous anesthesia was also reviewed. The risks                            and benefits of the procedure and the sedation                            options and risks were discussed with the patient.                            All questions were answered, and informed consent                            was obtained. Prior Anticoagulants: The patient has                            taken no previous anticoagulant or antiplatelet                            agents. ASA Grade Assessment: II - A patient with                            mild systemic disease. After reviewing the risks                            and benefits, the patient was deemed in                            satisfactory condition to undergo the procedure.                           -  Prior to the procedure, a History and Physical                            was performed, and patient medications and                            allergies were reviewed. The patient's tolerance of                            previous anesthesia was also reviewed. The risks                            and benefits of the procedure and the sedation                            options and risks were discussed with  the patient.                            All questions were answered, and informed consent                            was obtained. Prior Anticoagulants: The patient has                            taken no previous anticoagulant or antiplatelet                            agents. ASA Grade Assessment: II - A patient with                            mild systemic disease. After reviewing the risks                            and benefits, the patient was deemed in                            satisfactory condition to undergo the procedure.                           After obtaining informed consent, the colonoscope                            was passed under direct vision. Throughout the                            procedure, the patient's blood pressure, pulse, and                            oxygen saturations were monitored continuously. The                            Colonoscope was introduced through the anus and  advanced to the the cecum, identified by                            appendiceal orifice and ileocecal valve. The                            colonoscopy was performed without difficulty. The                            patient tolerated the procedure well. The quality                            of the bowel preparation was good. The ileocecal                            valve, appendiceal orifice, and rectum were                            photographed. Scope In: 2:45:40 PM Scope Out: 2:57:09 PM Scope Withdrawal Time: 0 hours 8 minutes 1 second  Total Procedure Duration: 0 hours 11 minutes 29 seconds  Findings:                 The digital rectal exam was normal.                           A 5 mm polyp was found in the rectum. The polyp was                            sessile. The polyp was removed with a cold snare.                            Resection and retrieval were complete.                           Multiple small and large-mouthed diverticula were                             found in the sigmoid colon.                           The exam was otherwise without abnormality on                            direct and retroflexion views. Complications:            No immediate complications. Estimated Blood Loss:     Estimated blood loss was minimal. Impression:               - One 5 mm polyp in the rectum, removed with a cold                            snare. Resected and retrieved.                           -  Moderate diverticulosis in the sigmoid colon.                           - The examination was otherwise normal on direct                            and retroflexion views. Recommendation:           - Patient has a contact number available for                            emergencies. The signs and symptoms of potential                            delayed complications were discussed with the                            patient. Return to normal activities tomorrow.                            Written discharge instructions were provided to the                            patient.                           - Resume previous diet.                           - Continue present medications.                           - Await pathology results.                           - Repeat colonoscopy is recommended for                            surveillance. The colonoscopy date will be                            determined after pathology results from today's                            exam become available for review. Jerene Bears, MD 07/04/2017 3:04:14 PM This report has been signed electronically.

## 2017-07-04 NOTE — Progress Notes (Signed)
Called to room to assist during endoscopic procedure.  Patient ID and intended procedure confirmed with present staff. Received instructions for my participation in the procedure from the performing physician.  

## 2017-07-05 ENCOUNTER — Telehealth: Payer: Self-pay

## 2017-07-05 NOTE — Telephone Encounter (Signed)
  Follow up Call-  Call back number 07/04/2017  Post procedure Call Back phone  # [phone]  Permission to leave phone message No  comments NO VOICEMAIL  Some recent data might be hidden     Patient questions:  Do you have a fever, pain , or abdominal swelling? No. Pain Score  0 *  Have you tolerated food without any problems? Yes.    Have you been able to return to your normal activities? Yes.    Do you have any questions about your discharge instructions: Diet   No. Medications  No. Follow up visit  No.  Do you have questions or concerns about your Care? No.  Actions: * If pain score is 4 or above: No action needed, pain <4.

## 2017-07-09 ENCOUNTER — Emergency Department (HOSPITAL_COMMUNITY)
Admission: EM | Admit: 2017-07-09 | Discharge: 2017-07-09 | Disposition: A | Discharge status: Home / Self Care | Payer: Commercial Managed Care - PPO | Attending: Emergency Medicine | Admitting: Emergency Medicine

## 2017-07-09 ENCOUNTER — Other Ambulatory Visit: Payer: Self-pay

## 2017-07-09 ENCOUNTER — Encounter (HOSPITAL_COMMUNITY): Payer: Self-pay | Admitting: *Deleted

## 2017-07-09 ENCOUNTER — Emergency Department (HOSPITAL_COMMUNITY): Payer: Commercial Managed Care - PPO

## 2017-07-09 DIAGNOSIS — E876 Hypokalemia: Secondary | ICD-10-CM | POA: Diagnosis not present

## 2017-07-09 DIAGNOSIS — R05 Cough: Secondary | ICD-10-CM

## 2017-07-09 DIAGNOSIS — F17210 Nicotine dependence, cigarettes, uncomplicated: Secondary | ICD-10-CM | POA: Diagnosis not present

## 2017-07-09 DIAGNOSIS — R0789 Other chest pain: Secondary | ICD-10-CM

## 2017-07-09 DIAGNOSIS — R079 Chest pain, unspecified: Secondary | ICD-10-CM | POA: Diagnosis present

## 2017-07-09 DIAGNOSIS — Z79899 Other long term (current) drug therapy: Secondary | ICD-10-CM | POA: Insufficient documentation

## 2017-07-09 DIAGNOSIS — J449 Chronic obstructive pulmonary disease, unspecified: Secondary | ICD-10-CM | POA: Insufficient documentation

## 2017-07-09 DIAGNOSIS — J45909 Unspecified asthma, uncomplicated: Secondary | ICD-10-CM | POA: Diagnosis not present

## 2017-07-09 DIAGNOSIS — R072 Precordial pain: Secondary | ICD-10-CM | POA: Insufficient documentation

## 2017-07-09 DIAGNOSIS — R058 Other specified cough: Secondary | ICD-10-CM

## 2017-07-09 LAB — CBC
HCT: 42.5 % (ref 36.0–46.0)
HEMOGLOBIN: 13.9 g/dL (ref 12.0–15.0)
MCH: 31 pg (ref 26.0–34.0)
MCHC: 32.7 g/dL (ref 30.0–36.0)
MCV: 94.9 fL (ref 78.0–100.0)
Platelets: 242 10*3/uL (ref 150–400)
RBC: 4.48 MIL/uL (ref 3.87–5.11)
RDW: 13.2 % (ref 11.5–15.5)
WBC: 11.7 10*3/uL — ABNORMAL HIGH (ref 4.0–10.5)

## 2017-07-09 LAB — BASIC METABOLIC PANEL
ANION GAP: 12 (ref 5–15)
BUN: 10 mg/dL (ref 6–20)
CALCIUM: 9 mg/dL (ref 8.9–10.3)
CHLORIDE: 101 mmol/L (ref 101–111)
CO2: 27 mmol/L (ref 22–32)
Creatinine, Ser: 0.74 mg/dL (ref 0.44–1.00)
GFR calc non Af Amer: >60 mL/min (ref >60)
GLUCOSE: 82 mg/dL (ref 65–99)
POTASSIUM: 3.1 mmol/L — AB (ref 3.5–5.1)
Sodium: 140 mmol/L (ref 135–145)

## 2017-07-09 LAB — I-STAT BETA HCG BLOOD, ED (MC, WL, AP ONLY): I-stat hCG, quantitative: 16.6 m[IU]/mL — ABNORMAL HIGH (ref <5)

## 2017-07-09 LAB — I-STAT TROPONIN, ED: TROPONIN I, POC: 0 ng/mL (ref 0.00–0.08)

## 2017-07-09 MED ORDER — POTASSIUM CHLORIDE CRYS ER 20 MEQ PO TBCR
40.0000 meq | EXTENDED_RELEASE_TABLET | Freq: Once | ORAL | Status: AC
  Administered 2017-07-09: 40 meq via ORAL
  Filled 2017-07-09: qty 2

## 2017-07-09 MED ORDER — POTASSIUM CHLORIDE CRYS ER 20 MEQ PO TBCR: 20.0000 meq | EXTENDED_RELEASE_TABLET | Freq: Every day | ORAL | 0 refills | Status: DC

## 2017-07-09 MED ORDER — TRAMADOL HCL 50 MG PO TABS
50.0000 mg | ORAL_TABLET | Freq: Once | ORAL | Status: AC
  Administered 2017-07-09: 50 mg via ORAL
  Filled 2017-07-09: qty 1

## 2017-07-09 NOTE — Discharge Instructions (Signed)
It was our pleasure to provide your ER care today - we hope that you feel better.  For pain, take acetaminophen and/or ibuprofen as need.  May try cough medication such as mucinex as need. Avoid smoking.   From today's lab tests, your potassium level is mildly low  (3.1) - eat plenty of fruits and vegetables, take potassium supplement as prescribed, and follow up with primary care doctor in the coming week week.  Follow up with your doctor in the coming week for recheck.  Return to ER if worse, new symptoms, fevers, increased trouble breathing, persistent or recurrent chest pain, other concern.  You were given pain medication in the ER - no driving for the next 4 hours.

## 2017-07-09 NOTE — ED Triage Notes (Signed)
States Thurs had endotoscopy and colonoscopy , fric/o left side sharp pain with radiation to left arm. C/o sharp pain feels like a knife stabbing with cough no change c/o left arm still hurting.

## 2017-07-09 NOTE — ED Provider Notes (Signed)
[facility] EMERGENCY DEPARTMENT Provider Note   CSN: [id number] Arrival date & time: 07/09/17  3295     History   Chief Complaint Chief Complaint  Patient presents with  . Chest Pain    HPI Valerie Key Dajsha Massaro is a 57 y.o. female.  Patient c/o sharp lateral left chest/arm/shoulder area pain with coughing for the past few days. Cough is episodic, not productive. With cough, gets a sharp localized pain. No exertional chest pain or discomfort. No associated nausea/vomiting or diaphoresis. No fever or chills. Denies cough or uri c/o. No unusual doe or fatigue - feels breathing at baseline. No personal or family hx cad. Denies leg pain or swelling. No hx dvt or pe. Symptoms persistent for the past few days.    The history is provided by the patient.  Chest Pain   Associated symptoms include cough. Pertinent negatives include no abdominal pain, no fever, no headaches, no shortness of breath and no vomiting.    Past Medical History:  Diagnosis Date  . Allergy    SEASONAL  . Anxiety   . Arthritis   . Asthma   . Back pain   . COPD (chronic obstructive pulmonary disease) ([place])   . Depression   . Gastritis   . GERD (gastroesophageal reflux disease)   . Hiatal hernia   . Hyperlipidemia     Patient Active Problem List   Diagnosis Date Noted  . Dyspepsia 02/12/2012  . Degenerative disc disease 01/02/2012  . Chronic back pain 01/02/2012    Past Surgical History:  Procedure Laterality Date  . ABDOMINAL HYSTERECTOMY     with bladder tact  . BLADDER TACK      OB History    No data available       Home Medications    Prior to Admission medications   Medication Sig Start Date End Date Taking? Authorizing Provider  ALPRAZolam (XANAX) 0.5 MG tablet 1/2-1 tablet by mouth twice a day as needed for anxiety   Yes [provider]  citalopram (CELEXA) 20 MG tablet Take 20 mg by mouth at bedtime.  05/16/17  Yes [provider]    HYDROcodone-acetaminophen (NORCO) 10-325 MG tablet Take 1 tablet by mouth every 4 (four) hours as needed for moderate pain.    Yes [provider]  loratadine (CLARITIN) 10 MG tablet Take 10 mg by mouth daily.   Yes [provider]  pantoprazole (PROTONIX) 40 MG tablet Take 1 tab with breakfast every morning. 06/14/17  Yes Esterwood, Amy S, PA-C  ranitidine (ZANTAC) 150 MG capsule Take 150 mg by mouth daily as needed for heartburn.    Yes [provider]  tiZANidine (ZANAFLEX) 2 MG tablet Take 2 mg by mouth every 8 (eight) hours as needed for muscle spasms.    Yes [provider]  Cetirizine HCl (ZYRTEC PO) Take by mouth.    [provider]    Family History Family History  Problem Relation Age of Onset  . Diabetes Father   . Colon cancer Paternal Uncle        x 2  . Heart disease Maternal Grandfather   . Heart disease Paternal Grandfather     Social History Social History   Tobacco Use  . Smoking status: Current Every Day Smoker    Packs/day: 1.00    Types: Cigarettes  . Smokeless tobacco: Never Used  Substance Use Topics  . Alcohol use: Yes    Comment: 1-2 daily  . Drug  use: No     Allergies   Sulfa antibiotics   Review of Systems Review of Systems  Constitutional: Negative for fever.  HENT: Negative for sore throat.   Eyes: Negative for redness.  Respiratory: Positive for cough. Negative for shortness of breath.   Cardiovascular: Positive for chest pain. Negative for leg swelling.  Gastrointestinal: Negative for abdominal pain and vomiting.  Genitourinary: Negative for flank pain.  Musculoskeletal: Negative for neck pain.  Skin: Negative for rash.  Neurological: Negative for headaches.  Hematological: Does not bruise/bleed easily.  Psychiatric/Behavioral: Negative for confusion.     Physical Exam Updated Vital Signs BP (!) 144/78 (BP Location: Right Arm)   Pulse 91   Temp 98.1 F (36.7 C) (Oral)   Resp 18    Ht 1.626 m (5\' 4" )   Wt 60.8 kg (134 lb)   SpO2 98%   BMI 23.00 kg/m   Physical Exam  Constitutional: She appears well-developed and well-nourished. No distress.  HENT:  Mouth/Throat: Oropharynx is clear and moist.  Eyes: Conjunctivae are normal. No scleral icterus.  Neck: Neck supple. No tracheal deviation present.  Cardiovascular: Normal rate, regular rhythm, normal heart sounds and intact distal pulses. Exam reveals no gallop and no friction rub.  No murmur heard. Pulmonary/Chest: Effort normal and breath sounds normal. No respiratory distress. She exhibits tenderness.  Abdominal: Soft. Normal appearance and bowel sounds are normal. She exhibits no distension. There is no tenderness.  Musculoskeletal: She exhibits no edema or tenderness.  Neurological: She is alert.  Skin: Skin is warm and dry. No rash noted. She is not diaphoretic.  Psychiatric: She has a normal mood and affect.  Nursing note and vitals reviewed.    ED Treatments / Results  Labs (all labs ordered are listed, but only abnormal results are displayed) Results for orders placed or performed during the hospital encounter of 93/79/02  Basic metabolic panel  Result Value Ref Range   Sodium 140 135 - 145 mmol/L   Potassium 3.1 (L) 3.5 - 5.1 mmol/L   Chloride 101 101 - 111 mmol/L   CO2 27 22 - 32 mmol/L   Glucose, Bld 82 65 - 99 mg/dL   BUN 10 6 - 20 mg/dL   Creatinine, Ser 0.74 0.44 - 1.00 mg/dL   Calcium 9.0 8.9 - 10.3 mg/dL   GFR calc non Af Amer >60 >60 mL/min   GFR calc Af Amer >60 >60 mL/min   Anion gap 12 5 - 15  CBC  Result Value Ref Range   WBC 11.7 (H) 4.0 - 10.5 K/uL   RBC 4.48 3.87 - 5.11 MIL/uL   Hemoglobin 13.9 12.0 - 15.0 g/dL   HCT 42.5 36.0 - 46.0 %   MCV 94.9 78.0 - 100.0 fL   MCH 31.0 26.0 - 34.0 pg   MCHC 32.7 30.0 - 36.0 g/dL   RDW 13.2 11.5 - 15.5 %   Platelets 242 150 - 400 K/uL  I-stat troponin, ED  Result Value Ref Range   Troponin i, poc 0.00 0.00 - 0.08 ng/mL   Comment 3           I-Stat beta hCG blood, ED  Result Value Ref Range   I-stat hCG, quantitative 16.6 (H) <5 mIU/mL   Comment 3           Dg Chest 2 View  Result Date: 07/09/2017 CLINICAL DATA:  Left-sided chest pain radiating to the left arm for the past 5 days. Persistent cough, smoker. History of  COPD. EXAM: CHEST  2 VIEW COMPARISON:  Chest x-ray May 19, 2017 and chest CT scan of the same day. FINDINGS: The lungs remain hyperinflated with hemidiaphragm flattening. There is no focal infiltrate. There is no pleural effusion the heart and pulmonary vascularity are normal. There calcification in the wall of the aortic arch. The trachea is midline. The bony thorax exhibits no acute abnormality. IMPRESSION: COPD. No pneumonia, CHF, nor other acute cardiopulmonary abnormality. Thoracic aortic atherosclerosis. Electronically Signed   By: David  Martinique M.D.   On: 07/09/2017 07:23    EKG  EKG Interpretation  Date/Time:  Tuesday July 09 2017 06:32:48 EST Ventricular Rate:  95 PR Interval:  142 QRS Duration: 80 QT Interval:  350 QTC Calculation: 439 R Axis:   83 Text Interpretation:  Normal sinus rhythm Normal ECG No significant change since last tracing Confirmed by Lajean Saver [phone]) on 07/09/2017 10:36:05 AM       Radiology Dg Chest 2 View  Result Date: 07/09/2017 CLINICAL DATA:  Left-sided chest pain radiating to the left arm for the past 5 days. Persistent cough, smoker. History of COPD. EXAM: CHEST  2 VIEW COMPARISON:  Chest x-ray May 19, 2017 and chest CT scan of the same day. FINDINGS: The lungs remain hyperinflated with hemidiaphragm flattening. There is no focal infiltrate. There is no pleural effusion the heart and pulmonary vascularity are normal. There calcification in the wall of the aortic arch. The trachea is midline. The bony thorax exhibits no acute abnormality. IMPRESSION: COPD. No pneumonia, CHF, nor other acute cardiopulmonary abnormality. Thoracic aortic atherosclerosis.  Electronically Signed   By: David  Martinique M.D.   On: 07/09/2017 07:23    Procedures Procedures (including critical care time)  Medications Ordered in ED Medications - No data to display   Initial Impression / Assessment and Plan / ED Course  I have reviewed the triage vital signs and the nursing notes.  Pertinent labs & imaging results that were available during my care of the patient were reviewed by me and considered in my medical decision making (see chart for details).  Monitor. Ecg.   After symptoms for past few days, trop negative.   cxr neg acute.   Ultram for pain.  ED workup unremarkable. +chest wall tenderness, and description of sharp pain w cough, also appears most c/w musculoskeletal type of pain.  Patient currently appears stable for d/c.   k low, kcl po.   Final Clinical Impressions(s) / ED Diagnoses   Final diagnoses:  None    ED Discharge Orders    None       Lajean Saver, MD 07/09/17 1116

## 2017-07-10 ENCOUNTER — Encounter: Payer: Self-pay | Admitting: Internal Medicine

## 2017-07-12 ENCOUNTER — Telehealth: Payer: Self-pay | Admitting: Internal Medicine

## 2017-07-12 NOTE — Telephone Encounter (Signed)
Path letters reviewed with pt. Pt states that she is still having RUQ pain and that Dr. Hilarie Fredrickson had said that if she was negative for H pylori he was going to have her see surgery for possible gallbladder surgery. Please advise.

## 2017-07-12 NOTE — Telephone Encounter (Signed)
Surgical referral for consideration of cholecystectomy [facility]

## 2017-07-12 NOTE — Telephone Encounter (Signed)
Received same message that voicemail not set up yet.

## 2017-07-12 NOTE — Telephone Encounter (Signed)
Attempted to call pt back but received message that voicemail box has not been set up yet.

## 2017-07-15 NOTE — Telephone Encounter (Signed)
Referral faxed to [facility] for possible gallbladder surgery. Pt knows to expect a phone call from [facility] regarding appt.

## 2017-07-18 ENCOUNTER — Ambulatory Visit: Payer: Commercial Managed Care - PPO | Admitting: Internal Medicine

## 2017-07-18 NOTE — Telephone Encounter (Signed)
Rec'd notification from [facility] that pt has appt with Dr. [name] on 07-25-17 at 10:30am.  Pt has been notified.

## 2017-07-25 ENCOUNTER — Other Ambulatory Visit: Payer: Self-pay

## 2017-07-25 ENCOUNTER — Ambulatory Visit: Payer: Self-pay | Admitting: Surgery

## 2017-07-25 ENCOUNTER — Encounter (HOSPITAL_COMMUNITY): Payer: Self-pay | Admitting: *Deleted

## 2017-07-25 NOTE — H&P (View-Only) (Signed)
Valerie Key Documented: 07/25/2017 10:19 AM Location: [facility] Patient #: [id number] DOB: [birth year] Single / Language: Valerie Key / Race: White Female  History of Present Illness (Valerie Key A. Valerie Heller MD; 07/25/2017 10:37 AM) Patient words: This is a very nice 57 year old woman who is referred for possible cholecystectomy. She has been having right subcostal pain which radiates to her back. This began in mid December. She has not had issues with nausea or vomiting. She does report diarrhea. The pain is exacerbated by eating but it is always there. She states that she was nothing by mouth for 2 days leading up to her recent endoscopic workup and that the pain went away when she wasn't eating. She denies fevers or jaundice. She has had a previous hysterectomy through Pfannenstiel incision. Her workup has included chest x-ray in the ER which showed hyperinflation/COPD but no acute disease, ultrasound that same day which showed no gallstones or gallbladder abnormality, common bile duct was 6.3 mm, liver without focal lesion/ normal limits in parenchymal echogenicity, patent portal vein with normal direction of flow, no abnormalities in the visualized pancreas, spleen, IVC or either kidney. CT angiography of the chest and pelvis that same day confirmed emphysema, aortic atherosclerosis, diffuse low-density throughout the liver suggesting fatty infiltration, subtle nodularity suggesting early cirrhosis but no focal abnormality and nothing with the gallbladder. Otherwise no abnormality. She has had a HIDA scan which showed a gallbladder EF of 69%. I worked that day included a basic metabolic panel which was normal, CBC was normal, platelets 242. She had an upper endoscopy on January 31 which showed a 1 cm hiatal hernia but no abnormalities otherwise. Colonoscopy done today showed a 5 mm rectal polyp and diverticulosis but otherwise normal exam. Biopsies were negative for malignancy or H.  pylori. Medical history notable for reflux, depression, COPD, arthritis. She reports no history of heart disease denies angina, states that she can easily walk up 2 or 3 flights of stairs. She is a current daily smoker but is trying to quit. The Tube Alcohol Beverages Daily. 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   Past Surgical History Valerie Key, Valerie Key; 07/25/2017 10:19 AM) Colon Polyp Removal - Colonoscopy Hysterectomy (not due to cancer) - Partial  Diagnostic Studies History Valerie Key, Key; 07/25/2017 10:19 AM) Colonoscopy within last year Mammogram >3 years ago Pap Smear >5 years ago  Allergies Valerie Key, Key; 07/25/2017 10:21 AM) Sulfa Antibiotics Hives.  Medication History Valerie Key, Key; 07/25/2017 10:23 AM) Xanax (0.25MG  Tablet, Oral) Active. Hydrocodone-Acetaminophen (10-325MG  Tablet, Oral) Active. Citalopram Hydrobromide (20MG  Tablet, Oral) Active. Loratadine (10MG  Tablet, Oral) Active. TiZANidine HCl (2MG  Tablet, Oral) Active. Rosuvastatin Calcium (20MG  Tablet, Oral) Active. Pantoprazole Sodium (40MG  Tablet DR, Oral) Active. ProAir HFA (108 (90 Base)MCG/ACT Aerosol Soln, Inhalation) Active. RaNITidine HCl (150MG  Capsule, Oral) Active. Medications Reconciled  Social History Valerie Key, Key; 07/25/2017 10:19 AM) Alcohol use Occasional alcohol use. Caffeine use Carbonated beverages, Coffee. No drug use Tobacco use Current every day smoker.  Family History Valerie Key, Valerie Key; 07/25/2017 10:19 AM) Alcohol Abuse Mother. Cerebrovascular Accident Mother. Colon Cancer Family Members In General. Colon Polyps Father. Depression Brother, Mother, Sister. Diabetes Mellitus Father. Heart Disease Family Members In General, Father. Heart disease in female family member before age 31 Hypertension Brother, Father, Mother, Sister.  Pregnancy / Birth History Valerie Key, Valerie Key; 07/25/2017 10:19 AM) Age at  menarche 68 years. Age of menopause 77-50 Contraceptive History Oral contraceptives. Gravida 2 Irregular periods  Maternal age 80-25 Para 2  Other Problems Valerie Key, [place]; 07/25/2017 10:19 AM) Anxiety Disorder Arthritis Back Pain Chronic Obstructive Lung Disease Depression Gastroesophageal Reflux Disease Hypercholesterolemia Inguinal Hernia     Review of Systems (Valerie Key; 07/25/2017 10:19 AM) General Not Present- Appetite Loss, Chills, Fatigue, Fever, Night Sweats, Weight Gain and Weight Loss. Skin Not Present- Change in Wart/Mole, Dryness, Hives, Jaundice, New Lesions, Non-Healing Wounds, Rash and Ulcer. HEENT Present- Wears glasses/contact lenses. Not Present- Earache, Hearing Loss, Hoarseness, Nose Bleed, Oral Ulcers, Ringing in the Ears, Seasonal Allergies, Sinus Pain, Sore Throat, Visual Disturbances and Yellow Eyes. Respiratory Present- Wheezing. Not Present- Bloody sputum, Chronic Cough, Difficulty Breathing and Snoring. Breast Not Present- Breast Mass, Breast Pain, Nipple Discharge and Skin Changes. Cardiovascular Not Present- Chest Pain, Difficulty Breathing Lying Down, Leg Cramps, Palpitations, Rapid Heart Rate, Shortness of Breath and Swelling of Extremities. Gastrointestinal Present- Abdominal Pain. Not Present- Bloating, Bloody Stool, Change in Bowel Habits, Chronic diarrhea, Constipation, Difficulty Swallowing, Excessive gas, Gets full quickly at meals, Hemorrhoids, Indigestion, Nausea, Rectal Pain and Vomiting. Female Genitourinary Not Present- Frequency, Nocturia, Painful Urination, Pelvic Pain and Urgency. Musculoskeletal Present- Back Pain. Not Present- Joint Pain, Joint Stiffness, Muscle Pain, Muscle Weakness and Swelling of Extremities. Neurological Not Present- Decreased Memory, Fainting, Headaches, Numbness, Seizures, Tingling, Tremor, Trouble walking and Weakness. Psychiatric Present- Depression. Not Present- Anxiety, Bipolar, Change in  Sleep Pattern, Fearful and Frequent crying. Endocrine Not Present- Cold Intolerance, Excessive Hunger, Hair Changes, Heat Intolerance, Hot flashes and New Diabetes. Hematology Not Present- Blood Thinners, Easy Bruising, Excessive bleeding, Gland problems, HIV and Persistent Infections.  Vitals (Valerie Key; 07/25/2017 10:20 AM) 07/25/2017 10:20 AM Weight: 137.38 lb Height: 64in Body Surface Area: 1.67 m Body Mass Index: 23.58 kg/m  Temp.: 98.40F  Pulse: 101 (Regular)  P.OX: 96% (Room air) BP: 140/88 (Sitting, Left Arm, Standard)      Physical Exam (Vonette Grosso A. Valerie Heller MD; 07/25/2017 10:38 AM)  General Note: Alert, cooperative, appears older than stated age  Integumentary Note: warm and dry  Head and Neck Note: no mass or thyromegaly  Eye Note: No scleral icterus. Extra ocular motions intact.  ENMT Note: Moist mucous membranes, dentition intact  Chest and Lung Exam Note: Unlabored respirations, clear bilaterally  Cardiovascular Note: Regular rate and rhythm, no pedal edema  Abdomen Note: Soft, nondistended. No palpable mass or organomegaly. She is tender along the right subcostal margin.  Neurologic Note: Grossly intact, normal gait  Neuropsychiatric Note: Normal mood and affect. Appropriate insight.  Musculoskeletal Note: Strength symmetrical throughout, no deformity    Assessment & Plan (Rajan Burgard A. Jakelin Taussig MD; 07/25/2017 83:66 AM)  BILIARY COLIC (Q94.76) Story: Her symptoms are consistent with biliary etiology despite her negative workup. I recommended laparoscopic cholecystectomy we discussed the procedure in detail. Discussed risks of bleeding, infection, pain, scarring, intra-abdominal injury specifically to the common bile duct and sequelae. Discussed the typical recovery time. Discussed the risk that this will not resolve her symptoms given her negative workup to date. Questions were welcomed and answered. We will get her scheduled in  the coming weeks.

## 2017-07-25 NOTE — H&P (Signed)
Valerie Key Documented: 07/25/2017 10:19 AM Location: [facility] Patient #: [id number] DOB: 04/13/1961 Single / Language: Valerie Key / Race: White Female  History of Present Illness (Valerie Key A. [name] MD; 07/25/2017 10:37 AM) Patient words: This is a very nice 57 year old woman who is referred for possible cholecystectomy. She has been having right subcostal pain which radiates to her back. This began in mid December. She has not had issues with nausea or vomiting. She does report diarrhea. The pain is exacerbated by eating but it is always there. She states that she was nothing by mouth for 2 days leading up to her recent endoscopic workup and that the pain went away when she wasn't eating. She denies fevers or jaundice. She has had a previous hysterectomy through Pfannenstiel incision. Her workup has included chest x-ray in the ER which showed hyperinflation/COPD but no acute disease, ultrasound that same day which showed no gallstones or gallbladder abnormality, common bile duct was 6.3 mm, liver without focal lesion/ normal limits in parenchymal echogenicity, patent portal vein with normal direction of flow, no abnormalities in the visualized pancreas, spleen, IVC or either kidney. CT angiography of the chest and pelvis that same day confirmed emphysema, aortic atherosclerosis, diffuse low-density throughout the liver suggesting fatty infiltration, subtle nodularity suggesting early cirrhosis but no focal abnormality and nothing with the gallbladder. Otherwise no abnormality. She has had a HIDA scan which showed a gallbladder EF of 69%. I worked that day included a basic metabolic panel which was normal, CBC was normal, platelets 242. She had an upper endoscopy on January 31 which showed a 1 cm hiatal hernia but no abnormalities otherwise. Colonoscopy done today showed a 5 mm rectal polyp and diverticulosis but otherwise normal exam. Biopsies were negative for malignancy or H.  pylori. Medical history notable for reflux, depression, COPD, arthritis. She reports no history of heart disease denies angina, states that she can easily walk up 2 or 3 flights of stairs. She is a current daily smoker but is trying to quit. The Tube Alcohol Beverages Daily. 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   Past Surgical History Valerie Key, [name]; 07/25/2017 10:19 AM) Colon Polyp Removal - Colonoscopy Hysterectomy (not due to cancer) - Partial  Diagnostic Studies History Valerie Key, [name]; 07/25/2017 10:19 AM) Colonoscopy within last year Mammogram >3 years ago Pap Smear >5 years ago  Allergies Valerie Key, [name]; 07/25/2017 10:21 AM) Sulfa Antibiotics Hives.  Medication History Valerie Key, [name]; 07/25/2017 10:23 AM) Xanax (0.25MG  Tablet, Oral) Active. Hydrocodone-Acetaminophen (10-325MG  Tablet, Oral) Active. Citalopram Hydrobromide (20MG  Tablet, Oral) Active. Loratadine (10MG  Tablet, Oral) Active. TiZANidine HCl (2MG  Tablet, Oral) Active. Rosuvastatin Calcium (20MG  Tablet, Oral) Active. Pantoprazole Sodium (40MG  Tablet DR, Oral) Active. ProAir HFA (108 (90 Base)MCG/ACT Aerosol Soln, Inhalation) Active. RaNITidine HCl (150MG  Capsule, Oral) Active. Medications Reconciled  Social History Valerie Key, [name]; 07/25/2017 10:19 AM) Alcohol use Occasional alcohol use. Caffeine use Carbonated beverages, Coffee. No drug use Tobacco use Current every day smoker.  Family History Valerie Key, Valerie Key; 07/25/2017 10:19 AM) Alcohol Abuse Mother. Cerebrovascular Accident Mother. Colon Cancer Family Members In General. Colon Polyps Father. Depression Brother, Mother, Sister. Diabetes Mellitus Father. Heart Disease Family Members In General, Father. Heart disease in female family member before age 45 Hypertension Brother, Father, Mother, Sister.  Pregnancy / Birth History Valerie Key, [name]; 07/25/2017 10:19 AM) Age at  menarche 30 years. Age of menopause 32-50 Contraceptive History Oral contraceptives. Gravida 2 Irregular periods  Maternal age 44-25 Para 2  Other Problems Valerie Key, [name]; 07/25/2017 10:19 AM) Anxiety Disorder Arthritis Back Pain Chronic Obstructive Lung Disease Depression Gastroesophageal Reflux Disease Hypercholesterolemia Inguinal Hernia     Review of Systems (Valerie Key [name]; 07/25/2017 10:19 AM) General Not Present- Appetite Loss, Chills, Fatigue, Fever, Night Sweats, Weight Gain and Weight Loss. Skin Not Present- Change in Wart/Mole, Dryness, Hives, Jaundice, New Lesions, Non-Healing Wounds, Rash and Ulcer. HEENT Present- Wears glasses/contact lenses. Not Present- Earache, Hearing Loss, Hoarseness, Nose Bleed, Oral Ulcers, Ringing in the Ears, Seasonal Allergies, Sinus Pain, Sore Throat, Visual Disturbances and Yellow Eyes. Respiratory Present- Wheezing. Not Present- Bloody sputum, Chronic Cough, Difficulty Breathing and Snoring. Breast Not Present- Breast Mass, Breast Pain, Nipple Discharge and Skin Changes. Cardiovascular Not Present- Chest Pain, Difficulty Breathing Lying Down, Leg Cramps, Palpitations, Rapid Heart Rate, Shortness of Breath and Swelling of Extremities. Gastrointestinal Present- Abdominal Pain. Not Present- Bloating, Bloody Stool, Change in Bowel Habits, Chronic diarrhea, Constipation, Difficulty Swallowing, Excessive gas, Gets full quickly at meals, Hemorrhoids, Indigestion, Nausea, Rectal Pain and Vomiting. Female Genitourinary Not Present- Frequency, Nocturia, Painful Urination, Pelvic Pain and Urgency. Musculoskeletal Present- Back Pain. Not Present- Joint Pain, Joint Stiffness, Muscle Pain, Muscle Weakness and Swelling of Extremities. Neurological Not Present- Decreased Memory, Fainting, Headaches, Numbness, Seizures, Tingling, Tremor, Trouble walking and Weakness. Psychiatric Present- Depression. Not Present- Anxiety, Bipolar, Change in  Sleep Pattern, Fearful and Frequent crying. Endocrine Not Present- Cold Intolerance, Excessive Hunger, Hair Changes, Heat Intolerance, Hot flashes and New Diabetes. Hematology Not Present- Blood Thinners, Easy Bruising, Excessive bleeding, Gland problems, HIV and Persistent Infections.  Vitals (Valerie Key [name]; 07/25/2017 10:20 AM) 07/25/2017 10:20 AM Weight: 137.38 lb Height: 64in Body Surface Area: 1.67 m Body Mass Index: 23.58 kg/m  Temp.: 98.3F  Pulse: 101 (Regular)  P.OX: 96% (Room air) BP: 140/88 (Sitting, Left Arm, Standard)      Physical Exam (Trenten Watchman A. [name] MD; 07/25/2017 10:38 AM)  General Note: Alert, cooperative, appears older than stated age  Integumentary Note: warm and dry  Head and Neck Note: no mass or thyromegaly  Eye Note: No scleral icterus. Extra ocular motions intact.  ENMT Note: Moist mucous membranes, dentition intact  Chest and Lung Exam Note: Unlabored respirations, clear bilaterally  Cardiovascular Note: Regular rate and rhythm, no pedal edema  Abdomen Note: Soft, nondistended. No palpable mass or organomegaly. She is tender along the right subcostal margin.  Neurologic Note: Grossly intact, normal gait  Neuropsychiatric Note: Normal mood and affect. Appropriate insight.  Musculoskeletal Note: Strength symmetrical throughout, no deformity    Assessment & Plan (Carollyn Etcheverry A. [place] Duquette MD; 07/25/2017 50:38 AM)  BILIARY COLIC (U82.80) Story: Her symptoms are consistent with biliary etiology despite her negative workup. I recommended laparoscopic cholecystectomy we discussed the procedure in detail. Discussed risks of bleeding, infection, pain, scarring, intra-abdominal injury specifically to the common bile duct and sequelae. Discussed the typical recovery time. Discussed the risk that this will not resolve her symptoms given her negative workup to date. Questions were welcomed and answered. We will get her scheduled in  the coming weeks.

## 2017-07-25 NOTE — Anesthesia Preprocedure Evaluation (Addendum)
Anesthesia Evaluation  Patient identified by MRN, date of birth, ID band Patient awake    Reviewed: Allergy & Precautions, H&P , NPO status , Patient's Chart, lab work & pertinent test results  Airway Mallampati: II  TM Distance: >3 FB Neck ROM: Full    Dental no notable dental hx. (+) Teeth Intact, Dental Advisory Given   Pulmonary neg pulmonary ROS, asthma , COPD, Current Smoker,    Pulmonary exam normal breath sounds clear to auscultation       Cardiovascular Exercise Tolerance: Good negative cardio ROS   Rhythm:Regular Rate:Normal     Neuro/Psych Anxiety Depression negative neurological ROS  negative psych ROS   GI/Hepatic negative GI ROS, Neg liver ROS, GERD  Medicated and Controlled,  Endo/Other  negative endocrine ROS  Renal/GU negative Renal ROS  negative genitourinary   Musculoskeletal  (+) Arthritis , Osteoarthritis,    Abdominal   Peds  Hematology negative hematology ROS (+)   Anesthesia Other Findings   Reproductive/Obstetrics negative OB ROS                            Anesthesia Physical Anesthesia Plan  ASA: II  Anesthesia Plan: General   Post-op Pain Management:    Induction: Intravenous  PONV Risk Score and Plan: 3 and Ondansetron, Dexamethasone and Midazolam  Airway Management Planned:   Additional Equipment:   Intra-op Plan:   Post-operative Plan: Extubation in OR  Informed Consent: I have reviewed the patients History and Physical, chart, labs and discussed the procedure including the risks, benefits and alternatives for the proposed anesthesia with the patient or authorized representative who has indicated his/her understanding and acceptance.   Dental advisory given  Plan Discussed with: CRNA  Anesthesia Plan Comments:         Anesthesia Quick Evaluation

## 2017-07-25 NOTE — Progress Notes (Signed)
Patient called and told to arrive at 0530am for surgery on 07/26/2017 with surgery time of 0715am.  Patient voiced understanding.

## 2017-07-26 ENCOUNTER — Encounter (HOSPITAL_COMMUNITY): Payer: Self-pay | Admitting: Certified Registered"

## 2017-07-26 ENCOUNTER — Ambulatory Visit (HOSPITAL_COMMUNITY)
Admission: RE | Admit: 2017-07-26 | Discharge: 2017-07-26 | Disposition: A | Discharge status: Home / Self Care | Payer: Commercial Managed Care - PPO | Source: Ambulatory Visit | Attending: Surgery | Admitting: Surgery

## 2017-07-26 ENCOUNTER — Ambulatory Visit (HOSPITAL_COMMUNITY): Payer: Commercial Managed Care - PPO | Admitting: Anesthesiology

## 2017-07-26 ENCOUNTER — Encounter (HOSPITAL_COMMUNITY)
Admission: RE | Disposition: A | Discharge status: Home / Self Care | Payer: Self-pay | Source: Ambulatory Visit | Attending: Surgery

## 2017-07-26 DIAGNOSIS — M1990 Unspecified osteoarthritis, unspecified site: Secondary | ICD-10-CM | POA: Diagnosis not present

## 2017-07-26 DIAGNOSIS — K811 Chronic cholecystitis: Secondary | ICD-10-CM | POA: Diagnosis present

## 2017-07-26 DIAGNOSIS — Z79899 Other long term (current) drug therapy: Secondary | ICD-10-CM | POA: Diagnosis not present

## 2017-07-26 DIAGNOSIS — Z833 Family history of diabetes mellitus: Secondary | ICD-10-CM | POA: Insufficient documentation

## 2017-07-26 DIAGNOSIS — F329 Major depressive disorder, single episode, unspecified: Secondary | ICD-10-CM | POA: Insufficient documentation

## 2017-07-26 DIAGNOSIS — Z823 Family history of stroke: Secondary | ICD-10-CM | POA: Diagnosis not present

## 2017-07-26 DIAGNOSIS — Z800 Family history of malignant neoplasm of digestive organs: Secondary | ICD-10-CM | POA: Insufficient documentation

## 2017-07-26 DIAGNOSIS — F17200 Nicotine dependence, unspecified, uncomplicated: Secondary | ICD-10-CM | POA: Diagnosis not present

## 2017-07-26 DIAGNOSIS — M549 Dorsalgia, unspecified: Secondary | ICD-10-CM | POA: Insufficient documentation

## 2017-07-26 DIAGNOSIS — Z8371 Family history of colonic polyps: Secondary | ICD-10-CM | POA: Insufficient documentation

## 2017-07-26 DIAGNOSIS — Z811 Family history of alcohol abuse and dependence: Secondary | ICD-10-CM | POA: Diagnosis not present

## 2017-07-26 DIAGNOSIS — Z8249 Family history of ischemic heart disease and other diseases of the circulatory system: Secondary | ICD-10-CM | POA: Insufficient documentation

## 2017-07-26 DIAGNOSIS — Z882 Allergy status to sulfonamides status: Secondary | ICD-10-CM | POA: Diagnosis not present

## 2017-07-26 DIAGNOSIS — K219 Gastro-esophageal reflux disease without esophagitis: Secondary | ICD-10-CM | POA: Diagnosis not present

## 2017-07-26 DIAGNOSIS — Z818 Family history of other mental and behavioral disorders: Secondary | ICD-10-CM | POA: Insufficient documentation

## 2017-07-26 DIAGNOSIS — F419 Anxiety disorder, unspecified: Secondary | ICD-10-CM | POA: Diagnosis not present

## 2017-07-26 DIAGNOSIS — J449 Chronic obstructive pulmonary disease, unspecified: Secondary | ICD-10-CM | POA: Insufficient documentation

## 2017-07-26 DIAGNOSIS — Z86010 Personal history of colonic polyps: Secondary | ICD-10-CM | POA: Diagnosis not present

## 2017-07-26 DIAGNOSIS — E7800 Pure hypercholesterolemia, unspecified: Secondary | ICD-10-CM | POA: Insufficient documentation

## 2017-07-26 HISTORY — PX: CHOLECYSTECTOMY: SHX55

## 2017-07-26 HISTORY — DX: Personal history of urinary calculi: Z87.442

## 2017-07-26 LAB — COMPREHENSIVE METABOLIC PANEL
ALBUMIN: 3.7 g/dL (ref 3.5–5.0)
ALK PHOS: 68 U/L (ref 38–126)
ALT: 17 U/L (ref 14–54)
AST: 22 U/L (ref 15–41)
Anion gap: 10 (ref 5–15)
BUN: 14 mg/dL (ref 6–20)
CALCIUM: 8.7 mg/dL — AB (ref 8.9–10.3)
CO2: 28 mmol/L (ref 22–32)
CREATININE: 0.73 mg/dL (ref 0.44–1.00)
Chloride: 105 mmol/L (ref 101–111)
GFR calc Af Amer: >60 mL/min (ref >60)
GFR calc non Af Amer: >60 mL/min (ref >60)
GLUCOSE: 85 mg/dL (ref 65–99)
Potassium: 4 mmol/L (ref 3.5–5.1)
SODIUM: 143 mmol/L (ref 135–145)
Total Bilirubin: 0.7 mg/dL (ref 0.3–1.2)
Total Protein: 6.6 g/dL (ref 6.5–8.1)

## 2017-07-26 LAB — CBC WITH DIFFERENTIAL/PLATELET
Basophils Absolute: 0 10*3/uL (ref 0.0–0.1)
Basophils Relative: 0 %
EOS ABS: 0.3 10*3/uL (ref 0.0–0.7)
Eosinophils Relative: 4 %
HCT: 39.7 % (ref 36.0–46.0)
HEMOGLOBIN: 12.9 g/dL (ref 12.0–15.0)
Lymphocytes Relative: 38 %
Lymphs Abs: 3.6 10*3/uL (ref 0.7–4.0)
MCH: 31.1 pg (ref 26.0–34.0)
MCHC: 32.5 g/dL (ref 30.0–36.0)
MCV: 95.7 fL (ref 78.0–100.0)
Monocytes Absolute: 0.6 10*3/uL (ref 0.1–1.0)
Monocytes Relative: 7 %
NEUTROS PCT: 51 %
Neutro Abs: 4.9 10*3/uL (ref 1.7–7.7)
Platelets: 245 10*3/uL (ref 150–400)
RBC: 4.15 MIL/uL (ref 3.87–5.11)
RDW: 13.3 % (ref 11.5–15.5)
WBC: 9.4 10*3/uL (ref 4.0–10.5)

## 2017-07-26 SURGERY — LAPAROSCOPIC CHOLECYSTECTOMY
Anesthesia: General

## 2017-07-26 MED ORDER — ROCURONIUM BROMIDE 10 MG/ML (PF) SYRINGE
PREFILLED_SYRINGE | INTRAVENOUS | Status: AC
  Filled 2017-07-26: qty 5

## 2017-07-26 MED ORDER — NALOXONE HCL 0.4 MG/ML IJ SOLN
INTRAMUSCULAR | Status: DC | PRN
  Administered 2017-07-26: 40 ug via INTRAVENOUS

## 2017-07-26 MED ORDER — FENTANYL CITRATE (PF) 250 MCG/5ML IJ SOLN
INTRAMUSCULAR | Status: AC
  Filled 2017-07-26: qty 5

## 2017-07-26 MED ORDER — MIDAZOLAM HCL 5 MG/5ML IJ SOLN
INTRAMUSCULAR | Status: DC | PRN
  Administered 2017-07-26: 2 mg via INTRAVENOUS

## 2017-07-26 MED ORDER — LACTATED RINGERS IR SOLN
Status: DC | PRN
  Administered 2017-07-26: 1000 mL

## 2017-07-26 MED ORDER — PROPOFOL 10 MG/ML IV BOLUS
INTRAVENOUS | Status: AC
  Filled 2017-07-26: qty 20

## 2017-07-26 MED ORDER — LACTATED RINGERS IV SOLN
INTRAVENOUS | Status: DC | PRN
  Administered 2017-07-26: 07:00:00 via INTRAVENOUS

## 2017-07-26 MED ORDER — HYDROMORPHONE HCL 1 MG/ML IJ SOLN
INTRAMUSCULAR | Status: AC
  Filled 2017-07-26: qty 1

## 2017-07-26 MED ORDER — CEFAZOLIN SODIUM-DEXTROSE 2-4 GM/100ML-% IV SOLN
2.0000 g | INTRAVENOUS | Status: AC
  Administered 2017-07-26: 2 g via INTRAVENOUS
  Filled 2017-07-26: qty 100

## 2017-07-26 MED ORDER — ONDANSETRON HCL 4 MG/2ML IJ SOLN
INTRAMUSCULAR | Status: DC | PRN
  Administered 2017-07-26: 4 mg via INTRAVENOUS

## 2017-07-26 MED ORDER — DOCUSATE SODIUM 100 MG PO CAPS: 100.0000 mg | ORAL_CAPSULE | Freq: Two times a day (BID) | ORAL | 0 refills | Status: AC

## 2017-07-26 MED ORDER — ROCURONIUM BROMIDE 10 MG/ML (PF) SYRINGE
PREFILLED_SYRINGE | INTRAVENOUS | Status: DC | PRN
  Administered 2017-07-26: 40 mg via INTRAVENOUS

## 2017-07-26 MED ORDER — DEXAMETHASONE SODIUM PHOSPHATE 10 MG/ML IJ SOLN
INTRAMUSCULAR | Status: AC
  Filled 2017-07-26: qty 1

## 2017-07-26 MED ORDER — DEXAMETHASONE SODIUM PHOSPHATE 10 MG/ML IJ SOLN
INTRAMUSCULAR | Status: DC | PRN
  Administered 2017-07-26: 10 mg via INTRAVENOUS

## 2017-07-26 MED ORDER — NALOXONE HCL 0.4 MG/ML IJ SOLN
INTRAMUSCULAR | Status: AC
  Filled 2017-07-26: qty 1

## 2017-07-26 MED ORDER — LACTATED RINGERS IV SOLN
INTRAVENOUS | Status: DC
  Administered 2017-07-26: 1000 mL via INTRAVENOUS

## 2017-07-26 MED ORDER — ACETAMINOPHEN 500 MG PO TABS: 1000.0000 mg | ORAL_TABLET | ORAL | Status: DC

## 2017-07-26 MED ORDER — SUGAMMADEX SODIUM 200 MG/2ML IV SOLN
INTRAVENOUS | Status: DC | PRN
  Administered 2017-07-26: 200 mg via INTRAVENOUS

## 2017-07-26 MED ORDER — MIDAZOLAM HCL 2 MG/2ML IJ SOLN
INTRAMUSCULAR | Status: AC
  Filled 2017-07-26: qty 2

## 2017-07-26 MED ORDER — LIDOCAINE 2% (20 MG/ML) 5 ML SYRINGE
INTRAMUSCULAR | Status: DC | PRN
  Administered 2017-07-26: 100 mg via INTRAVENOUS

## 2017-07-26 MED ORDER — ONDANSETRON HCL 4 MG/2ML IJ SOLN
INTRAMUSCULAR | Status: AC
  Filled 2017-07-26: qty 2

## 2017-07-26 MED ORDER — OXYCODONE-ACETAMINOPHEN 5-325 MG PO TABS: 1.0000 | ORAL_TABLET | Freq: Four times a day (QID) | ORAL | 0 refills | Status: DC | PRN

## 2017-07-26 MED ORDER — PHENYLEPHRINE 40 MCG/ML (10ML) SYRINGE FOR IV PUSH (FOR BLOOD PRESSURE SUPPORT)
PREFILLED_SYRINGE | INTRAVENOUS | Status: DC | PRN
  Administered 2017-07-26 (×4): 80 ug via INTRAVENOUS

## 2017-07-26 MED ORDER — ACETAMINOPHEN 500 MG PO TABS
ORAL_TABLET | ORAL | Status: AC
  Administered 2017-07-26: 1000 mg
  Filled 2017-07-26: qty 2

## 2017-07-26 MED ORDER — CHLORHEXIDINE GLUCONATE 4 % EX LIQD: 60.0000 mL | Freq: Once | CUTANEOUS | Status: DC

## 2017-07-26 MED ORDER — GABAPENTIN 300 MG PO CAPS
ORAL_CAPSULE | ORAL | Status: AC
  Administered 2017-07-26: 300 mg
  Filled 2017-07-26: qty 1

## 2017-07-26 MED ORDER — GABAPENTIN 300 MG PO CAPS: 300.0000 mg | ORAL_CAPSULE | ORAL | Status: DC

## 2017-07-26 MED ORDER — SUGAMMADEX SODIUM 200 MG/2ML IV SOLN
INTRAVENOUS | Status: AC
  Filled 2017-07-26: qty 2

## 2017-07-26 MED ORDER — PROPOFOL 10 MG/ML IV BOLUS
INTRAVENOUS | Status: DC | PRN
  Administered 2017-07-26: 160 mg via INTRAVENOUS

## 2017-07-26 MED ORDER — FENTANYL CITRATE (PF) 100 MCG/2ML IJ SOLN
INTRAMUSCULAR | Status: DC | PRN
  Administered 2017-07-26 (×2): 100 ug via INTRAVENOUS
  Administered 2017-07-26: 50 ug via INTRAVENOUS

## 2017-07-26 MED ORDER — LIDOCAINE 2% (20 MG/ML) 5 ML SYRINGE
INTRAMUSCULAR | Status: AC
  Filled 2017-07-26: qty 5

## 2017-07-26 MED ORDER — HYDROMORPHONE HCL 1 MG/ML IJ SOLN
0.2500 mg | INTRAMUSCULAR | Status: DC | PRN
  Administered 2017-07-26 (×2): 0.5 mg via INTRAVENOUS

## 2017-07-26 MED ORDER — BUPIVACAINE-EPINEPHRINE 0.25% -1:200000 IJ SOLN
INTRAMUSCULAR | Status: DC | PRN
  Administered 2017-07-26: 40 mL

## 2017-07-26 MED ORDER — BUPIVACAINE-EPINEPHRINE 0.25% -1:200000 IJ SOLN
INTRAMUSCULAR | Status: AC
  Filled 2017-07-26: qty 1

## 2017-07-26 SURGICAL SUPPLY — 32 items
APPLIER CLIP ROT 10 11.4 M/L (STAPLE) ×3
CABLE HIGH FREQUENCY MONO STRZ (ELECTRODE) ×3 IMPLANT
CHLORAPREP W/TINT 26ML (MISCELLANEOUS) ×3 IMPLANT
CLIP APPLIE ROT 10 11.4 M/L (STAPLE) ×1 IMPLANT
COVER MAYO STAND STRL (DRAPES) IMPLANT
COVER SURGICAL LIGHT HANDLE (MISCELLANEOUS) ×3 IMPLANT
DECANTER SPIKE VIAL GLASS SM (MISCELLANEOUS) ×3 IMPLANT
DERMABOND ADVANCED (GAUZE/BANDAGES/DRESSINGS) ×2
DERMABOND ADVANCED .7 DNX12 (GAUZE/BANDAGES/DRESSINGS) ×1 IMPLANT
DRAPE C-ARM 42X120 X-RAY (DRAPES) IMPLANT
ELECT REM PT RETURN 15FT ADLT (MISCELLANEOUS) ×3 IMPLANT
GLOVE BIO SURGEON STRL SZ 6 (GLOVE) ×3 IMPLANT
GLOVE INDICATOR 6.5 STRL GRN (GLOVE) ×3 IMPLANT
GOWN STRL REUS W/TWL LRG LVL3 (GOWN DISPOSABLE) ×3 IMPLANT
GOWN STRL REUS W/TWL XL LVL3 (GOWN DISPOSABLE) IMPLANT
GRASPER SUT TROCAR 14GX15 (MISCELLANEOUS) ×3 IMPLANT
HEMOSTAT SNOW SURGICEL 2X4 (HEMOSTASIS) IMPLANT
KIT BASIN OR (CUSTOM PROCEDURE TRAY) ×3 IMPLANT
NEEDLE INSUFFLATION 14GA 120MM (NEEDLE) ×3 IMPLANT
NS IRRIG 1000ML POUR BTL (IV SOLUTION) ×3 IMPLANT
POUCH SPECIMEN RETRIEVAL 10MM (ENDOMECHANICALS) ×3 IMPLANT
SCISSORS LAP 5X35 DISP (ENDOMECHANICALS) ×3 IMPLANT
SET CHOLANGIOGRAPH MIX (MISCELLANEOUS) IMPLANT
SET IRRIG TUBING LAPAROSCOPIC (IRRIGATION / IRRIGATOR) ×3 IMPLANT
SLEEVE XCEL OPT CAN 5 100 (ENDOMECHANICALS) ×6 IMPLANT
SUT MNCRL AB 4-0 PS2 18 (SUTURE) ×3 IMPLANT
TOWEL OR 17X26 10 PK STRL BLUE (TOWEL DISPOSABLE) ×3 IMPLANT
TOWEL OR NON WOVEN STRL DISP B (DISPOSABLE) ×3 IMPLANT
TRAY LAPAROSCOPIC (CUSTOM PROCEDURE TRAY) ×3 IMPLANT
TROCAR BLADELESS OPT 5 100 (ENDOMECHANICALS) ×3 IMPLANT
TROCAR XCEL 12X100 BLDLESS (ENDOMECHANICALS) ×3 IMPLANT
TUBING INSUF HEATED (TUBING) ×3 IMPLANT

## 2017-07-26 NOTE — Anesthesia Postprocedure Evaluation (Signed)
Anesthesia Post Note  Patient: [facility]  Procedure(s) Performed: LAPAROSCOPIC CHOLECYSTECTOMY (N/A )     Patient location during evaluation: PACU Anesthesia Type: General Level of consciousness: awake and alert Pain management: pain level controlled Vital Signs Assessment: post-procedure vital signs reviewed and stable Respiratory status: spontaneous breathing, nonlabored ventilation and respiratory function stable Cardiovascular status: blood pressure returned to baseline and stable Postop Assessment: no apparent nausea or vomiting Anesthetic complications: no    Last Vitals:  Vitals:   07/26/17 0957 07/26/17 1020  BP: (!) 157/87 (!) 148/72  Pulse: 95 (!) 107  Resp: 20 20  Temp: 36.8 C   SpO2: 94% 93%    Last Pain:  Vitals:   07/26/17 1020  TempSrc:   PainSc: 3                  Cyle Kenyon,W. EDMOND

## 2017-07-26 NOTE — Op Note (Signed)
Operative Note  Valerie Key 57 y.o. female [id number]  07/26/2017  Surgeon: Clovis Riley MD  Assistant: OR saff  Procedure performed: Laparoscopic Cholecystectomy  Preop diagnosis: biliary colic Post-op diagnosis/intraop findings: same  Specimens: gallbladder  EBL: minimal  Complications: none  Description of procedure: After obtaining informed consent the patient was brought to the operating room. Prophylactic antibiotics and subcutaneous heparin were administered. SCD's were applied. General endotracheal anesthesia was initiated and a formal time-out was performed. The abdomen was prepped and draped in the usual sterile fashion and the abdomen was entered using an infraumbilical veress needle after instilling the site with local.  Insufflation to 44mmHg was obtained, 28mm trocar and camera introduced and gross inspection revealed no evidence of injury from our entry or other intraabdominal abnormalities. Two 9mm trocars were introduced in the right midclavicular and right anterior axillary lines under direct visualization and following infiltration with local. An 85mm trocar was placed in the epigastrium. The gallbladder was retracted cephalad and the infundibulum was retracted laterally. A combination of hook electrocautery and blunt dissection was utilized to clear the peritoneum from the neck and cystic duct, circumferentially isolating the cystic artery and cystic duct and lifting the gallbladder from the cystic plate. The critical view of safety was achieved with the cystic artery, cystic duct, and liver bed visualized between them with no other structures. The artery was clipped with a single clip proximally and distally and divided as was the cystic duct with three clips on the proximal end. The gallbladder was dissected from the liver plate using electrocautery. Once freed the gallbladder was placed in an endocatch bag and removed intact through the epigastric trocar  site. Hemostasis was once again confirmed, and reinspection of the abdomen revealed no injuries. The clips were well opposed without any bile leak from the duct or the liver bed. The 23mm trocar site in the epigastrium was closed with two interrupted 0 vicryls in the fascia under direct visualization using a PMI device. The abdomen was desufflated and all trocars removed. The skin incisions were closed with running subcuticular monocryl and Dermabond. The patient was awakened, extubated and transported to the recovery room in stable condition.   All counts were correct at the completion of the case.

## 2017-07-26 NOTE — Discharge Instructions (Signed)
LAPAROSCOPIC SURGERY: POST OP INSTRUCTIONS  ######################################################################  EAT Gradually transition to a high fiber diet with a fiber supplement over the next few weeks after discharge.  Start with a pureed / full liquid diet (see below)  WALK Walk an hour a day.  Control your pain to do that.    CONTROL PAIN Control pain so that you can walk, sleep, tolerate sneezing/coughing, go up/down stairs.  HAVE A BOWEL MOVEMENT DAILY Keep your bowels regular to avoid problems.  OK to try a laxative to override constipation.  OK to use an antidairrheal to slow down diarrhea.  Call if not better after 2 tries  CALL IF YOU HAVE PROBLEMS/CONCERNS Call if you are still struggling despite following these instructions. Call if you have concerns not answered by these instructions  ######################################################################    1. DIET: Follow a light bland diet the first 24 hours after arrival home, such as soup, liquids, crackers, etc.  Be sure to include lots of fluids daily.  Avoid fast food or heavy meals as your are more likely to get nauseated.  Eat a low fat the next few days after surgery.   2. Take your usually prescribed home medications unless otherwise directed. 3. PAIN CONTROL: a. Pain is best controlled by a usual combination of three different methods TOGETHER: i. Ice/Heat ii. Over the counter pain medication iii. Prescription pain medication b. Most patients will experience some swelling and bruising around the incisions.  Ice packs or heating pads (30-60 minutes up to 6 times a day) will help. Use ice for the first few days to help decrease swelling and bruising, then switch to heat to help relax tight/sore spots and speed recovery.  Some people prefer to use ice alone, heat alone, alternating between ice & heat.  Experiment to what works for you.  Swelling and bruising can take several weeks to resolve.   c. It is  helpful to take an over-the-counter pain medication regularly for the first few weeks.  Choose one of the following that works best for you: i. Naproxen (Aleve, etc)  Two 251m tabs twice a day ii. Ibuprofen (Advil, etc) Three 2053mtabs four times a day (every meal & bedtime) iii. Acetaminophen (Tylenol, etc) 500-65044mour times a day (every meal & bedtime) d. A  prescription for pain medication (such as oxycodone, hydrocodone, etc) should be given to you upon discharge.  Take your pain medication as prescribed.  i. If you are having problems/concerns with the prescription medicine (does not control pain, nausea, vomiting, rash, itching, etc), please call us [phone] see if we need to switch you to a different pain medicine that will work better for you and/or control your side effect better. ii. If you need a refill on your pain medication, please contact your pharmacy.  They will contact our office to request authorization. Prescriptions will not be filled after 5 pm or on week-ends. 4. Avoid getting constipated.  Between the surgery and the pain medications, it is common to experience some constipation.  Increasing fluid intake and taking a fiber supplement (such as Metamucil, Citrucel, FiberCon, MiraLax, etc) 1-2 times a day regularly will usually help prevent this problem from occurring.  A mild laxative (prune juice, Milk of Magnesia, MiraLax, etc) should be taken according to package directions if there are no bowel movements after 48 hours.   5. Watch out for diarrhea.  If you have many loose bowel movements, simplify your diet to bland foods & liquids for  a few days.  Stop any stool softeners and decrease your fiber supplement.  Switching to mild anti-diarrheal medications (Kayopectate, Pepto Bismol) can help.  If this worsens or does not improve, please call us. 6. Wash / shower every day. (starting the day after surgery)  You may shower over the skin glue which is waterproof.  Continue  to shower over incision(s) after the dressing is off. 7. Skin glue will flake off after about 2 weeks.  You may leave the incision open to air.  You may replace a dressing/Band-Aid to cover the incision for comfort if you wish.  8. ACTIVITIES as tolerated:   a. You may resume regular (light) daily activities beginning the next day--such as daily self-care, walking, climbing stairs--gradually increasing activities as tolerated.  If you can walk 30 minutes without difficulty, it is safe to try more intense activity such as jogging, treadmill, bicycling, low-impact aerobics, swimming, etc. b. Save the most intensive and strenuous activity for last such as sit-ups, heavy lifting, contact sports, etc  Refrain from any heavy lifting or straining until you are off narcotics for pain control.   c. DO NOT PUSH THROUGH PAIN.  Let pain be your guide: If it hurts to do something, don't do it.  Pain is your body warning you to avoid that activity for another week until the pain goes down. d. You may drive when you are no longer taking prescription pain medication, you can comfortably wear a seatbelt, and you can safely maneuver your car and apply brakes. e. Dennis Bast may have sexual intercourse when it is comfortable.  9. FOLLOW UP in our office a. Please call [facility] at [phone] to set up an appointment to see your surgeon in the office for a follow-up appointment approximately 2-3 weeks after your surgery. b. Make sure that you call for this appointment the day you arrive home to insure a convenient appointment time. 10. IF YOU HAVE DISABILITY OR FAMILY LEAVE FORMS, BRING THEM TO THE OFFICE FOR PROCESSING.  DO NOT GIVE THEM TO YOUR DOCTOR.   WHEN TO CALL us [phone]: 1. Poor pain control 2. Reactions / problems with new medications (rash/itching, nausea, etc)  3. Fever over 101.5 F (38.5 C) 4. Inability to urinate 5. Nausea and/or vomiting 6. Worsening swelling or bruising 7. Continued bleeding from  incision. 8. Increased pain, redness, or drainage from the incision   The clinic staff is available to answer your questions during regular business hours (8:30am-5pm).  Please dont hesitate to call and ask to speak to one of our nurses for clinical concerns.   If you have a medical emergency, go to the nearest emergency room or call 911.  A surgeon from [facility] is always on call at the [facility], [place], [place], [place], [place]  [address] ? MAIN: [phone] ? TOLL FREE: [phone] ?  FAX [phone] [email or web address]

## 2017-07-26 NOTE — Interval H&P Note (Signed)
History and Physical Interval Note:  07/26/2017 7:01 AM  Valerie Key  has presented today for surgery, with the diagnosis of BILIARY COLIC  The various methods of treatment have been discussed with the patient and family. After consideration of risks, benefits and other options for treatment, the patient has consented to  Procedure(s): LAPAROSCOPIC CHOLECYSTECTOMY (N/A) as a surgical intervention .  The patient's history has been reviewed, patient examined, no change in status, stable for surgery.  I have reviewed the patient's chart and labs.  Questions were answered to the patient's satisfaction.     Shaunn Tackitt Rich Brave

## 2017-07-26 NOTE — Anesthesia Procedure Notes (Signed)
Procedure Name: Intubation Date/Time: 07/26/2017 7:28 AM Performed by: Ediel Unangst D, CRNA Pre-anesthesia Checklist: Patient identified, Emergency Drugs available, Suction available and Patient being monitored Patient Re-evaluated:Patient Re-evaluated prior to induction Oxygen Delivery Method: Circle system utilized Preoxygenation: Pre-oxygenation with 100% oxygen Induction Type: IV induction Ventilation: Mask ventilation without difficulty Laryngoscope Size: Mac and 3 Grade View: Grade I Tube type: Oral Tube size: 7.5 mm Number of attempts: 1 Airway Equipment and Method: Stylet Placement Confirmation: ETT inserted through vocal cords under direct vision,  positive ETCO2 and breath sounds checked- equal and bilateral Secured at: 21 cm Tube secured with: Tape Dental Injury: Teeth and Oropharynx as per pre-operative assessment

## 2017-07-26 NOTE — Transfer of Care (Signed)
Immediate Anesthesia Transfer of Care Note  Patient: [place]  Procedure(s) Performed: LAPAROSCOPIC CHOLECYSTECTOMY (N/A )  Patient Location: PACU  Anesthesia Type:General  Level of Consciousness: awake, alert  and oriented  Airway & Oxygen Therapy: Patient Spontanous Breathing and Patient connected to face mask oxygen  Post-op Assessment: Report given to RN and Post -op Vital signs reviewed and stable  Post vital signs: Reviewed and stable  Last Vitals:  Vitals:   07/26/17 0557  BP: 138/84  Pulse: 74  Resp: 16  Temp: 36.8 C  SpO2: 95%    Last Pain:  Vitals:   07/26/17 0707  TempSrc:   PainSc: 4       Patients Stated Pain Goal: 3 (56/86/16 8372)  Complications: No apparent anesthesia complications

## 2017-07-29 ENCOUNTER — Encounter (HOSPITAL_COMMUNITY): Payer: Self-pay | Admitting: Surgery

## 2017-08-29 ENCOUNTER — Emergency Department (HOSPITAL_COMMUNITY): Payer: Commercial Managed Care - PPO

## 2017-08-29 ENCOUNTER — Emergency Department (HOSPITAL_COMMUNITY)
Admission: EM | Admit: 2017-08-29 | Discharge: 2017-08-29 | Disposition: A | Discharge status: Home / Self Care | Payer: Commercial Managed Care - PPO | Attending: Emergency Medicine | Admitting: Emergency Medicine

## 2017-08-29 ENCOUNTER — Encounter (HOSPITAL_COMMUNITY): Payer: Self-pay

## 2017-08-29 ENCOUNTER — Other Ambulatory Visit: Payer: Self-pay

## 2017-08-29 DIAGNOSIS — F17210 Nicotine dependence, cigarettes, uncomplicated: Secondary | ICD-10-CM | POA: Diagnosis not present

## 2017-08-29 DIAGNOSIS — J449 Chronic obstructive pulmonary disease, unspecified: Secondary | ICD-10-CM | POA: Diagnosis not present

## 2017-08-29 DIAGNOSIS — J45909 Unspecified asthma, uncomplicated: Secondary | ICD-10-CM | POA: Diagnosis not present

## 2017-08-29 DIAGNOSIS — R350 Frequency of micturition: Secondary | ICD-10-CM | POA: Diagnosis not present

## 2017-08-29 DIAGNOSIS — R1031 Right lower quadrant pain: Secondary | ICD-10-CM | POA: Insufficient documentation

## 2017-08-29 DIAGNOSIS — R3915 Urgency of urination: Secondary | ICD-10-CM | POA: Diagnosis not present

## 2017-08-29 DIAGNOSIS — Z79899 Other long term (current) drug therapy: Secondary | ICD-10-CM | POA: Diagnosis not present

## 2017-08-29 DIAGNOSIS — N3001 Acute cystitis with hematuria: Secondary | ICD-10-CM | POA: Diagnosis not present

## 2017-08-29 DIAGNOSIS — R109 Unspecified abdominal pain: Secondary | ICD-10-CM | POA: Diagnosis present

## 2017-08-29 LAB — URINALYSIS, ROUTINE W REFLEX MICROSCOPIC
BILIRUBIN URINE: NEGATIVE
Bacteria, UA: NONE SEEN
Glucose, UA: NEGATIVE mg/dL
KETONES UR: NEGATIVE mg/dL
LEUKOCYTES UA: NEGATIVE
NITRITE: NEGATIVE
Protein, ur: NEGATIVE mg/dL
Specific Gravity, Urine: 1.01 (ref 1.005–1.030)
pH: 7 (ref 5.0–8.0)

## 2017-08-29 LAB — CBC WITH DIFFERENTIAL/PLATELET
Basophils Absolute: 0 10*3/uL (ref 0.0–0.1)
Basophils Relative: 0 %
Eosinophils Absolute: 0.3 10*3/uL (ref 0.0–0.7)
Eosinophils Relative: 3 %
HCT: 41.9 % (ref 36.0–46.0)
Hemoglobin: 13.3 g/dL (ref 12.0–15.0)
Lymphocytes Relative: 34 %
Lymphs Abs: 3.5 10*3/uL (ref 0.7–4.0)
MCH: 30.4 pg (ref 26.0–34.0)
MCHC: 31.7 g/dL (ref 30.0–36.0)
MCV: 95.9 fL (ref 78.0–100.0)
Monocytes Absolute: 0.8 10*3/uL (ref 0.1–1.0)
Monocytes Relative: 8 %
Neutro Abs: 5.6 10*3/uL (ref 1.7–7.7)
Neutrophils Relative %: 55 %
Platelets: 216 10*3/uL (ref 150–400)
RBC: 4.37 MIL/uL (ref 3.87–5.11)
RDW: 12.8 % (ref 11.5–15.5)
WBC: 10.2 10*3/uL (ref 4.0–10.5)

## 2017-08-29 LAB — LIPASE, BLOOD: Lipase: 27 U/L (ref 11–51)

## 2017-08-29 LAB — COMPREHENSIVE METABOLIC PANEL
ALT: 13 U/L — ABNORMAL LOW (ref 14–54)
AST: 17 U/L (ref 15–41)
Albumin: 4 g/dL (ref 3.5–5.0)
Alkaline Phosphatase: 76 U/L (ref 38–126)
Anion gap: 10 (ref 5–15)
BUN: 10 mg/dL (ref 6–20)
CO2: 30 mmol/L (ref 22–32)
Calcium: 9.1 mg/dL (ref 8.9–10.3)
Chloride: 103 mmol/L (ref 101–111)
Creatinine, Ser: 0.73 mg/dL (ref 0.44–1.00)
GFR calc Af Amer: >60 mL/min (ref >60)
GFR calc non Af Amer: >60 mL/min (ref >60)
Glucose, Bld: 90 mg/dL (ref 65–99)
Potassium: 3.7 mmol/L (ref 3.5–5.1)
Sodium: 143 mmol/L (ref 135–145)
Total Bilirubin: 0.6 mg/dL (ref 0.3–1.2)
Total Protein: 7.2 g/dL (ref 6.5–8.1)

## 2017-08-29 LAB — POC URINE PREG, ED: Preg Test, Ur: NEGATIVE

## 2017-08-29 MED ORDER — SODIUM CHLORIDE 0.9 % IV BOLUS
1000.0000 mL | Freq: Once | INTRAVENOUS | Status: AC
  Administered 2017-08-29: 1000 mL via INTRAVENOUS

## 2017-08-29 MED ORDER — KETOROLAC TROMETHAMINE 30 MG/ML IJ SOLN
30.0000 mg | Freq: Once | INTRAMUSCULAR | Status: AC
  Administered 2017-08-29: 30 mg via INTRAVENOUS
  Filled 2017-08-29: qty 1

## 2017-08-29 NOTE — Discharge Instructions (Signed)
Continue taking your antibiotics to completion.  Drink plenty of water and get plenty of rest.  Call your primary care doctor to follow-up on the sensitivities of the urine culture that was obtained to make sure that you are being treated with the right antibiotic.  Follow-up with urology if symptoms persist.  Return to the emergency department if any concerning signs or symptoms develop such as fever, persisting nausea and vomiting, or worsening pain.

## 2017-08-29 NOTE — ED Triage Notes (Signed)
Patient c/o right flank pain since 0730 today. Patient reports that she went to an UC 2 days ago and was prescribed an antibiotic for a UTI.

## 2017-08-29 NOTE — ED Provider Notes (Signed)
[place] DEPT Provider Note   CSN: [id number] Arrival date & time: 08/29/17  0845     History   Chief Complaint Chief Complaint  Patient presents with  . Flank Pain    HPI Valerie Key is a 57 y.o. female history of COPD, nephrolithiasis, HLD, hiatal hernia presents today for evaluation of gradual onset, progressively worsening hematuria and right flank pain.  She states that 3 days ago she noted gross hematuria without any other urinary symptoms and went to her primary care doctor through her workplace.  She states that they thought her urine suggested UTI and discharged her with levofloxacin which she has been taking for the past 3 days.  Today she notes acute onset of intermittent sharp right flank pain with radiation to the right lower quadrant of the abdomen.  She does note urinary frequency and urgency but denies dysuria.  She denies nausea, vomiting, diarrhea, constipation, chest pain, shortness of breath, fevers, or chills.  She has not tried anything else for her symptoms.  Of note, patient recently underwent laprascopic cholecystectomy 1 month ago.  She is a current smoker of approximately a pack and half cigarettes daily.  The history is provided by the patient.    Past Medical History:  Diagnosis Date  . Allergy    SEASONAL  . Anxiety   . Arthritis   . Asthma   . Back pain   . COPD (chronic obstructive pulmonary disease) ([place])   . Depression   . Gastritis   . GERD (gastroesophageal reflux disease)   . Hiatal hernia   . History of kidney stones   . Hyperlipidemia   . Renal disorder     Patient Active Problem List   Diagnosis Date Noted  . Dyspepsia 02/12/2012  . Degenerative disc disease 01/02/2012  . Chronic back pain 01/02/2012    Past Surgical History:  Procedure Laterality Date  . ABDOMINAL HYSTERECTOMY     with bladder tact  . BLADDER TACK    . CHOLECYSTECTOMY N/A 07/26/2017   Procedure: LAPAROSCOPIC  CHOLECYSTECTOMY;  Surgeon: Clovis Riley, MD;  Location: [facility];  Service: General;  Laterality: N/A;     OB History   None      Home Medications    Prior to Admission medications   Medication Sig Start Date End Date Taking? Authorizing Provider  ALPRAZolam Duanne Moron) 0.5 MG tablet Take 0.25 mg by mouth daily as needed for anxiety.    Yes [provider]  citalopram (CELEXA) 20 MG tablet Take 20 mg by mouth at bedtime.  05/16/17  Yes [provider]  levofloxacin (LEVAQUIN) 750 MG tablet Take 750 mg by mouth daily.   Yes [provider]  loratadine (CLARITIN) 10 MG tablet Take 10 mg by mouth daily.   Yes [provider]  oxyCODONE-acetaminophen (PERCOCET/ROXICET) 5-325 MG tablet Take 1 tablet by mouth every 6 (six) hours as needed for severe pain. 07/26/17  Yes Clovis Riley, MD  pantoprazole (PROTONIX) 40 MG tablet Take 1 tab with breakfast every morning. Patient taking differently: Take 40 mg by mouth daily.  06/14/17  Yes Esterwood, Amy S, PA-C  PROAIR HFA 108 (90 Base) MCG/ACT inhaler Take 1 puff by mouth every 4 (four) hours as needed for shortness of breath. 06/28/17  Yes [provider]  rosuvastatin (CRESTOR) 20 MG tablet Take 20 mg by mouth daily. 06/28/17  Yes [provider]  tiZANidine (ZANAFLEX) 2 MG tablet Take 2 mg by  mouth every 8 (eight) hours as needed for muscle spasms.    Yes [provider]  potassium chloride SA (K-DUR,KLOR-CON) 20 MEQ tablet Take 1 tablet (20 mEq total) by mouth daily. Patient not taking: Reported on 08/29/2017 07/09/17   Lajean Saver, MD  ranitidine (ZANTAC) 150 MG capsule Take 150 mg by mouth daily as needed for heartburn.     [provider]    Family History Family History  Problem Relation Age of Onset  . Diabetes Father   . Colon cancer Paternal Uncle        x 2  . Heart disease Maternal Grandfather   . Heart disease Paternal Grandfather     Social History Social  History   Tobacco Use  . Smoking status: Current Every Day Smoker    Packs/day: 1.00    Types: Cigarettes  . Smokeless tobacco: Never Used  Substance Use Topics  . Alcohol use: Yes    Comment: 1-2 daily  . Drug use: No     Allergies   Sulfa antibiotics   Review of Systems Review of Systems  Constitutional: Negative for chills and fever.  Respiratory: Negative for shortness of breath.   Cardiovascular: Negative for chest pain.  Gastrointestinal: Positive for abdominal pain. Negative for constipation, diarrhea, nausea and vomiting.  Genitourinary: Positive for flank pain, frequency, hematuria and urgency. Negative for dysuria, vaginal bleeding, vaginal discharge and vaginal pain.  All other systems reviewed and are negative.    Physical Exam Updated Vital Signs BP 119/74 (BP Location: Left Arm)   Pulse 90   Temp 98.1 F (36.7 C) (Oral)   Resp 16   Ht 5\' 4"  (1.626 m)   Wt 61.2 kg (135 lb)   SpO2 96%   BMI 23.17 kg/m   Physical Exam  Constitutional: She appears well-developed and well-nourished. No distress.  HENT:  Head: Normocephalic and atraumatic.  Eyes: Conjunctivae are normal. Right eye exhibits no discharge. Left eye exhibits no discharge.  Neck: No JVD present. No tracheal deviation present.  Cardiovascular: Normal rate, regular rhythm and normal heart sounds.  Pulmonary/Chest: Effort normal and breath sounds normal.  Abdominal: Soft. Bowel sounds are normal. She exhibits no distension. There is tenderness. There is guarding.  Well-healing lap scopic surgical incisions noted to the abdomen.  She has tenderness to palpation in the right upper quadrant, right lower quadrant, suprapubic region, and left lower quadrant.  No CVA tenderness bilaterally  Musculoskeletal: Normal range of motion. She exhibits tenderness. She exhibits no edema.  No midline spine tenderness to palpation but right paralumbar muscle tenderness along the level of L3/L4.  No deformity,  crepitus, or step-off noted.  Neurological: She is alert.  Skin: Skin is warm and dry. No erythema.  Psychiatric: She has a normal mood and affect. Her behavior is normal.  Nursing note and vitals reviewed.    ED Treatments / Results  Labs (all labs ordered are listed, but only abnormal results are displayed) Labs Reviewed  URINALYSIS, ROUTINE W REFLEX MICROSCOPIC - Abnormal; Notable for the following components:      Result Value   Hgb urine dipstick SMALL (*)    Squamous Epithelial / LPF 6-30 (*)    All other components within normal limits  COMPREHENSIVE METABOLIC PANEL - Abnormal; Notable for the following components:   ALT 13 (*)    All other components within normal limits  URINE CULTURE  LIPASE, BLOOD  CBC WITH DIFFERENTIAL/PLATELET  POC URINE PREG, ED    EKG  None  Radiology Ct Renal Stone Study  Result Date: 08/29/2017 CLINICAL DATA:  Acute right flank pain. EXAM: CT ABDOMEN AND PELVIS WITHOUT CONTRAST TECHNIQUE: Multidetector CT imaging of the abdomen and pelvis was performed following the standard protocol without IV contrast. COMPARISON:  CT scan of Oct 18, 2014. FINDINGS: Lower chest: No acute abnormality. Hepatobiliary: No focal liver abnormality is seen. Status post cholecystectomy. No biliary dilatation. Pancreas: Unremarkable. No pancreatic ductal dilatation or surrounding inflammatory changes. Spleen: Normal in size without focal abnormality. Adrenals/Urinary Tract: Adrenal glands are unremarkable. Small nonobstructive left renal calculi are noted. No hydronephrosis or renal obstruction is noted. Bladder is unremarkable. Stomach/Bowel: Stomach is within normal limits. Appendix appears normal. No evidence of bowel wall thickening, distention, or inflammatory changes. Sigmoid diverticulosis is noted without inflammation. Vascular/Lymphatic: Aortic atherosclerosis. No enlarged abdominal or pelvic lymph nodes. Reproductive: Status post hysterectomy. No adnexal masses.  Other: No abdominal wall hernia or abnormality. No abdominopelvic ascites. Musculoskeletal: No acute or significant osseous findings. IMPRESSION: Sigmoid diverticulosis without inflammation. Mild left nephrolithiasis. No hydronephrosis or renal obstruction is noted. Aortic Atherosclerosis (ICD10-I70.0). Electronically Signed   By: Marijo Conception, M.D.   On: 08/29/2017 12:24    Procedures Procedures (including critical care time)  Medications Ordered in ED Medications  sodium chloride 0.9 % bolus 1,000 mL (0 mLs Intravenous Stopped 08/29/17 1143)  ketorolac (TORADOL) 30 MG/ML injection 30 mg (30 mg Intravenous Given 08/29/17 1010)     Initial Impression / Assessment and Plan / ED Course  I have reviewed the triage vital signs and the nursing notes.  Pertinent labs & imaging results that were available during my care of the patient were reviewed by me and considered in my medical decision making (see chart for details).     Patient presents with right flank pain which began today as well as 3 days of hematuria and urinary symptoms.  She is afebrile, initially very mildly tachycardic but otherwise vital signs stable.  She is nontoxic in appearance.  She has some tenderness to palpation of the right paralumbar region with no CVA tenderness.  Lab work reviewed by me shows no leukocytosis, no significant electrolyte abnormalities.  UA shows 6-30 RBCs but otherwise not concerning for fulminant UTI.  Patient called her primary care physician while in the ED who informed me that patient's urine culture was positive for E. coli but they had not received sensitivities back yet.  She has been on Levaquin for 3 days.  Patient also tells me that when she went to the restroom she thinks she passed a kidney stone as she noted a small stone in the commode.  She then underwent CT renal stone study which showed sigmoid diverticulosis without evidence of sigmoid diverticulitis.  Also showed mild left nephrolithiasis  with no evidence of hydronephrosis or obstruction.  Suspect patient did have a kidney stone on the right side which she passed while in the ED and was the source of her pain.  Her urine was sent for culture.  I encouraged her to continue taking her Levaquin in its entirety and follow-up with her primary care physician for follow-up of her urine sensitivity.  She is resting comfortably in no apparent distress, tolerating p.o. food and fluids.  Repeat abdominal examination is unremarkable.  She states her symptoms entirely resolved after passing the stone.  I doubt obstruction, perforation, appendicitis, colitis, or other acute surgical abdominal pathology.  She will follow-up with her PCP on an outpatient basis as well as urology  if her symptoms persist.  Discussed indications for return to the ED. Pt verbalized understanding of and agreement with plan and is safe for discharge home at this time.   Final Clinical Impressions(s) / ED Diagnoses   Final diagnoses:  Right flank pain  Acute cystitis with hematuria    ED Discharge Orders    None       Debroah Baller 08/29/17 1356    Jola Schmidt, MD 09/06/17 1214

## 2017-08-30 LAB — URINE CULTURE: Culture: NO GROWTH

## 2017-10-02 ENCOUNTER — Other Ambulatory Visit: Payer: Self-pay | Admitting: Physical Medicine and Rehabilitation

## 2017-10-02 DIAGNOSIS — M5416 Radiculopathy, lumbar region: Secondary | ICD-10-CM

## 2017-10-12 ENCOUNTER — Ambulatory Visit
Admission: RE | Admit: 2017-10-12 | Discharge: 2017-10-12 | Disposition: A | Discharge status: Home / Self Care | Payer: Commercial Managed Care - PPO | Source: Ambulatory Visit | Attending: Physical Medicine and Rehabilitation | Admitting: Physical Medicine and Rehabilitation

## 2017-10-12 DIAGNOSIS — M5416 Radiculopathy, lumbar region: Secondary | ICD-10-CM

## 2018-03-06 ENCOUNTER — Other Ambulatory Visit: Payer: Self-pay | Admitting: Physical Medicine and Rehabilitation

## 2018-03-06 DIAGNOSIS — M47812 Spondylosis without myelopathy or radiculopathy, cervical region: Secondary | ICD-10-CM

## 2018-03-08 ENCOUNTER — Ambulatory Visit
Admission: RE | Admit: 2018-03-08 | Discharge: 2018-03-08 | Disposition: A | Discharge status: Home / Self Care | Payer: Self-pay | Source: Ambulatory Visit | Attending: Physical Medicine and Rehabilitation | Admitting: Physical Medicine and Rehabilitation

## 2018-03-08 DIAGNOSIS — M47812 Spondylosis without myelopathy or radiculopathy, cervical region: Secondary | ICD-10-CM

## 2018-03-13 ENCOUNTER — Other Ambulatory Visit (INDEPENDENT_AMBULATORY_CARE_PROVIDER_SITE_OTHER): Payer: Commercial Managed Care - PPO

## 2018-03-13 ENCOUNTER — Ambulatory Visit: Payer: Commercial Managed Care - PPO | Admitting: Physician Assistant

## 2018-03-13 ENCOUNTER — Encounter: Payer: Self-pay | Admitting: Physician Assistant

## 2018-03-13 VITALS — BP 148/80 | HR 105 | Ht 64.0 in | Wt 147.0 lb

## 2018-03-13 DIAGNOSIS — K760 Fatty (change of) liver, not elsewhere classified: Secondary | ICD-10-CM | POA: Diagnosis not present

## 2018-03-13 DIAGNOSIS — R195 Other fecal abnormalities: Secondary | ICD-10-CM | POA: Diagnosis not present

## 2018-03-13 LAB — CBC WITH DIFFERENTIAL/PLATELET
Basophils Absolute: 0.1 10*3/uL (ref 0.0–0.1)
Basophils Relative: 0.5 % (ref 0.0–3.0)
EOS ABS: 0.4 10*3/uL (ref 0.0–0.7)
Eosinophils Relative: 3.9 % (ref 0.0–5.0)
HCT: 40.6 % (ref 36.0–46.0)
Hemoglobin: 13.3 g/dL (ref 12.0–15.0)
LYMPHS ABS: 3.5 10*3/uL (ref 0.7–4.0)
Lymphocytes Relative: 37.9 % (ref 12.0–46.0)
MCHC: 32.8 g/dL (ref 30.0–36.0)
MCV: 93.3 fl (ref 78.0–100.0)
MONO ABS: 0.7 10*3/uL (ref 0.1–1.0)
Monocytes Relative: 7.9 % (ref 3.0–12.0)
NEUTROS ABS: 4.7 10*3/uL (ref 1.4–7.7)
NEUTROS PCT: 49.8 % (ref 43.0–77.0)
PLATELETS: 220 10*3/uL (ref 150.0–400.0)
RBC: 4.36 Mil/uL (ref 3.87–5.11)
RDW: 13.5 % (ref 11.5–15.5)
WBC: 9.4 10*3/uL (ref 4.0–10.5)

## 2018-03-13 MED ORDER — PANTOPRAZOLE SODIUM 40 MG PO TBEC: DELAYED_RELEASE_TABLET | ORAL | 1 refills | Status: DC

## 2018-03-13 MED ORDER — CHOLESTYRAMINE 4 G PO PACK: PACK | ORAL | 2 refills | Status: DC

## 2018-03-13 NOTE — Patient Instructions (Addendum)
  Please go to the basement level to have your labs drawn. We sent prescriptions to  [facility] [address]. 1. Pantoprazole sodium 40 mg - take twice daily for 2 months then go to once daily. Call us when your close to being out and we will send the script for once daily.  2. Questran 4 gram pack  Stop the Carafate.  You have been scheduled for a colonoscopy. Please follow written instructions given to you at your visit today.  Please pick up your prep supplies at the pharmacy within the next 1-3 days. If you use inhalers (even only as needed), please bring them with you on the day of your procedure.  Normal BMI (Body Mass Index- based on height and weight) is between 19 and 25. Your BMI today is Body mass index is 25.23 kg/m. Marland Kitchen Please consider follow up  regarding your BMI with your Primary Care Provider.

## 2018-03-13 NOTE — Progress Notes (Addendum)
Subjective:    Patient ID: Valerie Key, female    DOB: [birth year], 57 y.o.   MRN: [id number]  HPI Valerie Key is a pleasant 57 year old female known to Dr. Hilarie Fredrickson and myself.  She was last seen in January 2019 and then underwent endoscopy and colonoscopy. EGD showed a 1 cm hiatal hernia and was otherwise negative.  Gastric biopsies negative for H. pylori.  Colonoscopy showed multiple sigmoid diverticuli and she had one 5 mm polyp the rectum which was sessile.  Biopsy showed an inflammatory polyp. She also underwent work-up for abnormal liver on CT scan December 2018 which had shown diffused increased echogenicity and subtle nodularity/question early cirrhosis.  Autoimmune markers, hepatitis serologies and markers for inheritable forms of liver disease were all normal or negative including ferritin. She comes in today stating that she is been having dark stools over the past couple of months.  She had her gallbladder removed in February 2019 and says that after her surgery she started having some urgency and diarrhea postprandially is generally having 3-4 bowel movements per day.  She says over the past few months she continues with that pattern but the stools had gotten progressively darker, and she describes "coffee ground" type material in the bowel movements. She saw the primary care physician at her [employer] recently who did labs and then put her on Carafate 4 times daily and said she may have an ulcer.  She had already been on Protonix 40 mg every morning chronically for GERD. We did obtain copies of those labs done on 02/28/2018, cholesterol is 154 triglycerides 398 HDL 45 liver tests are normal hemoglobin A1c 5.8, WBC of 9 hemoglobin 12.7 and hematocrit of 38.7 MCV of 92.  She says that the Carafate is made her very constipated and she wants to know if she can stop it.  She denies any regular aspirin or NSAID use and is not been on any other new medications, she  has not been using any Pepto-Bismol.  She says over the past few days her stools have actually looked a bit lighter.  Her appetite is been fine weight has been stable no complaints of nausea.    Review of Systems Pertinent positive and negative review of systems were noted in the above HPI section.  All other review of systems was otherwise negative.  Outpatient Encounter Medications as of 03/13/2018  Medication Sig  . ALPRAZolam (XANAX) 0.5 MG tablet Take 0.25 mg by mouth daily as needed for anxiety.   . citalopram (CELEXA) 20 MG tablet Take 20 mg by mouth at bedtime.   . Coenzyme Q10 (COQ10) 100 MG CAPS Take by mouth.  Marland Kitchen lisinopril (PRINIVIL,ZESTRIL) 10 MG tablet Take 10 mg by mouth daily.  Marland Kitchen loratadine (CLARITIN) 10 MG tablet Take 10 mg by mouth daily.  . multivitamin-iron-minerals-folic acid (CENTRUM) chewable tablet Chew 1 tablet by mouth daily.  Marland Kitchen oxyCODONE-acetaminophen (PERCOCET/ROXICET) 5-325 MG tablet Take 1 tablet by mouth every 6 (six) hours as needed for severe pain.  . pantoprazole (PROTONIX) 40 MG tablet Take 1 tab by mouth twice daily, before breakfast and dinner , for 2 months. Then go to once daily with breakfast.  . PROAIR HFA 108 (90 Base) MCG/ACT inhaler Take 1 puff by mouth every 4 (four) hours as needed for shortness of breath.  . rosuvastatin (CRESTOR) 20 MG tablet Take 20 mg by mouth daily.  . sucralfate (CARAFATE) 1 g tablet Take 1 g by mouth 4 (four) times daily.  . [  DISCONTINUED] pantoprazole (PROTONIX) 40 MG tablet Take 1 tab with breakfast every morning. (Patient taking differently: Take 40 mg by mouth daily. )  . cholestyramine (QUESTRAN) 4 g packet Take 1 pack in juice or water once daily, in the am, 2 hours away from taking other medications.  . [DISCONTINUED] levofloxacin (LEVAQUIN) 750 MG tablet Take 750 mg by mouth daily.  . [DISCONTINUED] potassium chloride SA (K-DUR,KLOR-CON) 20 MEQ tablet Take 1 tablet (20 mEq total) by mouth daily. (Patient not taking:  Reported on 03/13/2018)  . [DISCONTINUED] ranitidine (ZANTAC) 150 MG capsule Take 150 mg by mouth daily as needed for heartburn.   . [DISCONTINUED] tiZANidine (ZANAFLEX) 2 MG tablet Take 2 mg by mouth every 8 (eight) hours as needed for muscle spasms.    Facility-Administered Encounter Medications as of 03/13/2018  Medication  . 0.9 %  sodium chloride infusion   Allergies  Allergen Reactions  . Sulfa Antibiotics Rash and Hives   Patient Active Problem List   Diagnosis Date Noted  . Dyspepsia 02/12/2012  . Degenerative disc disease 01/02/2012  . Chronic back pain 01/02/2012   Social History   Socioeconomic History  . Marital status: Legally Separated    Spouse name: Not on file  . Number of children: 2  . Years of education: Not on file  . Highest education level: Not on file  Occupational History  . Occupation: Armed forces operational officer: [employer]  Social Needs  . Financial resource strain: Not on file  . Food insecurity:    Worry: Not on file    Inability: Not on file  . Transportation needs:    Medical: Not on file    Non-medical: Not on file  Tobacco Use  . Smoking status: Current Every Day Smoker    Packs/day: 1.00    Types: Cigarettes  . Smokeless tobacco: Never Used  Substance and Sexual Activity  . Alcohol use: Yes    Comment: 1-2 daily  . Drug use: No  . Sexual activity: Yes    Birth control/protection: Surgical  Lifestyle  . Physical activity:    Days per week: Not on file    Minutes per session: Not on file  . Stress: Not on file  Relationships  . Social connections:    Talks on phone: Not on file    Gets together: Not on file    Attends religious service: Not on file    Active member of club or organization: Not on file    Attends meetings of clubs or organizations: Not on file    Relationship status: Not on file  . Intimate partner violence:    Fear of current or ex partner: Not on file    Emotionally abused: Not on file     Physically abused: Not on file    Forced sexual activity: Not on file  Other Topics Concern  . Not on file  Social History Narrative  . Not on file    Valerie Key's family history includes Colon cancer in her paternal uncle; Diabetes in her father; Heart disease in her maternal grandfather and paternal grandfather.      Objective:    Vitals:   03/13/18 0842  BP: (!) 148/80  Pulse: (!) 105    Physical Exam; well-developed white female in no acute distress, pleasant pressure 148/80 pulse 100, BMI 25.3.  HEENT; nontraumatic normocephalic EOMI PERRLA sclera anicteric oral mucosa moist, Cardiovascular; regular rate and rhythm with S1-S2 no murmur rub or gallop, Pulmonary;  clear bilaterally, Abdomen ;soft, nontender nondistended bowel sounds are active there is no palpable mass or hepatosplenomegaly, Rectal ;exam no external lesions noted, on digital exam stool is darker brown and Hemoccult positive.  Extremities; no clubbing cyanosis or edema skin warm and dry Neuro psych; alert and oriented, grossly nonfocal mood and affect appropriate       Assessment & Plan:   #63 57 year old female with 54-month history of dark stools concerning for blood. Patient is status post cholecystectomy in February 2019, and says ever since then her stools have been darker greenish in color but then got progressively darker over the past couple of months.  Recent hemoglobin was normal, she is heme positive today. Cannot rule out intermittent low-grade GI bleeding She had recent colonoscopy and EGD January 2019 no gastritis or duodenitis.  Rule out gastritis, rule out peptic ulcer disease,  2 postcholecystectomy bile salt induced urgency and diarrhea #3 colon cancer screening-negative colonoscopy January 2019 1 polyp removed which was an inflammatory polyp-follow-up 10 years For diverticulosis #5 COPD 6.  Chronic pain syndrome 7.  Fatty liver-probably nonalcoholic fatty liver disease, otitis serologies,  autoimmune markers and inheritable markers all negative or normal.  Question of early cirrhosis on CT December 2018  Plan; Increase Protonix to 40 mg p.o. twice daily x2 months then back to once daily every morning Stop Carafate  CBc Patient will be scheduled for upper endoscopy with Dr. Hilarie Fredrickson.  Procedure was discussed in detail with the patient including indications risks and benefits and she is agreeable to proceed. Will start Questran 4 g once daily least 2 hours away from other meds. Regarding liver-she should have ultrasound with elastography.-This can be scheduled after she undergoes EGD and current issue with dark stool/heme positive stool is sorted out.  Amy S Esterwood PA-C 03/13/2018   Cc: No ref. provider found   Addendum: Reviewed and agree with assessment and management plan. Pyrtle, Lajuan Lines, MD

## 2018-04-14 ENCOUNTER — Encounter: Payer: Self-pay | Admitting: Internal Medicine

## 2018-04-14 ENCOUNTER — Other Ambulatory Visit: Payer: Self-pay

## 2018-04-14 ENCOUNTER — Telehealth: Payer: Self-pay | Admitting: *Deleted

## 2018-04-14 ENCOUNTER — Other Ambulatory Visit: Payer: Commercial Managed Care - PPO

## 2018-04-14 ENCOUNTER — Ambulatory Visit (AMBULATORY_SURGERY_CENTER): Payer: Commercial Managed Care - PPO | Admitting: Internal Medicine

## 2018-04-14 VITALS — BP 116/66 | HR 91 | Temp 95.0°F | Resp 18 | Ht 64.0 in | Wt 147.0 lb

## 2018-04-14 DIAGNOSIS — K921 Melena: Secondary | ICD-10-CM | POA: Diagnosis not present

## 2018-04-14 DIAGNOSIS — K449 Diaphragmatic hernia without obstruction or gangrene: Secondary | ICD-10-CM | POA: Diagnosis not present

## 2018-04-14 DIAGNOSIS — K760 Fatty (change of) liver, not elsewhere classified: Secondary | ICD-10-CM

## 2018-04-14 DIAGNOSIS — R195 Other fecal abnormalities: Secondary | ICD-10-CM

## 2018-04-14 MED ORDER — SODIUM CHLORIDE 0.9 % IV SOLN
500.0000 mL | Freq: Once | INTRAVENOUS | Status: DC
Start: 2018-04-14 — End: 2018-04-14

## 2018-04-14 NOTE — Patient Instructions (Signed)
Continue present medications. Stop Carafate. Office follow-up next available. Please read handout on hiatal hernia. Fecal occult blood testing.     YOU HAD AN ENDOSCOPIC PROCEDURE TODAY AT [facility]:   Refer to the procedure report that was given to you for any specific questions about what was found during the examination.  If the procedure report does not answer your questions, please call your gastroenterologist to clarify.  If you requested that your care partner not be given the details of your procedure findings, then the procedure report has been included in a sealed envelope for you to review at your convenience later.  YOU SHOULD EXPECT: Some feelings of bloating in the abdomen. Passage of more gas than usual.  Walking can help get rid of the air that was put into your GI tract during the procedure and reduce the bloating. If you had a lower endoscopy (such as a colonoscopy or flexible sigmoidoscopy) you may notice spotting of blood in your stool or on the toilet paper. If you underwent a bowel prep for your procedure, you may not have a normal bowel movement for a few days.  Please Note:  You might notice some irritation and congestion in your nose or some drainage.  This is from the oxygen used during your procedure.  There is no need for concern and it should clear up in a day or so.  SYMPTOMS TO REPORT IMMEDIATELY:     Following upper endoscopy (EGD)  Vomiting of blood or coffee ground material  New chest pain or pain under the shoulder blades  Painful or persistently difficult swallowing  New shortness of breath  Fever of 100F or higher  Black, tarry-looking stools  For urgent or emergent issues, a gastroenterologist can be reached at any hour by calling [phone].   DIET:  We do recommend a small meal at first, but then you may proceed to your regular diet.  Drink plenty of fluids but you should avoid alcoholic beverages for 24  hours.  ACTIVITY:  You should plan to take it easy for the rest of today and you should NOT DRIVE or use heavy machinery until tomorrow (because of the sedation medicines used during the test).    FOLLOW UP: Our staff will call the number listed on your records the next business day following your procedure to check on you and address any questions or concerns that you may have regarding the information given to you following your procedure. If we do not reach you, we will leave a message.  However, if you are feeling well and you are not experiencing any problems, there is no need to return our call.  We will assume that you have returned to your regular daily activities without incident.  If any biopsies were taken you will be contacted by phone or by letter within the next 1-3 weeks.  Please call us at [phone] if you have not heard about the biopsies in 3 weeks.    SIGNATURES/CONFIDENTIALITY: You and/or your care partner have signed paperwork which will be entered into your electronic medical record.  These signatures attest to the fact that that the information above on your After Visit Summary has been reviewed and is understood.  Full responsibility of the confidentiality of this discharge information lies with you and/or your care-partner.

## 2018-04-14 NOTE — Progress Notes (Signed)
A/ox3 pleased with MAC, report to RN 

## 2018-04-14 NOTE — Op Note (Signed)
[facility] Patient Name: Valerie Key Procedure Date: 04/14/2018 2:06 PM MRN: [id number] Endoscopist: Jerene Bears , MD Age: 57 Referring MD:  Date of Birth: 04/26/1961 Gender: Female Account #: [id number] Procedure:                Upper GI endoscopy Indications:              Dark, black, stools, concern for GI bleeding Medicines:                Monitored Anesthesia Care Procedure:                Pre-Anesthesia Assessment:                           - Prior to the procedure, a History and Physical                            was performed, and patient medications and                            allergies were reviewed. The patient's tolerance of                            previous anesthesia was also reviewed. The risks                            and benefits of the procedure and the sedation                            options and risks were discussed with the patient.                            All questions were answered, and informed consent                            was obtained. Prior Anticoagulants: The patient has                            taken no previous anticoagulant or antiplatelet                            agents. ASA Grade Assessment: II - A patient with                            mild systemic disease. After reviewing the risks                            and benefits, the patient was deemed in                            satisfactory condition to undergo the procedure.                           After obtaining informed consent, the endoscope was  passed under direct vision. Throughout the                            procedure, the patient's blood pressure, pulse, and                            oxygen saturations were monitored continuously. The                            Endoscope was introduced through the mouth, and                            advanced to the second part of duodenum. The upper                            GI endoscopy  was accomplished without difficulty.                            The patient tolerated the procedure well. Scope In: Scope Out: Findings:                 The examined esophagus was normal.                           A 2 cm hiatal hernia was present.                           The entire examined stomach was normal.                           The examined duodenum was normal. Complications:            No immediate complications. Estimated Blood Loss:     Estimated blood loss: none. Impression:               - Normal esophagus.                           - 2 cm hiatal hernia.                           - Normal stomach.                           - Normal examined duodenum.                           - No specimens collected. Recommendation:           - Patient has a contact number available for                            emergencies. The signs and symptoms of potential                            delayed complications were discussed with the  patient. Return to normal activities tomorrow.                            Written discharge instructions were provided to the                            patient.                           - Resume previous diet.                           - Continue present medications. Can stop Carafate.                           - Check fecal occult blood testing before                            proceeding with any additional work-up for                            dark/black stools. If heme positive would recommend                            video capsule endoscopy.                           - Ultrasound with elastography to be scheduled.                           - Office follow-up next available. Jerene Bears, MD 04/14/2018 2:36:22 PM This report has been signed electronically.

## 2018-04-14 NOTE — Telephone Encounter (Signed)
Called patient and explained to her that the stool cards that were sent home with her are not the ones that we currently are using.  I asked if she would be able to come to the lab and pick up the kit for fecal occult blood.  She will come by this week.

## 2018-04-15 ENCOUNTER — Other Ambulatory Visit: Payer: Self-pay

## 2018-04-15 ENCOUNTER — Telehealth: Payer: Self-pay

## 2018-04-15 ENCOUNTER — Telehealth: Payer: Self-pay | Admitting: *Deleted

## 2018-04-15 DIAGNOSIS — K760 Fatty (change of) liver, not elsewhere classified: Secondary | ICD-10-CM

## 2018-04-15 NOTE — Telephone Encounter (Signed)
Pt aware of appts and prep instructions.

## 2018-04-15 NOTE — Telephone Encounter (Signed)
Pt scheduled for [place] abd with elastography at [facility] 04/23/18@9am , pt to arrive there at 8:45am. Pt to be NPO after midnight. Pt scheduled for OV with Dr. Hilarie Fredrickson 05/29/18@10am . Left message for pt to call back.

## 2018-04-15 NOTE — Telephone Encounter (Signed)
  Follow up Call-  Call back number 04/14/2018 07/04/2017  Post procedure Call Back phone  # [phone]  Permission to leave phone message No No  comments - NO VOICEMAIL  Some recent data might be hidden     Patient questions:  Do you have a fever, pain , or abdominal swelling? No. Pain Score  0 *  Have you tolerated food without any problems? Yes.    Have you been able to return to your normal activities? Yes.    Do you have any questions about your discharge instructions: Diet   No. Medications  No. Follow up visit  No.  Do you have questions or concerns about your Care? No.  Actions: * If pain score is 4 or above: No action needed, pain <4.

## 2018-04-18 ENCOUNTER — Other Ambulatory Visit: Payer: Commercial Managed Care - PPO

## 2018-04-23 ENCOUNTER — Ambulatory Visit (HOSPITAL_COMMUNITY)
Admission: RE | Admit: 2018-04-23 | Discharge: 2018-04-23 | Disposition: A | Discharge status: Home / Self Care | Payer: Commercial Managed Care - PPO | Source: Ambulatory Visit | Attending: Internal Medicine | Admitting: Internal Medicine

## 2018-04-23 DIAGNOSIS — R932 Abnormal findings on diagnostic imaging of liver and biliary tract: Secondary | ICD-10-CM | POA: Diagnosis not present

## 2018-04-23 DIAGNOSIS — K760 Fatty (change of) liver, not elsewhere classified: Secondary | ICD-10-CM | POA: Diagnosis not present

## 2018-04-24 ENCOUNTER — Other Ambulatory Visit (INDEPENDENT_AMBULATORY_CARE_PROVIDER_SITE_OTHER): Payer: Commercial Managed Care - PPO

## 2018-04-24 DIAGNOSIS — R195 Other fecal abnormalities: Secondary | ICD-10-CM | POA: Diagnosis not present

## 2018-04-24 LAB — FECAL OCCULT BLOOD, IMMUNOCHEMICAL: FECAL OCCULT BLD: NEGATIVE

## 2018-05-21 ENCOUNTER — Encounter: Payer: Self-pay | Admitting: *Deleted

## 2018-05-21 ENCOUNTER — Other Ambulatory Visit: Payer: Self-pay | Admitting: Physician Assistant

## 2018-05-29 ENCOUNTER — Ambulatory Visit: Payer: Commercial Managed Care - PPO | Admitting: Internal Medicine

## 2018-06-28 ENCOUNTER — Emergency Department (HOSPITAL_COMMUNITY): Payer: Commercial Managed Care - PPO

## 2018-06-28 ENCOUNTER — Inpatient Hospital Stay (HOSPITAL_COMMUNITY)
Admission: EM | Admit: 2018-06-28 | Discharge: 2018-07-02 | DRG: 190 | Disposition: A | Discharge status: Home / Self Care | Payer: Commercial Managed Care - PPO | Attending: Internal Medicine | Admitting: Internal Medicine

## 2018-06-28 ENCOUNTER — Other Ambulatory Visit: Payer: Self-pay

## 2018-06-28 ENCOUNTER — Encounter (HOSPITAL_COMMUNITY): Payer: Self-pay | Admitting: Emergency Medicine

## 2018-06-28 DIAGNOSIS — F32A Depression, unspecified: Secondary | ICD-10-CM | POA: Diagnosis present

## 2018-06-28 DIAGNOSIS — F419 Anxiety disorder, unspecified: Secondary | ICD-10-CM | POA: Diagnosis present

## 2018-06-28 DIAGNOSIS — M549 Dorsalgia, unspecified: Secondary | ICD-10-CM | POA: Diagnosis not present

## 2018-06-28 DIAGNOSIS — Z8249 Family history of ischemic heart disease and other diseases of the circulatory system: Secondary | ICD-10-CM

## 2018-06-28 DIAGNOSIS — G8929 Other chronic pain: Secondary | ICD-10-CM

## 2018-06-28 DIAGNOSIS — E785 Hyperlipidemia, unspecified: Secondary | ICD-10-CM | POA: Diagnosis present

## 2018-06-28 DIAGNOSIS — Z720 Tobacco use: Secondary | ICD-10-CM | POA: Diagnosis not present

## 2018-06-28 DIAGNOSIS — Z833 Family history of diabetes mellitus: Secondary | ICD-10-CM

## 2018-06-28 DIAGNOSIS — J441 Chronic obstructive pulmonary disease with (acute) exacerbation: Secondary | ICD-10-CM | POA: Diagnosis not present

## 2018-06-28 DIAGNOSIS — I10 Essential (primary) hypertension: Secondary | ICD-10-CM | POA: Diagnosis present

## 2018-06-28 DIAGNOSIS — F17210 Nicotine dependence, cigarettes, uncomplicated: Secondary | ICD-10-CM | POA: Diagnosis present

## 2018-06-28 DIAGNOSIS — Z716 Tobacco abuse counseling: Secondary | ICD-10-CM

## 2018-06-28 DIAGNOSIS — R8271 Bacteriuria: Secondary | ICD-10-CM | POA: Diagnosis present

## 2018-06-28 DIAGNOSIS — K449 Diaphragmatic hernia without obstruction or gangrene: Secondary | ICD-10-CM | POA: Diagnosis present

## 2018-06-28 DIAGNOSIS — J302 Other seasonal allergic rhinitis: Secondary | ICD-10-CM | POA: Diagnosis present

## 2018-06-28 DIAGNOSIS — F329 Major depressive disorder, single episode, unspecified: Secondary | ICD-10-CM | POA: Diagnosis present

## 2018-06-28 DIAGNOSIS — Z882 Allergy status to sulfonamides status: Secondary | ICD-10-CM

## 2018-06-28 DIAGNOSIS — Z90710 Acquired absence of both cervix and uterus: Secondary | ICD-10-CM

## 2018-06-28 DIAGNOSIS — K760 Fatty (change of) liver, not elsewhere classified: Secondary | ICD-10-CM | POA: Diagnosis present

## 2018-06-28 DIAGNOSIS — K219 Gastro-esophageal reflux disease without esophagitis: Secondary | ICD-10-CM | POA: Diagnosis present

## 2018-06-28 DIAGNOSIS — G894 Chronic pain syndrome: Secondary | ICD-10-CM | POA: Diagnosis present

## 2018-06-28 DIAGNOSIS — Z9049 Acquired absence of other specified parts of digestive tract: Secondary | ICD-10-CM

## 2018-06-28 DIAGNOSIS — Z79899 Other long term (current) drug therapy: Secondary | ICD-10-CM

## 2018-06-28 DIAGNOSIS — J9601 Acute respiratory failure with hypoxia: Secondary | ICD-10-CM | POA: Diagnosis present

## 2018-06-28 LAB — COMPREHENSIVE METABOLIC PANEL
ALT: 18 U/L (ref 0–44)
AST: 26 U/L (ref 15–41)
Albumin: 4.3 g/dL (ref 3.5–5.0)
Alkaline Phosphatase: 77 U/L (ref 38–126)
Anion gap: 9 (ref 5–15)
BUN: 10 mg/dL (ref 6–20)
CHLORIDE: 103 mmol/L (ref 98–111)
CO2: 27 mmol/L (ref 22–32)
CREATININE: 0.75 mg/dL (ref 0.44–1.00)
Calcium: 8.6 mg/dL — ABNORMAL LOW (ref 8.9–10.3)
GFR calc Af Amer: >60 mL/min (ref >60)
GFR calc non Af Amer: >60 mL/min (ref >60)
Glucose, Bld: 92 mg/dL (ref 70–99)
Potassium: 3.7 mmol/L (ref 3.5–5.1)
Sodium: 139 mmol/L (ref 135–145)
Total Bilirubin: 0.4 mg/dL (ref 0.3–1.2)
Total Protein: 7.4 g/dL (ref 6.5–8.1)

## 2018-06-28 LAB — URINALYSIS, ROUTINE W REFLEX MICROSCOPIC
Bilirubin Urine: NEGATIVE
GLUCOSE, UA: NEGATIVE mg/dL
Ketones, ur: NEGATIVE mg/dL
Leukocytes, UA: NEGATIVE
NITRITE: POSITIVE — AB
Protein, ur: NEGATIVE mg/dL
Specific Gravity, Urine: 1.014 (ref 1.005–1.030)
pH: 5 (ref 5.0–8.0)

## 2018-06-28 LAB — CBC WITH DIFFERENTIAL/PLATELET
Abs Immature Granulocytes: 0.03 10*3/uL (ref 0.00–0.07)
Basophils Absolute: 0 10*3/uL (ref 0.0–0.1)
Basophils Relative: 0 %
EOS PCT: 1 %
Eosinophils Absolute: 0.1 10*3/uL (ref 0.0–0.5)
HCT: 42.2 % (ref 36.0–46.0)
Hemoglobin: 13.2 g/dL (ref 12.0–15.0)
Immature Granulocytes: 0 %
Lymphocytes Relative: 17 %
Lymphs Abs: 1.4 10*3/uL (ref 0.7–4.0)
MCH: 30.2 pg (ref 26.0–34.0)
MCHC: 31.3 g/dL (ref 30.0–36.0)
MCV: 96.6 fL (ref 80.0–100.0)
MONO ABS: 1 10*3/uL (ref 0.1–1.0)
Monocytes Relative: 12 %
Neutro Abs: 5.9 10*3/uL (ref 1.7–7.7)
Neutrophils Relative %: 70 %
Platelets: 190 10*3/uL (ref 150–400)
RBC: 4.37 MIL/uL (ref 3.87–5.11)
RDW: 13 % (ref 11.5–15.5)
WBC: 8.4 10*3/uL (ref 4.0–10.5)
nRBC: 0 % (ref 0.0–0.2)

## 2018-06-28 LAB — INFLUENZA PANEL BY PCR (TYPE A & B)
Influenza A By PCR: NEGATIVE
Influenza B By PCR: NEGATIVE

## 2018-06-28 LAB — LACTIC ACID, PLASMA: Lactic Acid, Venous: 1.2 mmol/L (ref 0.5–1.9)

## 2018-06-28 MED ORDER — ACETAMINOPHEN 325 MG PO TABS
650.0000 mg | ORAL_TABLET | Freq: Four times a day (QID) | ORAL | Status: DC | PRN
  Administered 2018-06-29 (×2): 650 mg via ORAL
  Filled 2018-06-28 (×2): qty 2

## 2018-06-28 MED ORDER — NICOTINE 21 MG/24HR TD PT24
21.0000 mg | MEDICATED_PATCH | Freq: Every day | TRANSDERMAL | Status: DC
  Administered 2018-06-28 – 2018-07-02 (×5): 21 mg via TRANSDERMAL
  Filled 2018-06-28 (×5): qty 1

## 2018-06-28 MED ORDER — SODIUM CHLORIDE 0.9 % IV BOLUS
1000.0000 mL | Freq: Once | INTRAVENOUS | Status: AC
  Administered 2018-06-28: 1000 mL via INTRAVENOUS

## 2018-06-28 MED ORDER — PANTOPRAZOLE SODIUM 40 MG PO TBEC
40.0000 mg | DELAYED_RELEASE_TABLET | Freq: Every day | ORAL | Status: DC
  Administered 2018-06-29 – 2018-07-02 (×4): 40 mg via ORAL
  Filled 2018-06-28 (×4): qty 1

## 2018-06-28 MED ORDER — SODIUM CHLORIDE 0.9% FLUSH
3.0000 mL | Freq: Two times a day (BID) | INTRAVENOUS | Status: DC
  Administered 2018-06-28 – 2018-07-02 (×5): 3 mL via INTRAVENOUS

## 2018-06-28 MED ORDER — ALBUTEROL SULFATE (2.5 MG/3ML) 0.083% IN NEBU: 2.5000 mg | INHALATION_SOLUTION | RESPIRATORY_TRACT | Status: DC | PRN

## 2018-06-28 MED ORDER — SODIUM CHLORIDE 0.9% FLUSH
3.0000 mL | INTRAVENOUS | Status: DC | PRN
  Administered 2018-07-01 (×2): 3 mL via INTRAVENOUS
  Filled 2018-06-28 (×2): qty 3

## 2018-06-28 MED ORDER — OXYCODONE-ACETAMINOPHEN 5-325 MG PO TABS
1.0000 | ORAL_TABLET | Freq: Four times a day (QID) | ORAL | Status: DC | PRN
  Administered 2018-06-28 – 2018-07-02 (×12): 1 via ORAL
  Filled 2018-06-28 (×13): qty 1

## 2018-06-28 MED ORDER — METHYLPREDNISOLONE SODIUM SUCC 125 MG IJ SOLR
125.0000 mg | Freq: Once | INTRAMUSCULAR | Status: AC
  Administered 2018-06-28: 125 mg via INTRAVENOUS
  Filled 2018-06-28: qty 2

## 2018-06-28 MED ORDER — ACETAMINOPHEN 650 MG RE SUPP: 650.0000 mg | Freq: Four times a day (QID) | RECTAL | Status: DC | PRN

## 2018-06-28 MED ORDER — PREDNISONE 20 MG PO TABS
40.0000 mg | ORAL_TABLET | Freq: Every day | ORAL | Status: DC
  Administered 2018-06-29 – 2018-06-30 (×2): 40 mg via ORAL
  Filled 2018-06-28 (×2): qty 2

## 2018-06-28 MED ORDER — SODIUM CHLORIDE 0.9 % IV SOLN: 250.0000 mL | INTRAVENOUS | Status: DC | PRN

## 2018-06-28 MED ORDER — LISINOPRIL 10 MG PO TABS
10.0000 mg | ORAL_TABLET | Freq: Every day | ORAL | Status: DC
  Administered 2018-06-29 – 2018-07-02 (×4): 10 mg via ORAL
  Filled 2018-06-28 (×4): qty 1

## 2018-06-28 MED ORDER — ONDANSETRON HCL 4 MG PO TABS: 4.0000 mg | ORAL_TABLET | Freq: Four times a day (QID) | ORAL | Status: DC | PRN

## 2018-06-28 MED ORDER — IPRATROPIUM BROMIDE 0.02 % IN SOLN
1.0000 mg | Freq: Once | RESPIRATORY_TRACT | Status: AC
  Administered 2018-06-28: 1 mg via RESPIRATORY_TRACT
  Filled 2018-06-28: qty 5

## 2018-06-28 MED ORDER — LORATADINE 10 MG PO TABS
10.0000 mg | ORAL_TABLET | Freq: Every day | ORAL | Status: DC
  Administered 2018-06-29 – 2018-07-02 (×4): 10 mg via ORAL
  Filled 2018-06-28 (×4): qty 1

## 2018-06-28 MED ORDER — ALBUTEROL (5 MG/ML) CONTINUOUS INHALATION SOLN
10.0000 mg/h | INHALATION_SOLUTION | Freq: Once | RESPIRATORY_TRACT | Status: AC
  Administered 2018-06-28: 10 mg/h via RESPIRATORY_TRACT
  Filled 2018-06-28: qty 20

## 2018-06-28 MED ORDER — ENOXAPARIN SODIUM 40 MG/0.4ML ~~LOC~~ SOLN
40.0000 mg | SUBCUTANEOUS | Status: DC
  Administered 2018-06-28 – 2018-07-01 (×4): 40 mg via SUBCUTANEOUS
  Filled 2018-06-28 (×4): qty 0.4

## 2018-06-28 MED ORDER — ALPRAZOLAM 0.25 MG PO TABS: 0.2500 mg | ORAL_TABLET | Freq: Every day | ORAL | Status: DC | PRN

## 2018-06-28 MED ORDER — ACETAMINOPHEN 500 MG PO TABS
1000.0000 mg | ORAL_TABLET | Freq: Once | ORAL | Status: AC
  Administered 2018-06-28: 1000 mg via ORAL
  Filled 2018-06-28: qty 2

## 2018-06-28 MED ORDER — ONDANSETRON HCL 4 MG/2ML IJ SOLN: 4.0000 mg | Freq: Four times a day (QID) | INTRAMUSCULAR | Status: DC | PRN

## 2018-06-28 MED ORDER — IPRATROPIUM-ALBUTEROL 0.5-2.5 (3) MG/3ML IN SOLN
3.0000 mL | Freq: Three times a day (TID) | RESPIRATORY_TRACT | Status: DC
  Administered 2018-06-28 – 2018-06-30 (×5): 3 mL via RESPIRATORY_TRACT
  Filled 2018-06-28 (×5): qty 3

## 2018-06-28 MED ORDER — SODIUM CHLORIDE 0.9 % IV SOLN
Freq: Once | INTRAVENOUS | Status: AC
  Administered 2018-06-28: 12:00:00 via INTRAVENOUS

## 2018-06-28 MED ORDER — CITALOPRAM HYDROBROMIDE 20 MG PO TABS
20.0000 mg | ORAL_TABLET | Freq: Every day | ORAL | Status: DC
  Administered 2018-06-28 – 2018-07-01 (×4): 20 mg via ORAL
  Filled 2018-06-28 (×4): qty 1

## 2018-06-28 MED ORDER — ROSUVASTATIN CALCIUM 10 MG PO TABS
10.0000 mg | ORAL_TABLET | Freq: Every evening | ORAL | Status: DC
  Administered 2018-06-28 – 2018-07-01 (×4): 10 mg via ORAL
  Filled 2018-06-28 (×4): qty 1

## 2018-06-28 MED ORDER — METHYLPREDNISOLONE SODIUM SUCC 40 MG IJ SOLR
40.0000 mg | Freq: Three times a day (TID) | INTRAMUSCULAR | Status: AC
  Administered 2018-06-28 (×2): 40 mg via INTRAVENOUS
  Filled 2018-06-28 (×2): qty 1

## 2018-06-28 NOTE — ED Triage Notes (Signed)
Patient here from home with complaints of SOB that started last night. Reports that she does not wear O2 at home. Flu like symptoms. Cough. Smoker. Body aches.

## 2018-06-28 NOTE — ED Provider Notes (Signed)
[facility] DEPT Provider Note   CSN: [id number] Arrival date & time: 06/28/18  1001     History   Chief Complaint Chief Complaint  Patient presents with  . Shortness of Breath  . Cough    HPI Valerie Key is a 58 y.o. female.  HPI   58 year old female with past medical history of COPD here with cough, subjective fever, wheezing.  The patient states that she has at least 10 people at work that have had recently diagnosed influenza-like illness.  She states that her symptoms started approximately 2 days ago.  She began to develop a mild cough and has since developed subjective fevers, chills, cough, and generalized weakness.  She is began wheezing and has had significantly increased shortness of breath over the last 24 hours.  She does have a history of smoking and uses an inhaler, and has been using it more frequently, without significant relief.  She has had temperatures up to 100.0 Fahrenheit at home.  She has had normal appetite.  No leg swelling.  No rashes.  No abdominal pain, nausea, vomiting, or diarrhea.  She is unsure what her colleagues at work were diagnosed with.  No recent travel to [place] or [place].  No recent international travel.  Past Medical History:  Diagnosis Date  . Allergy    SEASONAL  . Anxiety   . Arthritis   . Asthma   . Back pain   . Chronic pain syndrome   . COPD (chronic obstructive pulmonary disease) ([place])   . Depression   . Diverticulosis   . Fatty liver   . Gastritis   . GERD (gastroesophageal reflux disease)   . Hiatal hernia   . History of kidney stones   . Hyperlipidemia     Patient Active Problem List   Diagnosis Date Noted  . Acute respiratory failure with hypoxia ([place]) 06/28/2018  . Anxiety 06/28/2018  . COPD with acute exacerbation ([place]) 06/28/2018  . Depression 06/28/2018  . GERD (gastroesophageal reflux disease) 06/28/2018  . Hyperlipidemia 06/28/2018  . Tobacco use 06/28/2018  .  Dyspepsia 02/12/2012  . Degenerative disc disease 01/02/2012  . Chronic back pain 01/02/2012    Past Surgical History:  Procedure Laterality Date  . ABDOMINAL HYSTERECTOMY     with bladder tact  . BLADDER TACK    . CHOLECYSTECTOMY N/A 07/26/2017   Procedure: LAPAROSCOPIC CHOLECYSTECTOMY;  Surgeon: Clovis Riley, MD;  Location: [facility];  Service: General;  Laterality: N/A;     OB History   No obstetric history on file.      Home Medications    Prior to Admission medications   Medication Sig Start Date End Date Taking? Authorizing Provider  ALPRAZolam Duanne Moron) 0.5 MG tablet Take 0.25 mg by mouth daily as needed for anxiety.    Yes [provider]  citalopram (CELEXA) 20 MG tablet Take 20 mg by mouth at bedtime.  05/16/17  Yes [provider]  lisinopril (PRINIVIL,ZESTRIL) 10 MG tablet Take 10 mg by mouth daily.   Yes [provider]  loratadine (CLARITIN) 10 MG tablet Take 10 mg by mouth daily.   Yes [provider]  oxyCODONE-acetaminophen (PERCOCET/ROXICET) 5-325 MG tablet Take 1 tablet by mouth every 6 (six) hours as needed for severe pain. 07/26/17  Yes Clovis Riley, MD  pantoprazole (PROTONIX) 40 MG tablet TAKE 1 TAB WITH BREAKFAST EVERY MORNING. Patient taking differently: Take 40 mg by mouth daily.  05/21/18  Yes [place], [place]  S, PA-C  PROAIR HFA 108 (90 Base) MCG/ACT inhaler Take 1 puff by mouth every 4 (four) hours as needed for shortness of breath. 06/28/17  Yes [provider]  rosuvastatin (CRESTOR) 10 MG tablet Take 10 mg by mouth every evening.   Yes [provider]  cholestyramine (QUESTRAN) 4 g packet Take 1 pack in juice or water once daily, in the am, 2 hours away from taking other medications. Patient not taking: Reported on 06/28/2018 03/13/18   Alfredia Ferguson, PA-C    Family History Family History  Problem Relation Age of Onset  . Diabetes Father   . Colon cancer Paternal Uncle        x 2  .  Heart disease Maternal Grandfather   . Heart disease Paternal Grandfather     Social History Social History   Tobacco Use  . Smoking status: Current Every Day Smoker    Packs/day: 1.00    Types: Cigarettes  . Smokeless tobacco: Never Used  Substance Use Topics  . Alcohol use: Yes    Comment: 1-2 daily  . Drug use: No     Allergies   Sulfa antibiotics   Review of Systems Review of Systems  Constitutional: Positive for chills, fatigue and fever.  HENT: Negative for congestion and rhinorrhea.   Eyes: Negative for visual disturbance.  Respiratory: Positive for cough and wheezing. Negative for shortness of breath.   Cardiovascular: Negative for chest pain and leg swelling.  Gastrointestinal: Negative for abdominal pain, diarrhea, nausea and vomiting.  Genitourinary: Negative for dysuria and flank pain.  Musculoskeletal: Negative for neck pain and neck stiffness.  Skin: Negative for rash and wound.  Allergic/Immunologic: Negative for immunocompromised state.  Neurological: Positive for weakness. Negative for syncope and headaches.  All other systems reviewed and are negative.    Physical Exam Updated Vital Signs BP 131/68 (BP Location: Left Arm)   Pulse (!) 108 Comment: at rest  Temp 98.5 F (36.9 C) (Oral)   Resp 20   Ht 5\' 4"  (1.626 m)   Wt 63.5 kg   SpO2 93%   BMI 24.03 kg/m   Physical Exam Vitals signs and nursing note reviewed.  Constitutional:      General: She is not in acute distress.    Appearance: She is well-developed.  HENT:     Head: Normocephalic and atraumatic.     Mouth/Throat:     Pharynx: Posterior oropharyngeal erythema present.  Eyes:     Conjunctiva/sclera: Conjunctivae normal.  Neck:     Musculoskeletal: Neck supple.  Cardiovascular:     Rate and Rhythm: Regular rhythm. Tachycardia present.     Heart sounds: Normal heart sounds. No murmur. No friction rub.  Pulmonary:     Effort: Tachypnea and respiratory distress present.      Breath sounds: Examination of the right-upper field reveals wheezing. Examination of the left-upper field reveals wheezing. Examination of the right-middle field reveals wheezing. Examination of the left-middle field reveals wheezing. Examination of the right-lower field reveals wheezing. Examination of the left-lower field reveals wheezing. Wheezing present. No rales.  Abdominal:     General: There is no distension.     Palpations: Abdomen is soft.     Tenderness: There is no abdominal tenderness.  Skin:    General: Skin is warm.     Capillary Refill: Capillary refill takes less than 2 seconds.  Neurological:     Mental Status: She is alert and oriented to person, place, and time.  Motor: No abnormal muscle tone.      ED Treatments / Results  Labs (all labs ordered are listed, but only abnormal results are displayed) Labs Reviewed  COMPREHENSIVE METABOLIC PANEL - Abnormal; Notable for the following components:      Result Value   Calcium 8.6 (*)    All other components within normal limits  URINALYSIS, ROUTINE W REFLEX MICROSCOPIC - Abnormal; Notable for the following components:   APPearance HAZY (*)    Hgb urine dipstick MODERATE (*)    Nitrite POSITIVE (*)    Bacteria, UA FEW (*)    All other components within normal limits  LACTIC ACID, PLASMA  CBC WITH DIFFERENTIAL/PLATELET  INFLUENZA PANEL BY PCR (TYPE A & B)  HIV ANTIBODY (ROUTINE TESTING W REFLEX)    EKG EKG Interpretation  Date/Time:  Saturday June 28 2018 10:45:18 EST Ventricular Rate:  126 PR Interval:    QRS Duration: 92 QT Interval:  338 QTC Calculation: 490 R Axis:   85 Text Interpretation:  Sinus tachycardia Consider right atrial enlargement Borderline prolonged QT interval Baseline wander in lead(s) III aVL aVF V2 V4 Since last EKG, rate is increased Confirmed by Duffy Bruce [phone]) on 06/28/2018 11:21:27 AM Also confirmed by Duffy Bruce [phone]), editor Radene Gunning [phone])  on 06/28/2018  11:47:18 AM   Radiology Dg Chest 2 View  Result Date: 06/28/2018 CLINICAL DATA:  Nonproductive cough and shortness of breath beginning yesterday. Asthma. COPD. Smoker. EXAM: CHEST - 2 VIEW COMPARISON:  None. FINDINGS: The heart size and mediastinal contours are within normal limits. Aortic atherosclerosis. Mild pulmonary hyperinflation is consistent with COPD. Both lungs are clear. The visualized skeletal structures are unremarkable. IMPRESSION: COPD. No active cardiopulmonary disease. Electronically Signed   By: Earle Gell M.D.   On: 06/28/2018 11:17    Procedures .Critical Care Performed by: Duffy Bruce, MD Authorized by: Duffy Bruce, MD   Critical care provider statement:    Critical care time (minutes):  35   Critical care time was exclusive of:  Separately billable procedures and treating other patients and teaching time   Critical care was necessary to treat or prevent imminent or life-threatening deterioration of the following conditions:  Cardiac failure, circulatory failure and respiratory failure   Critical care was time spent personally by me on the following activities:  Development of treatment plan with patient or surrogate, discussions with consultants, evaluation of patient's response to treatment, examination of patient, obtaining history from patient or surrogate, ordering and performing treatments and interventions, ordering and review of laboratory studies, ordering and review of radiographic studies, pulse oximetry, re-evaluation of patient's condition and review of old charts   I assumed direction of critical care for this patient from another provider in my specialty: no     (including critical care time)  Medications Ordered in ED Medications  oxyCODONE-acetaminophen (PERCOCET/ROXICET) 5-325 MG per tablet 1 tablet (1 tablet Oral Given 06/28/18 1614)  lisinopril (PRINIVIL,ZESTRIL) tablet 10 mg (has no administration in time range)  rosuvastatin (CRESTOR)  tablet 10 mg (10 mg Oral Given 06/28/18 1726)  ALPRAZolam (XANAX) tablet 0.25 mg (has no administration in time range)  citalopram (CELEXA) tablet 20 mg (has no administration in time range)  pantoprazole (PROTONIX) EC tablet 40 mg (has no administration in time range)  loratadine (CLARITIN) tablet 10 mg (has no administration in time range)  nicotine (NICODERM CQ - dosed in mg/24 hours) patch 21 mg (21 mg Transdermal Patch Applied 06/28/18 1728)  methylPREDNISolone sodium succinate (SOLU-MEDROL) 40  mg/mL injection 40 mg (40 mg Intravenous Given 06/28/18 1614)    Followed by  predniSONE (DELTASONE) tablet 40 mg (has no administration in time range)  ipratropium-albuterol (DUONEB) 0.5-2.5 (3) MG/3ML nebulizer solution 3 mL (3 mLs Nebulization Not Given 06/28/18 1600)  albuterol (PROVENTIL) (2.5 MG/3ML) 0.083% nebulizer solution 2.5 mg (has no administration in time range)  enoxaparin (LOVENOX) injection 40 mg (has no administration in time range)  sodium chloride flush (NS) 0.9 % injection 3 mL (has no administration in time range)  sodium chloride flush (NS) 0.9 % injection 3 mL (has no administration in time range)  0.9 %  sodium chloride infusion (has no administration in time range)  acetaminophen (TYLENOL) tablet 650 mg (has no administration in time range)    Or  acetaminophen (TYLENOL) suppository 650 mg (has no administration in time range)  ondansetron (ZOFRAN) tablet 4 mg (has no administration in time range)    Or  ondansetron (ZOFRAN) injection 4 mg (has no administration in time range)  0.9 %  sodium chloride infusion ( Intravenous Stopping Infusion hung by another clincian 06/28/18 1605)  sodium chloride 0.9 % bolus 1,000 mL (0 mLs Intravenous Stopped 06/28/18 1330)  acetaminophen (TYLENOL) tablet 1,000 mg (1,000 mg Oral Given 06/28/18 1230)  albuterol (PROVENTIL,VENTOLIN) solution continuous neb (10 mg/hr Nebulization Given 06/28/18 1155)  ipratropium (ATROVENT) nebulizer solution 1 mg  (1 mg Nebulization Given 06/28/18 1155)  methylPREDNISolone sodium succinate (SOLU-MEDROL) 125 mg/2 mL injection 125 mg (125 mg Intravenous Given 06/28/18 1228)     Initial Impression / Assessment and Plan / ED Course  I have reviewed the triage vital signs and the nursing notes.  Pertinent labs & imaging results that were available during my care of the patient were reviewed by me and considered in my medical decision making (see chart for details).     58 yo F here with cough, SOB, wheezing in setting of suspected viral URI. CXR without PNA. Pt hypoxic, tachypneic on arrival so continuous nebs started. Pt given fluids, breathing tx, and steroids with moderate improvement. She remains tachycardic and hypoxic, so will admit. No focal PNA on CXR, no sputum production, will hold on ABX.  Final Clinical Impressions(s) / ED Diagnoses   Final diagnoses:  COPD exacerbation ([place])  Acute respiratory failure with hypoxia [facility])    ED Discharge Orders    None       Duffy Bruce, MD 06/28/18 1956

## 2018-06-28 NOTE — H&P (Signed)
History and Physical   Valerie Key GGY:[id number] DOB: November 30, 1960 DOA: 06/28/2018  Referring MD/NP/PA: Dr. Ellender Hose, [place] PCP: Default, Provider, MD - MD at work Outpatient Specialists: GI, Dr. Hilarie Fredrickson  Patient coming from: Home  Chief Complaint: Shortness of breath, wheezing  HPI: Valerie Key is a 58 y.o. female with a history of ~40 pack years tobacco use, COPD, depression, anxiety, DJD with chronic pain, HTN, HLD, and GERD who presented with increasing dyspnea and wheezing. Symptoms began after a coworker returned from [place] with flu-like symptoms, and she notes 6 other coworkers have called in sick this week. Symptoms appeared gradually over the previous few days starting with dry cough, chills, subjective fevers but progressed over the past day with moderate-becoming-severe dyspnea at rest, fever to 100F, and associated wheezing. When home MDI only offered momentary improvement, she asked her friend to bring her to the ED.    ED Course: Afebrile with sinus tachycardia, hypoxic requiring 2L O2. CBC and BMP unremarkable, flu negative, UA dirty catch with nitrites without pyuria. CXR hyperinflated without infiltrate, ECG nonischemic. Continuous nebulizer therapy and IV steroids provided with improvement but ongoing hypoxia, so admission requested.  Review of Systems: +fever, chills, no weight loss, chest pain, palpitations, abd pain, N/V/D/C, dysuria, hematuria, GI bleeding, myalgias, +arthralgias acutely worse but similar character to chronic pain, and per HPI. All others reviewed and are negative.   Past Medical History:  Diagnosis Date  . Allergy    SEASONAL  . Anxiety   . Arthritis   . Asthma   . Back pain   . Chronic pain syndrome   . COPD (chronic obstructive pulmonary disease) ([place])   . Depression   . Diverticulosis   . Fatty liver   . Gastritis   . GERD (gastroesophageal reflux disease)   . Hiatal hernia   . History of kidney stones   .  Hyperlipidemia   . Renal disorder    Past Surgical History:  Procedure Laterality Date  . ABDOMINAL HYSTERECTOMY     with bladder tact  . BLADDER TACK    . CHOLECYSTECTOMY N/A 07/26/2017   Procedure: LAPAROSCOPIC CHOLECYSTECTOMY;  Surgeon: Clovis Riley, MD;  Location: [facility];  Service: General;  Laterality: N/A;   - Smoked ~1ppd since age 43, denies heavy alcohol use or illicit drug use. Full time employment.   reports that she has been smoking cigarettes. She has been smoking about 1.00 pack per day. She has never used smokeless tobacco. She reports current alcohol use. She reports that she does not use drugs. Allergies  Allergen Reactions  . Sulfa Antibiotics Rash and Hives   Family History  Problem Relation Age of Onset  . Diabetes Father   . Colon cancer Paternal Uncle        x 2  . Heart disease Maternal Grandfather   . Heart disease Paternal Grandfather    - Family history otherwise reviewed and not pertinent.  Prior to Admission medications   Medication Sig Start Date End Date Taking? Authorizing Provider  ALPRAZolam Duanne Moron) 0.5 MG tablet Take 0.25 mg by mouth daily as needed for anxiety.    Yes [provider]  citalopram (CELEXA) 20 MG tablet Take 20 mg by mouth at bedtime.  05/16/17  Yes [provider]  lisinopril (PRINIVIL,ZESTRIL) 10 MG tablet Take 10 mg by mouth daily.   Yes [provider]  loratadine (CLARITIN) 10 MG tablet Take 10 mg by mouth daily.   Yes [provider]  oxyCODONE-acetaminophen (PERCOCET/ROXICET) 5-325 MG tablet Take 1 tablet by mouth every 6 (six) hours as needed for severe pain. 07/26/17  Yes Clovis Riley, MD  pantoprazole (PROTONIX) 40 MG tablet TAKE 1 TAB WITH BREAKFAST EVERY MORNING. Patient taking differently: Take 40 mg by mouth daily.  05/21/18  Yes [place], Amy S, PA-C  PROAIR HFA 108 (90 Base) MCG/ACT inhaler Take 1 puff by mouth every 4 (four) hours as needed for shortness of breath.  06/28/17  Yes [provider]  rosuvastatin (CRESTOR) 10 MG tablet Take 10 mg by mouth every evening.   Yes [provider]  cholestyramine (QUESTRAN) 4 g packet Take 1 pack in juice or water once daily, in the am, 2 hours away from taking other medications. Patient not taking: Reported on 06/28/2018 03/13/18   Alfredia Ferguson, PA-C    Physical Exam: Vitals:   06/28/18 1351 06/28/18 1426 06/28/18 1430 06/28/18 1510  BP: 132/63 125/64 125/76 125/67  Pulse: (!) 120 (!) 114 (!) 114 83  Resp: 18 18 20 20   Temp:  98.4 F (36.9 C)    TempSrc:  Oral    SpO2: 92% 93% 92% 94%  Weight:      Height:       Constitutional: 58 y.o. female in no distress, calm demeanor Eyes: Lids and conjunctivae normal, PERRL ENMT: Mucous membranes are moist. Posterior pharynx erythematous clear of any exudate or lesions. Upper dentures.  Neck: normal, supple, no masses, no thyromegaly Respiratory: Non-labored tachypnea breathing supplemental oxygen without accessory muscle use. Clear breath sounds to auscultation bilaterally Cardiovascular: Regular tachycardia, no murmurs, rubs, or gallops. No carotid bruits. No JVD. No LE edema. + pedal pulses. Abdomen: Normoactive bowel sounds. No tenderness, non-distended, and no masses palpated. No hepatosplenomegaly. GU: No indwelling catheter Musculoskeletal: No clubbing / cyanosis. No joint deformity upper and lower extremities. Good ROM, no contractures. Normal muscle tone.  Skin: Warm, dry. No rashes, wounds, or ulcers. Diffuse photodermatosis.  Neurologic: CN II-XII grossly intact. Gait not assessed. Speech normal. No focal deficits in motor strength or sensation in all extremities.  Psychiatric: Alert and oriented x3. Normal judgment and insight. Mood euthymic with congruent affect.   Labs on Admission: I have personally reviewed following labs and imaging studies  CBC: Recent Labs  Lab 06/28/18 1144  WBC 8.4  NEUTROABS 5.9  HGB 13.2  HCT 42.2    MCV 96.6  PLT 660   Basic Metabolic Panel: Recent Labs  Lab 06/28/18 1144  NA 139  K 3.7  CL 103  CO2 27  GLUCOSE 92  BUN 10  CREATININE 0.75  CALCIUM 8.6*   GFR: Estimated Creatinine Clearance: 68.6 mL/min (by C-G formula based on SCr of 0.75 mg/dL). Liver Function Tests: Recent Labs  Lab 06/28/18 1144  AST 26  ALT 18  ALKPHOS 77  BILITOT 0.4  PROT 7.4  ALBUMIN 4.3   No results for input(s): LIPASE, AMYLASE in the last 168 hours. No results for input(s): AMMONIA in the last 168 hours. Coagulation Profile: No results for input(s): INR, PROTIME in the last 168 hours. Cardiac Enzymes: No results for input(s): CKTOTAL, CKMB, CKMBINDEX, TROPONINI in the last 168 hours. BNP (last 3 results) No results for input(s): PROBNP in the last 8760 hours. HbA1C: No results for input(s): HGBA1C in the last 72 hours. CBG: No results for input(s): GLUCAP in the last 168 hours. Lipid Profile: No results for input(s): CHOL, HDL, LDLCALC, TRIG, CHOLHDL, LDLDIRECT in the last 72 hours.  Thyroid Function Tests: No results for input(s): TSH, T4TOTAL, FREET4, T3FREE, THYROIDAB in the last 72 hours. Anemia Panel: No results for input(s): VITAMINB12, FOLATE, FERRITIN, TIBC, IRON, RETICCTPCT in the last 72 hours. Urine analysis:    Component Value Date/Time   COLORURINE YELLOW 06/28/2018 1325   APPEARANCEUR HAZY (A) 06/28/2018 1325   LABSPEC 1.014 06/28/2018 1325   PHURINE 5.0 06/28/2018 1325   GLUCOSEU NEGATIVE 06/28/2018 1325   HGBUR MODERATE (A) 06/28/2018 1325   BILIRUBINUR NEGATIVE 06/28/2018 1325   KETONESUR NEGATIVE 06/28/2018 1325   PROTEINUR NEGATIVE 06/28/2018 1325   UROBILINOGEN 0.2 10/18/2014 1738   NITRITE POSITIVE (A) 06/28/2018 1325   LEUKOCYTESUR NEGATIVE 06/28/2018 1325    No results found for this or any previous visit (from the past 240 hour(s)).   Radiological Exams on Admission: Dg Chest 2 View  Result Date: 06/28/2018 CLINICAL DATA:  Nonproductive cough  and shortness of breath beginning yesterday. Asthma. COPD. Smoker. EXAM: CHEST - 2 VIEW COMPARISON:  None. FINDINGS: The heart size and mediastinal contours are within normal limits. Aortic atherosclerosis. Mild pulmonary hyperinflation is consistent with COPD. Both lungs are clear. The visualized skeletal structures are unremarkable. IMPRESSION: COPD. No active cardiopulmonary disease. Electronically Signed   By: Earle Gell M.D.   On: 06/28/2018 11:17    EKG: Independently reviewed. Sinus tachycardia, vent rate 126 with baseline wander more prominent in limb leads. No ischemic ST-T changes. QTc 474msec.   Assessment/Plan Principal Problem:   Acute respiratory failure with hypoxia (HCC) Active Problems:   Chronic back pain   Anxiety   COPD with acute exacerbation ([facility])   Depression   GERD (gastroesophageal reflux disease)   Hyperlipidemia   Tobacco use   Acute hypoxic respiratory failure: Due to COPD exacerbation. Her first admission for COPD. No infiltrate on CXR, negative flu, normal lactic acid.   - Continue supplemental oxygen to maintain SpO2 >90%  COPD exacerbation:  - Scheduled and prn BDs - Continue IV steroids for now  Tobacco use:  - Extended counseling provided for cessation at time of admission. Will continue contemplating and discuss further with PCP.  - Nicotine patch   HTN:  - Continue home lisinopril  GERD:  - Continue PPI  HLD:  - Continue statin  Anxiety, depression:  - Continue celexa, prn xanax - Consider wellbutrin to aid with smoking cessation  Nitrite-positive bacteriuria: Asymptomatic, on dirty catch specimen without pyuria.  - No work up or intervention planned   DVT prophylaxis: Lovenox  Code Status: Full  Family Communication: At bedside Disposition Plan: Home once respiratory status stabilized Consults called: None  Admission status: Observation    [name], MD [facility] [email or web address] Password [facility] 06/28/2018, 3:31 PM

## 2018-06-29 ENCOUNTER — Other Ambulatory Visit: Payer: Self-pay

## 2018-06-29 DIAGNOSIS — F329 Major depressive disorder, single episode, unspecified: Secondary | ICD-10-CM | POA: Diagnosis present

## 2018-06-29 DIAGNOSIS — Z833 Family history of diabetes mellitus: Secondary | ICD-10-CM | POA: Diagnosis not present

## 2018-06-29 DIAGNOSIS — J9601 Acute respiratory failure with hypoxia: Secondary | ICD-10-CM | POA: Diagnosis present

## 2018-06-29 DIAGNOSIS — I10 Essential (primary) hypertension: Secondary | ICD-10-CM | POA: Diagnosis present

## 2018-06-29 DIAGNOSIS — E785 Hyperlipidemia, unspecified: Secondary | ICD-10-CM | POA: Diagnosis present

## 2018-06-29 DIAGNOSIS — F419 Anxiety disorder, unspecified: Secondary | ICD-10-CM | POA: Diagnosis present

## 2018-06-29 DIAGNOSIS — Z8249 Family history of ischemic heart disease and other diseases of the circulatory system: Secondary | ICD-10-CM | POA: Diagnosis not present

## 2018-06-29 DIAGNOSIS — Z716 Tobacco abuse counseling: Secondary | ICD-10-CM | POA: Diagnosis not present

## 2018-06-29 DIAGNOSIS — Z882 Allergy status to sulfonamides status: Secondary | ICD-10-CM | POA: Diagnosis not present

## 2018-06-29 DIAGNOSIS — K449 Diaphragmatic hernia without obstruction or gangrene: Secondary | ICD-10-CM | POA: Diagnosis present

## 2018-06-29 DIAGNOSIS — R8271 Bacteriuria: Secondary | ICD-10-CM | POA: Diagnosis present

## 2018-06-29 DIAGNOSIS — J302 Other seasonal allergic rhinitis: Secondary | ICD-10-CM | POA: Diagnosis present

## 2018-06-29 DIAGNOSIS — M5440 Lumbago with sciatica, unspecified side: Secondary | ICD-10-CM | POA: Diagnosis not present

## 2018-06-29 DIAGNOSIS — J441 Chronic obstructive pulmonary disease with (acute) exacerbation: Secondary | ICD-10-CM | POA: Diagnosis present

## 2018-06-29 DIAGNOSIS — Z79899 Other long term (current) drug therapy: Secondary | ICD-10-CM | POA: Diagnosis not present

## 2018-06-29 DIAGNOSIS — K219 Gastro-esophageal reflux disease without esophagitis: Secondary | ICD-10-CM | POA: Diagnosis present

## 2018-06-29 DIAGNOSIS — Z9049 Acquired absence of other specified parts of digestive tract: Secondary | ICD-10-CM | POA: Diagnosis not present

## 2018-06-29 DIAGNOSIS — Z90710 Acquired absence of both cervix and uterus: Secondary | ICD-10-CM | POA: Diagnosis not present

## 2018-06-29 DIAGNOSIS — M549 Dorsalgia, unspecified: Secondary | ICD-10-CM | POA: Diagnosis not present

## 2018-06-29 DIAGNOSIS — G894 Chronic pain syndrome: Secondary | ICD-10-CM | POA: Diagnosis present

## 2018-06-29 DIAGNOSIS — F17210 Nicotine dependence, cigarettes, uncomplicated: Secondary | ICD-10-CM | POA: Diagnosis present

## 2018-06-29 DIAGNOSIS — K760 Fatty (change of) liver, not elsewhere classified: Secondary | ICD-10-CM | POA: Diagnosis present

## 2018-06-29 LAB — HIV ANTIBODY (ROUTINE TESTING W REFLEX): HIV Screen 4th Generation wRfx: NONREACTIVE

## 2018-06-29 NOTE — Progress Notes (Signed)
Nutrition Brief Note  RD consulted via COPD protocol.  Pt with stable weights. Pt reported no weight loss.   Wt Readings from Last 15 Encounters:  06/28/18 63.5 kg  04/14/18 66.7 kg  03/13/18 66.7 kg  08/29/17 61.2 kg  07/26/17 61.9 kg  07/09/17 60.8 kg  07/04/17 60.8 kg  06/14/17 61 kg  12/24/16 63.5 kg  10/27/14 67.6 kg  10/18/14 67.6 kg  01/18/14 63.5 kg  02/12/12 62.5 kg  01/04/12 61.7 kg  01/02/12 61.7 kg    Body mass index is 24.03 kg/m. Patient meets criteria for normal based on current BMI.   Current diet order is regular. Labs and medications reviewed.   No nutrition interventions warranted at this time. If nutrition issues arise, please consult RD.   Clayton Bibles, MS, RD, [place] Dietitian Pager: [phone] After Hours Pager: [phone]

## 2018-06-29 NOTE — Progress Notes (Signed)
SATURATION QUALIFICATIONS: (This note is used to comply with regulatory documentation for home oxygen)  Patient Saturations on Room Air at Rest = 92%  Patient Saturations on Room Air while Ambulating = 89%  Patient Saturations on 1 Liters of oxygen while Ambulating = 93%  Please briefly explain why patient needs home oxygen:

## 2018-06-29 NOTE — Progress Notes (Signed)
PROGRESS NOTE  Valerie Key  [id number]:[id number] DOB: 1960/12/26 DOA: 06/28/2018 PCP: Default, Provider, MD   Brief Narrative: Valerie Key is a 58 y.o. female with a history of ~40 pack years tobacco use, COPD, depression, anxiety, DJD with chronic pain, HTN, HLD, and GERD who presented with increasing dyspnea and wheezing. Despite nebulized therapy, IV steroids, she continued to hypoxic so was admitted for hypoxic respiratory failure due to COPD exacerbation.   Assessment & Plan: Principal Problem:   Acute respiratory failure with hypoxia (HCC) Active Problems:   Chronic back pain   Anxiety   COPD with acute exacerbation (HCC)   Depression   GERD (gastroesophageal reflux disease)   Hyperlipidemia   Tobacco use  Acute hypoxic respiratory failure: Due to COPD exacerbation. Her first admission for COPD. No infiltrate on CXR, negative flu, normal lactic acid.   - Continue supplemental oxygen to maintain SpO2 >90%. Failed wean from oxygen today, still also subjectively very dyspneic worse than baseline. Will continue current treatments including IV steroids and frequent breathing treatments.   COPD exacerbation:  - Scheduled and prn BDs - Continue IV steroids, still some wheezing.  - Needs neb at home and formal PFTs/pulmonology evaluation as outpatient.  Tobacco use:  - Extended counseling provided for cessation at time of admission. Will continue contemplating and discuss further with PCP.  - Continue nicotine patch   HTN:  - Continue home lisinopril  GERD:  - Continue PPI  HLD:  - Continue statin  Anxiety, depression:  - Continue celexa, prn xanax - Consider wellbutrin to aid with smoking cessation  Nitrite-positive bacteriuria: Asymptomatic, on dirty catch specimen without pyuria.  - No work up or intervention planned  DVT prophylaxis: Lovenox Code Status: Full Family Communication: None at bedside Disposition Plan: Home once  returning closer to respiratory baseline. Remains with wheezing requiring IV steroids and frequent breathing treatments and remains newly hypoxic. Therefore, she has failed observation treatment and it is reasonable and appropriate to admit to inpatient.   Consultants:   None  Procedures:   None  Antimicrobials:  None   Subjective: Got severely dyspneic walking from bed to bed side chair (~5 feet). Can previously ambulate >138ft without dyspnea. Wheezing greatly improved with breathing treatments but returns within an hour or two afterward. Shortly after breathing treatment this morning, she was ambulating with pulse oximetry and dropped below 90% with severe dyspnea. +Cough, no chest pain or sputum.  Objective: Vitals:   06/29/18 0600 06/29/18 0815 06/29/18 1358 06/29/18 1402  BP:   113/87   Pulse: (!) 110 (!) 113 (!) 111   Resp:  18    Temp:   98.6 F (37 C)   TempSrc:   Oral   SpO2:  94% 94% 94%  Weight:      Height:        Intake/Output Summary (Last 24 hours) at 06/29/2018 1538 Last data filed at 06/29/2018 1300 Gross per 24 hour  Intake 480 ml  Output -  Net 480 ml   Filed Weights   06/28/18 1037  Weight: 63.5 kg    Gen: 58 y.o. female in no distress Pulm: Labored with prolonged conversation, end-expiratory wheezes bilaterally with prolonged expiratory phase.  CV: Regular tachycardia. No murmur, rub, or gallop. No JVD, no pedal edema. GI: Abdomen soft, non-tender, non-distended, with normoactive bowel sounds. No organomegaly or masses felt. Ext: Warm, no deformities Skin: No rashes, lesions no ulcers Neuro: Alert and oriented. No focal neurological deficits. Psych:  Judgement and insight appear normal. Mood & affect appropriate.   Data Reviewed: I have personally reviewed following labs and imaging studies  CBC: Recent Labs  Lab 06/28/18 1144  WBC 8.4  NEUTROABS 5.9  HGB 13.2  HCT 42.2  MCV 96.6  PLT 937   Basic Metabolic Panel: Recent Labs  Lab  06/28/18 1144  NA 139  K 3.7  CL 103  CO2 27  GLUCOSE 92  BUN 10  CREATININE 0.75  CALCIUM 8.6*   GFR: Estimated Creatinine Clearance: 68.6 mL/min (by C-G formula based on SCr of 0.75 mg/dL). Liver Function Tests: Recent Labs  Lab 06/28/18 1144  AST 26  ALT 18  ALKPHOS 77  BILITOT 0.4  PROT 7.4  ALBUMIN 4.3   No results for input(s): LIPASE, AMYLASE in the last 168 hours. No results for input(s): AMMONIA in the last 168 hours. Coagulation Profile: No results for input(s): INR, PROTIME in the last 168 hours. Cardiac Enzymes: No results for input(s): CKTOTAL, CKMB, CKMBINDEX, TROPONINI in the last 168 hours. BNP (last 3 results) No results for input(s): PROBNP in the last 8760 hours. HbA1C: No results for input(s): HGBA1C in the last 72 hours. CBG: No results for input(s): GLUCAP in the last 168 hours. Lipid Profile: No results for input(s): CHOL, HDL, LDLCALC, TRIG, CHOLHDL, LDLDIRECT in the last 72 hours. Thyroid Function Tests: No results for input(s): TSH, T4TOTAL, FREET4, T3FREE, THYROIDAB in the last 72 hours. Anemia Panel: No results for input(s): VITAMINB12, FOLATE, FERRITIN, TIBC, IRON, RETICCTPCT in the last 72 hours. Urine analysis:    Component Value Date/Time   COLORURINE YELLOW 06/28/2018 1325   APPEARANCEUR HAZY (A) 06/28/2018 1325   LABSPEC 1.014 06/28/2018 1325   PHURINE 5.0 06/28/2018 1325   GLUCOSEU NEGATIVE 06/28/2018 1325   HGBUR MODERATE (A) 06/28/2018 1325   BILIRUBINUR NEGATIVE 06/28/2018 1325   KETONESUR NEGATIVE 06/28/2018 1325   PROTEINUR NEGATIVE 06/28/2018 1325   UROBILINOGEN 0.2 10/18/2014 1738   NITRITE POSITIVE (A) 06/28/2018 1325   LEUKOCYTESUR NEGATIVE 06/28/2018 1325   No results found for this or any previous visit (from the past 240 hour(s)).    Radiology Studies: Dg Chest 2 View  Result Date: 06/28/2018 CLINICAL DATA:  Nonproductive cough and shortness of breath beginning yesterday. Asthma. COPD. Smoker. EXAM: CHEST -  2 VIEW COMPARISON:  None. FINDINGS: The heart size and mediastinal contours are within normal limits. Aortic atherosclerosis. Mild pulmonary hyperinflation is consistent with COPD. Both lungs are clear. The visualized skeletal structures are unremarkable. IMPRESSION: COPD. No active cardiopulmonary disease. Electronically Signed   By: Earle Gell M.D.   On: 06/28/2018 11:17    Scheduled Meds: . citalopram  20 mg Oral QHS  . enoxaparin (LOVENOX) injection  40 mg Subcutaneous Q24H  . ipratropium-albuterol  3 mL Nebulization TID  . lisinopril  10 mg Oral Daily  . loratadine  10 mg Oral Daily  . nicotine  21 mg Transdermal Daily  . pantoprazole  40 mg Oral Daily  . predniSONE  40 mg Oral Q breakfast  . rosuvastatin  10 mg Oral QPM  . sodium chloride flush  3 mL Intravenous Q12H   Continuous Infusions: . sodium chloride       LOS: 0 days   Time spent: 25 minutes.  [name], MD [facility] [email or web address] Password [facility] 06/29/2018, 3:38 PM

## 2018-06-30 MED ORDER — IPRATROPIUM-ALBUTEROL 0.5-2.5 (3) MG/3ML IN SOLN
3.0000 mL | Freq: Two times a day (BID) | RESPIRATORY_TRACT | Status: DC
  Administered 2018-06-30 – 2018-07-02 (×4): 3 mL via RESPIRATORY_TRACT
  Filled 2018-06-30 (×4): qty 3

## 2018-06-30 MED ORDER — ALBUTEROL SULFATE (2.5 MG/3ML) 0.083% IN NEBU: 2.5000 mg | INHALATION_SOLUTION | RESPIRATORY_TRACT | 0 refills | Status: DC | PRN

## 2018-06-30 MED ORDER — PREDNISONE 20 MG PO TABS: 40.0000 mg | ORAL_TABLET | Freq: Every day | ORAL | 0 refills | Status: DC

## 2018-06-30 MED ORDER — METHYLPREDNISOLONE SODIUM SUCC 40 MG IJ SOLR
40.0000 mg | Freq: Four times a day (QID) | INTRAMUSCULAR | Status: DC
  Administered 2018-06-30 – 2018-07-01 (×5): 40 mg via INTRAVENOUS
  Filled 2018-06-30 (×5): qty 1

## 2018-06-30 MED ORDER — NICOTINE 21 MG/24HR TD PT24: 21.0000 mg | MEDICATED_PATCH | Freq: Every day | TRANSDERMAL | 0 refills | Status: DC

## 2018-06-30 NOTE — Care Management Note (Signed)
Case Management Note  Patient Details  Name: Valerie Key MRN: [id number] Date of Birth: 12/01/1960  Subjective/Objective:                  Discharge planning  Action/Plan: [facility] /d[facility] notified of need for nebulizer and meds at 0900  Expected Discharge Date:  06/30/18               Expected Discharge Plan:  Home/Self Care  In-House Referral:     Discharge planning Services  CM Consult  Post Acute Care Choice:  Durable Medical Equipment Choice offered to:  NA  DME Arranged:  Nebulizer/meds DME Agency:  [place]:    [facility] Agency:     Status of Service:  Completed, signed off  If discussed at [place] of Stay Meetings, dates discussed:    Additional Comments:  Leeroy Cha, RN 06/30/2018, 9:00 AM

## 2018-06-30 NOTE — Progress Notes (Signed)
PROGRESS NOTE  Valerie Key  HYW:[id number] DOB: 14-Mar-1961 DOA: 06/28/2018 PCP: Default, Provider, MD   Brief Narrative: Valerie Key is a 58 y.o. female with a history of ~40 pack years tobacco use, COPD, depression, anxiety, DJD with chronic pain, HTN, HLD, and GERD who presented with increasing dyspnea and wheezing. Despite nebulized therapy, IV steroids, she continued to hypoxic so was admitted for hypoxic respiratory failure due to COPD exacerbation.   Assessment & Plan: Principal Problem:   Acute respiratory failure with hypoxia (HCC) Active Problems:   Chronic back pain   Anxiety   COPD with acute exacerbation (HCC)   Depression   GERD (gastroesophageal reflux disease)   Hyperlipidemia   Tobacco use  Acute hypoxic respiratory failure: Due to COPD exacerbation. Her first admission for COPD. No infiltrate on CXR, negative flu, normal lactic acid.   - Continue supplemental oxygen to maintain SpO2 >90%. Remains hypoxic even at rest.  COPD exacerbation:  - Scheduled and prn BDs - Plan was to transition to po, though has failed that attempt, will restart q6h IV steroids. - Needs neb at home and formal PFTs/pulmonology evaluation as outpatient.  Tobacco use:  - Extended counseling provided for cessation at time of admission. Will continue contemplating and discuss further with PCP.  - Continue nicotine patch. Prescribed for discharge.  HTN:  - Continue home lisinopril  GERD:  - Continue PPI  HLD:  - Continue statin  Anxiety, depression:  - Continue celexa, prn xanax - Consider wellbutrin to aid with smoking cessation  Nitrite-positive bacteriuria: Asymptomatic, on dirty catch specimen without pyuria.  - No work up or intervention planned  DVT prophylaxis: Lovenox Code Status: Full Family Communication: None at bedside Disposition Plan: Discharge home once hypoxia resolved and respiratory effort reduced with exertion.    Consultants:   None  Procedures:   None  Antimicrobials:  None   Subjective: This morning reported feeling much better, slept well. Breathing better so she took her oxygen off. Unfortunately when it was checked SpO2 was 88% at rest, dropped to low 80%'s with severe dyspnea walking in the hall. This is no where near baseline. No chest pain.   Objective: BP (!) 140/95 (BP Location: Right Arm)   Pulse 95   Temp 98.1 F (36.7 C) (Oral)   Resp 18   Ht 5\' 4"  (1.626 m)   Wt 63.5 kg   SpO2 91%   BMI 24.03 kg/m   Gen: 58 y.o. female in no distress Pulm: Nonlabored at rest this morning, though when getting up from bed RR goes >30, respiratory distress. Diminished throughout with scant end-expiratory wheezing though prolonged expiratory phase.  CV: Regular borderline tachycardia. No murmur, rub, or gallop. No JVD, no dependent edema. GI: Abdomen soft, non-tender, non-distended, with normoactive bowel sounds.  Ext: Warm, no deformities Skin: No rashes, lesions or ulcers on visualized skin. Neuro: Alert and oriented. No focal neurological deficits. Psych: Judgement and insight appear fair. Mood euthymic & affect congruent. Behavior is appropriate.    CBC: Recent Labs  Lab 06/28/18 1144  WBC 8.4  NEUTROABS 5.9  HGB 13.2  HCT 42.2  MCV 96.6  PLT 485   Basic Metabolic Panel: Recent Labs  Lab 06/28/18 1144  NA 139  K 3.7  CL 103  CO2 27  GLUCOSE 92  BUN 10  CREATININE 0.75  CALCIUM 8.6*   Liver Function Tests: Recent Labs  Lab 06/28/18 1144  AST 26  ALT 18  ALKPHOS 77  BILITOT 0.4  PROT 7.4  ALBUMIN 4.3   Time spent: 25 minutes.  [name], MD [facility] [email or web address] Password [facility] 06/30/2018, 12:06 PM

## 2018-07-01 MED ORDER — METHYLPREDNISOLONE SODIUM SUCC 40 MG IJ SOLR: 40.0000 mg | Freq: Two times a day (BID) | INTRAMUSCULAR | Status: DC

## 2018-07-01 NOTE — Progress Notes (Signed)
  PROGRESS NOTE  Valerie Key  VOZ:[id number] DOB: [birth year] DOA: 06/28/2018 PCP: Default, Provider, MD   Brief Narrative: Valerie Key is a 58 y.o. female with a history of ~40 pack years tobacco use, COPD, depression, anxiety, DJD with chronic pain, HTN, HLD, and GERD who presented with increasing dyspnea and wheezing. Despite nebulized therapy, IV steroids, she continued to hypoxic so was admitted for hypoxic respiratory failure due to COPD exacerbation.   Assessment & Plan: Principal Problem:   Acute respiratory failure with hypoxia (HCC) Active Problems:   Chronic back pain   Anxiety   COPD with acute exacerbation (HCC)   Depression   GERD (gastroesophageal reflux disease)   Hyperlipidemia   Tobacco use  Acute hypoxic respiratory failure: Due to COPD exacerbation. Her first admission for COPD. No infiltrate on CXR, negative flu, normal lactic acid.   - Continue supplemental oxygen to maintain SpO2 >90%. Hypoxia at rest improved, still on exertion and has dyspnea worse than baseline with some wheezing.   COPD exacerbation:  - Scheduled and prn BDs. - Continue IV steroids.  - Needs neb at home and formal PFTs/pulmonology evaluation as outpatient.  Tobacco use:  - Extended counseling provided for cessation at time of admission. Will continue contemplating and discuss further with PCP.  - Continue nicotine patch. Prescribe for discharge.  HTN:  - Continue home lisinopril  GERD:  - Continue PPI  HLD:  - Continue statin  Anxiety, depression:  - Continue celexa, prn xanax - Consider wellbutrin to aid with smoking cessation  Nitrite-positive bacteriuria: Asymptomatic, on dirty catch specimen without pyuria.  - No work up or intervention planned  DVT prophylaxis: Lovenox Code Status: Full Family Communication: None at bedside Disposition Plan: Discharge home once hypoxia resolved and respiratory effort reduced with  exertion.  Consultants:   None  Procedures:   None  Antimicrobials:  None  Subjective: Dyspnea is still severe when ambulating, but wants to continue to get up and walk around. No chest pain. Feels wheezing coming on and requires ongoing breathing treatments. This is not her recent baseline. Ambulatory pulse oximetry < 90%.   Objective: BP (!) 144/80 (BP Location: Right Arm)   Pulse 100   Temp 98.5 F (36.9 C)   Resp 20   Ht 5\' 4"  (1.626 m)   Wt 63.5 kg   SpO2 96%   BMI 24.03 kg/m   Gen: 58 y.o. female in no distress Pulm: Nonlabored but tachypneic when off oxygen at rest. End-expiratory wheezes noted. CV: Regular tachycardia. No murmur, rub, or gallop. No JVD, no dependent edema. GI: Abdomen soft, non-tender, non-distended, with normoactive bowel sounds.  Ext: Warm, no deformities Skin: No rashes, lesions or ulcers on visualized skin. Neuro: Alert and oriented. No focal neurological deficits. Psych: Judgement and insight appear fair. Mood euthymic & affect congruent. Behavior is appropriate.     Time spent: 25 minutes.  [name], MD [facility] [email or web address] Password [facility] 07/01/2018, 4:34 PM

## 2018-07-01 NOTE — Progress Notes (Signed)
SATURATION QUALIFICATIONS: (This note is used to comply with regulatory documentation for home oxygen)  Patient Saturations on Room Air at Rest = 91%  Patient Saturations on Room Air while Ambulating = 86%  Patient Saturations on 2 Liters of oxygen while Ambulating = 93%  Please briefly explain why patient needs home oxygen: 

## 2018-07-02 DIAGNOSIS — M5440 Lumbago with sciatica, unspecified side: Secondary | ICD-10-CM

## 2018-07-02 MED ORDER — ALBUTEROL SULFATE (2.5 MG/3ML) 0.083% IN NEBU: 2.5000 mg | INHALATION_SOLUTION | Freq: Three times a day (TID) | RESPIRATORY_TRACT | Status: DC

## 2018-07-02 MED ORDER — PROAIR HFA 108 (90 BASE) MCG/ACT IN AERS: 1.0000 | INHALATION_SPRAY | RESPIRATORY_TRACT | 3 refills | Status: DC | PRN

## 2018-07-02 MED ORDER — PREDNISONE 20 MG PO TABS: 40.0000 mg | ORAL_TABLET | Freq: Every day | ORAL | Status: DC

## 2018-07-02 MED ORDER — TIOTROPIUM BROMIDE MONOHYDRATE 18 MCG IN CAPS: 18.0000 ug | ORAL_CAPSULE | Freq: Every day | RESPIRATORY_TRACT | Status: DC

## 2018-07-02 MED ORDER — ALBUTEROL SULFATE (2.5 MG/3ML) 0.083% IN NEBU: 2.5000 mg | INHALATION_SOLUTION | RESPIRATORY_TRACT | 3 refills | Status: DC | PRN

## 2018-07-02 MED ORDER — FLUTICASONE FUROATE-VILANTEROL 200-25 MCG/INH IN AEPB
1.0000 | INHALATION_SPRAY | Freq: Every day | RESPIRATORY_TRACT | Status: DC
  Filled 2018-07-02: qty 28

## 2018-07-02 MED ORDER — UMECLIDINIUM BROMIDE 62.5 MCG/INH IN AEPB: 1.0000 | INHALATION_SPRAY | Freq: Every day | RESPIRATORY_TRACT | 4 refills | Status: DC

## 2018-07-02 MED ORDER — UMECLIDINIUM BROMIDE 62.5 MCG/INH IN AEPB
1.0000 | INHALATION_SPRAY | Freq: Every day | RESPIRATORY_TRACT | Status: DC
  Filled 2018-07-02: qty 7

## 2018-07-02 MED ORDER — PREDNISONE 20 MG PO TABS: 40.0000 mg | ORAL_TABLET | Freq: Every day | ORAL | 0 refills | Status: AC

## 2018-07-02 MED ORDER — NICOTINE 21 MG/24HR TD PT24: 21.0000 mg | MEDICATED_PATCH | Freq: Every day | TRANSDERMAL | 2 refills | Status: DC

## 2018-07-02 NOTE — Discharge Summary (Signed)
Physician Discharge Summary   Patient ID: Valerie Key MRN: [id number] DOB/AGE: 1960-09-16 58 y.o.  Admit date: 06/28/2018 Discharge date: 07/02/2018  Primary Care Physician:  Default, Provider, MD   Recommendations for Outpatient Follow-up:  1. Follow up with PCP in 1-2 weeks 2. Patient reports that she follows PCP, Dr. Edwina Key at [facility] where she [facility] 3. Outpatient follow-up with pulmonology scheduled  Home Health: None  Equipment/Devices:   Discharge Condition: stable  CODE STATUS: FULL  Diet recommendation: Heart healthy diet   Discharge Diagnoses:    . Acute respiratory failure with hypoxia ([place]) . Chronic back pain . Anxiety . COPD with acute exacerbation ([place]) . Depression . GERD (gastroesophageal reflux disease) . Hyperlipidemia . Tobacco use   Consults: None    Allergies:   Allergies  Allergen Reactions  . Sulfa Antibiotics Rash and Hives     DISCHARGE MEDICATIONS: Allergies as of 07/02/2018      Reactions   Sulfa Antibiotics Rash, Hives      Medication List    STOP taking these medications   cholestyramine 4 g packet Commonly known as:  QUESTRAN     TAKE these medications   albuterol (2.5 MG/3ML) 0.083% nebulizer solution Commonly known as:  PROVENTIL Take 3 mLs (2.5 mg total) by nebulization every 4 (four) hours as needed for wheezing or shortness of breath. What changed:  You were already taking a medication with the same name, and this prescription was added. Make sure you understand how and when to take each.   PROAIR HFA 108 (90 Base) MCG/ACT inhaler Generic drug:  albuterol Inhale 1 puff into the lungs every 4 (four) hours as needed for wheezing or shortness of breath. What changed:    how to take this  reasons to take this   ALPRAZolam 0.5 MG tablet Commonly known as:  XANAX Take 0.25 mg by mouth daily as needed for anxiety.   citalopram 20 MG tablet Commonly known as:  CELEXA Take 20 mg by mouth  at bedtime.   lisinopril 10 MG tablet Commonly known as:  PRINIVIL,ZESTRIL Take 10 mg by mouth daily.   loratadine 10 MG tablet Commonly known as:  CLARITIN Take 10 mg by mouth daily.   nicotine 21 mg/24hr patch Commonly known as:  NICODERM CQ - dosed in mg/24 hours Place 1 patch (21 mg total) onto the skin daily.   oxyCODONE-acetaminophen 5-325 MG tablet Commonly known as:  PERCOCET/ROXICET Take 1 tablet by mouth every 6 (six) hours as needed for severe pain.   pantoprazole 40 MG tablet Commonly known as:  PROTONIX TAKE 1 TAB WITH BREAKFAST EVERY MORNING. What changed:    how much to take  how to take this  when to take this  additional instructions   predniSONE 20 MG tablet Commonly known as:  DELTASONE Take 2 tablets (40 mg total) by mouth daily with breakfast for 5 days.   rosuvastatin 10 MG tablet Commonly known as:  CRESTOR Take 10 mg by mouth every evening.   umeclidinium bromide 62.5 MCG/INH Aepb Commonly known as:  INCRUSE ELLIPTA Inhale 1 puff into the lungs daily.            Durable Medical Equipment  (From admission, onward)         Start     Ordered   07/02/18 1029  For home use only DME Nebulizer/meds  Once    Question:  Patient needs a nebulizer to treat with the following condition  Answer:  COPD (chronic obstructive pulmonary disease) ([place])   07/02/18 1028   06/30/18 0847  For home use only DME Nebulizer/meds  Once    Question Answer Comment  Patient needs a nebulizer to treat with the following condition COPD (chronic obstructive pulmonary disease) ([place])   Patient needs a nebulizer to treat with the following condition COPD exacerbation ([place])      06/30/18 0847           Brief H and P: For complete details please refer to admission H and P, but in brief Valerie Key a 58 y.o.femalewith a history of~40 pack years tobacco use, COPD, depression, anxiety, DJD with chronic pain, HTN, HLD, and GERD who presented  with increasing dyspnea and wheezing. Despite nebulized therapy, IV steroids, she continued to hypoxic so was admitted for hypoxic respiratory failure due to COPD exacerbation.    Hospital Course:     Acute respiratory failure with hypoxia (HCC) contrary to acute COPD exacerbation -Much improved now, per patient this is her first admission for COPD.  Chest x-ray negative for infiltrates.  Influenza panel negative, normal lactic acid -Patient was placed on supplemental oxygen, did not need O2 at home O2 evaluation -Patient was initially placed on scheduled bronchodilators and IV steroids. -Wheezing has improved, patient placed on albuterol inhaler, Incruse Ellipta, prednisone 40 mg daily for 5 days with PPI -Patient was strongly recommended to quit smoking, given prescription for nicotine patch.  She does not want Wellbutrin or Chantix -Outpatient follow-up scheduled with pulmonology     Chronic back pain -Currently stable    Anxiety -Stable, continue Celexa, as needed Xanax.  She does not want Wellbutrin or Chantix.    GERD (gastroesophageal reflux disease) Continue PPI    Tobacco use Counseled strongly on tobacco cessation, placed on nicotine patch  Hyperlipidemia Continue statin  Day of Discharge S: Feels a whole lot better, wants to go home  BP (!) 151/91 (BP Location: Left Arm)   Pulse 90   Temp 98.1 F (36.7 C)   Resp 19   Ht 5\' 4"  (1.626 m)   Wt 63.5 kg   SpO2 92%   BMI 24.03 kg/m   Physical Exam: General: Alert and awake oriented x3 not in any acute distress. HEENT: anicteric sclera, pupils reactive to light and accommodation CVS: S1-S2 clear no murmur rubs or gallops Chest: clear to auscultation bilaterally, no wheezing rales or rhonchi Abdomen: soft nontender, nondistended, normal bowel sounds Extremities: no cyanosis, clubbing or edema noted bilaterally Neuro: Cranial nerves II-XII intact, no focal neurological deficits   The results of significant  diagnostics from this hospitalization (including imaging, microbiology, ancillary and laboratory) are listed below for reference.      Procedures/Studies:  Dg Chest 2 View  Result Date: 06/28/2018 CLINICAL DATA:  Nonproductive cough and shortness of breath beginning yesterday. Asthma. COPD. Smoker. EXAM: CHEST - 2 VIEW COMPARISON:  None. FINDINGS: The heart size and mediastinal contours are within normal limits. Aortic atherosclerosis. Mild pulmonary hyperinflation is consistent with COPD. Both lungs are clear. The visualized skeletal structures are unremarkable. IMPRESSION: COPD. No active cardiopulmonary disease. Electronically Signed   By: Earle Gell M.D.   On: 06/28/2018 11:17       LAB RESULTS: Basic Metabolic Panel: Recent Labs  Lab 06/28/18 1144  NA 139  K 3.7  CL 103  CO2 27  GLUCOSE 92  BUN 10  CREATININE 0.75  CALCIUM 8.6*   Liver Function Tests: Recent Labs  Lab 06/28/18 1144  AST 26  ALT 18  ALKPHOS 77  BILITOT 0.4  PROT 7.4  ALBUMIN 4.3   No results for input(s): LIPASE, AMYLASE in the last 168 hours. No results for input(s): AMMONIA in the last 168 hours. CBC: Recent Labs  Lab 06/28/18 1144  WBC 8.4  NEUTROABS 5.9  HGB 13.2  HCT 42.2  MCV 96.6  PLT 190   Cardiac Enzymes: No results for input(s): CKTOTAL, CKMB, CKMBINDEX, TROPONINI in the last 168 hours. BNP: Invalid input(s): POCBNP CBG: No results for input(s): GLUCAP in the last 168 hours.    Disposition and Follow-up: Discharge Instructions    Diet - low sodium heart healthy   Complete by:  As directed    Discharge instructions   Complete by:  As directed    Continue efforts to stop smoking as much as possible. Nicotine patches have been sent to your pharmacy which you may use until you follow up with your doctor.  - A nebulizer is provided to you for you to use albuterol as needed for wheezing or shortness of breath. If you remain short of breath or symptoms otherwise worsen, you  must seek medical attention right away.  - Continue taking prednisone for 2 more days (prescription sent to your pharmacy) - Follow up with [facility] Pulmonology for ongoing care and testing for COPD.       DISPOSITION: Home   DISCHARGE FOLLOW-UP Follow-up Information    Icard, Octavio Graves, DO Follow up on 07/16/2018.   Specialty:  Pulmonary Disease Why:  at 2:00pm. Please arrive 67mins early for paper work.   Contact information: [address] [place] [address] [phone]            Time coordinating discharge:  35 minutes  Signed:   Estill Cotta M.D. [facility] 07/02/2018, 11:19 AM Pager: [phone]

## 2018-07-02 NOTE — Progress Notes (Signed)
SATURATION QUALIFICATIONS: (This note is used to comply with regulatory documentation for home oxygen)  Patient Saturations on Room Air at Rest = 91%  Patient Saturations on Room Air while Ambulating = 88%   

## 2018-07-09 ENCOUNTER — Ambulatory Visit: Payer: Commercial Managed Care - PPO | Admitting: Internal Medicine

## 2018-07-09 ENCOUNTER — Encounter: Payer: Self-pay | Admitting: Internal Medicine

## 2018-07-09 VITALS — BP 90/58 | HR 84 | Ht 64.0 in | Wt 148.0 lb

## 2018-07-09 DIAGNOSIS — K219 Gastro-esophageal reflux disease without esophagitis: Secondary | ICD-10-CM | POA: Diagnosis not present

## 2018-07-09 DIAGNOSIS — R198 Other specified symptoms and signs involving the digestive system and abdomen: Secondary | ICD-10-CM | POA: Diagnosis not present

## 2018-07-09 DIAGNOSIS — K760 Fatty (change of) liver, not elsewhere classified: Secondary | ICD-10-CM

## 2018-07-09 MED ORDER — VITAMIN E 180 MG (400 UNIT) PO CAPS: 800.0000 [IU] | ORAL_CAPSULE | Freq: Every day | ORAL | 2 refills | Status: AC

## 2018-07-09 NOTE — Progress Notes (Signed)
Subjective:    Patient ID: Valerie Key, female    DOB: 28-Jan-1961, 58 y.o.   MRN: [id number]  HPI Valerie Key is a 58 yo female with PMH of with a past medical history of fatty liver with mild to moderate fibrosis, GERD with hiatal hernia, colonic diverticulosis who is here for follow-up.  She also has a history of COPD, degenerative disc disease, hyperlipidemia and tobacco use.  She is here alone today.  She was recently hospitalized with respiratory distress due to an upper respiratory illness.  She spent several days in the hospital but fortunately did not need mechanical ventilation.  She has improved and has follow-up with pulmonary next week.  We evaluated her for dark stools last year and she came for an upper endoscopy on 04/14/2018.  Other than a 2 cm hiatal hernia this was normal.  She also had had a colonoscopy on 07/04/2017 which showed a 5 mm rectal polyp found to be inflammatory.  There was diverticulosis in the sigmoid and the exam was otherwise normal.  She reports her black stools have resolved.  She seen no blood in her stool or melena.  Her bowel habits have returned to mostly normal for her which can be alternating loose stools with constipation.  No blood in her stool.  No abdominal pain.  Her reflux is been well controlled with pantoprazole 40 mg daily.  No dysphagia or odynophagia.  She has not yet started the vitamin D previously recommended for fatty liver.  She does drink alcohol and reports in years past she drank alcohol heavily.  Now she drinks 1 or 2 days a week but only 1 beer on any given day.  I had her stools tested for fecal occult blood and this was negative.  Review of Systems As per HPI, otherwise negative  Current Medications, Allergies, Past Medical History, Past Surgical History, Family History and Social History were reviewed in [facility] record.     Objective:   Physical Exam BP (!) 90/58   Pulse 84   Ht 5'  4" (1.626 m)   Wt 148 lb (67.1 kg)   BMI 25.40 kg/m  Constitutional: Well-developed and well-nourished. No distress. HEENT: Normocephalic and atraumatic.  Conjunctivae are normal.  No scleral icterus. Neck: Neck supple. Trachea midline. Cardiovascular: Normal rate, regular rhythm and intact distal pulses. No M/R/G Pulmonary/chest: Effort normal and breath sounds normal. No wheezing, rales or rhonchi. Abdominal: Soft, nontender, nondistended. Bowel sounds active throughout. There are no masses palpable. No hepatosplenomegaly. Extremities: no clubbing, cyanosis, or edema Neurological: Alert and oriented to person place and time. Skin: Skin is warm and dry. Psychiatric: Normal mood and affect. Behavior is normal.  CBC    Component Value Date/Time   WBC 8.4 06/28/2018 1144   RBC 4.37 06/28/2018 1144   HGB 13.2 06/28/2018 1144   HCT 42.2 06/28/2018 1144   PLT 190 06/28/2018 1144   MCV 96.6 06/28/2018 1144   MCH 30.2 06/28/2018 1144   MCHC 31.3 06/28/2018 1144   RDW 13.0 06/28/2018 1144   LYMPHSABS 1.4 06/28/2018 1144   MONOABS 1.0 06/28/2018 1144   EOSABS 0.1 06/28/2018 1144   BASOSABS 0.0 06/28/2018 1144   CMP     Component Value Date/Time   NA 139 06/28/2018 1144   K 3.7 06/28/2018 1144   CL 103 06/28/2018 1144   CO2 27 06/28/2018 1144   GLUCOSE 92 06/28/2018 1144   BUN 10 06/28/2018 1144   CREATININE  0.75 06/28/2018 1144   CALCIUM 8.6 (L) 06/28/2018 1144   PROT 7.4 06/28/2018 1144   ALBUMIN 4.3 06/28/2018 1144   AST 26 06/28/2018 1144   ALT 18 06/28/2018 1144   ALKPHOS 77 06/28/2018 1144   BILITOT 0.4 06/28/2018 1144   GFRNONAA >60 06/28/2018 1144   GFRAA >60 06/28/2018 1144      Assessment & Plan:  58 yo female with PMH of with a past medical history of fatty liver with mild to moderate fibrosis, GERD with hiatal hernia, colonic diverticulosis who is here for follow-up.   1.  Dark stools --resolved.  Upper endoscopy and colonoscopy unrevealing.  Stool is  heme-negative.  No further evaluation needed at present.  2.  GERD with hiatal hernia --symptoms well controlled with pantoprazole.  Continue pantoprazole 40 mg daily.  We discussed the risks, benefits and alternatives to chronic PPI therapy.  3.  Fatty liver --she does have F2 fibrosis by elastography.  I stressed the importance of maintaining a diet and exercise program possible.  I would like her to take vitamin E 800 IU daily.  There is no evidence of cirrhosis or advanced liver disease at this time.  I cautioned her against heavy alcohol use.  4.  Alternating bowel habits --begin Benefiber 1 working to 2 tablespoons daily.  If not helpful notify me.  Follow-up in 1 year, sooner if needed  25 minutes spent with the patient today. Greater than 50% was spent in counseling and coordination of care with the patient

## 2018-07-09 NOTE — Patient Instructions (Signed)
Continue pantoprazole 40 mg daily.  We have sent the following medications to your pharmacy for you to pick up at your convenience: Vitamin E 800 mg daily  Please purchase the following medications over the counter and take as directed: Benefiber 1 tablespoon working to 2 tablespoons daily  If you are age 56 or older, your body mass index should be between 23-30. Your Body mass index is 25.4 kg/m. If this is out of the aforementioned range listed, please consider follow up with your Primary Care Provider.  If you are age 68 or younger, your body mass index should be between 19-25. Your Body mass index is 25.4 kg/m. If this is out of the aformentioned range listed, please consider follow up with your Primary Care Provider.

## 2018-07-16 ENCOUNTER — Ambulatory Visit: Payer: Commercial Managed Care - PPO | Admitting: Internal Medicine

## 2018-07-16 ENCOUNTER — Encounter: Payer: Self-pay | Admitting: Internal Medicine

## 2018-07-16 DIAGNOSIS — J449 Chronic obstructive pulmonary disease, unspecified: Secondary | ICD-10-CM | POA: Insufficient documentation

## 2018-07-16 DIAGNOSIS — I10 Essential (primary) hypertension: Secondary | ICD-10-CM

## 2018-07-16 DIAGNOSIS — F17210 Nicotine dependence, cigarettes, uncomplicated: Secondary | ICD-10-CM | POA: Diagnosis not present

## 2018-07-16 MED ORDER — BUDESONIDE-FORMOTEROL FUMARATE 80-4.5 MCG/ACT IN AERO: 2.0000 | INHALATION_SPRAY | Freq: Two times a day (BID) | RESPIRATORY_TRACT | 12 refills | Status: DC

## 2018-07-16 NOTE — Patient Instructions (Addendum)
The key is to stop smoking completely before smoking completely stops you! - For smoking cessation classes call [phone]      Plan A = Automatic = Symbicort 80 Take 2 puffs first thing in am and then another 2 puffs about 12 hours later.    Work on inhaler technique:  relax and gently blow all the way out then take a nice smooth deep breath back in, triggering the inhaler at same time you start breathing in.  Hold for up to 5 seconds if you can. Blow out thru nose. Rinse and gargle with water when done      Plan B = Backup Only use your albuterol inhaler(proair) as a rescue medication to be used if you can't catch your breath by resting or doing a relaxed purse lip breathing pattern.  - The less you use it, the better it will work when you need it. - Ok to use the inhaler up to 2 puffs  every 4 hours if you must but call for appointment if use goes up over your usual need - Don't leave home without it !!  (think of it like the spare tire for your car)    Please schedule a follow up office visit in 4 weeks, sooner if needed with PFTs on return

## 2018-07-16 NOTE — Progress Notes (Signed)
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 Acute respiratory failure with hypoxia ([place]) . Chronic back pain . Anxiety . COPD with acute exacerbation ([place]) . Depression . GERD (gastroesophageal reflux disease) . Hyperlipidemia . Tobacco use   History of Present Illness  07/16/2018  Pulmonary/ 1st office eval/Clint Biello / active smoking  Chief Complaint  Patient presents with  . Pulmonary Consult    Referred by Dr Tana Coast for eval of COPD. Pt states her breathing is "great". She stopped Breo approx 1 wk ago due to thrush. She has a rescue inhaler that she uses once every 2 wks approx.   Dyspnea:  More doe x MMRC2 = can't walk a nl pace on a flat grade s sob but does fine slow and flat Cough: minimum in am  Sleep: bed is flat with big pillow SABA use: back to baseline / rarely need    No obvious day to day or daytime variability or assoc excess/ purulent sputum or mucus plugs or hemoptysis or cp or chest tightness, subjective wheeze or overt sinus or hb symptoms.   Sleeping  without nocturnal  or early am exacerbation  of respiratory  c/o's or need for noct saba. Also denies any obvious fluctuation of symptoms with weather or environmental changes or other aggravating or alleviating factors except as outlined above   No unusual exposure hx or h/o childhood pna/ asthma or knowledge of premature birth.  Current Allergies, Complete Past Medical History, Past Surgical History, Family History, and Social History were reviewed in [facility]  record.  ROS  The following are not active complaints unless bolded Hoarseness, sore throat better off Breo, dysphagia, dental problems, itching, sneezing,  nasal congestion or discharge of excess mucus or purulent secretions, ear ache,   fever, chills, sweats, unintended wt loss or wt gain, classically pleuritic or exertional cp,  orthopnea pnd or arm/hand swelling  or leg swelling, presyncope, palpitations, abdominal pain, anorexia, nausea, vomiting, diarrhea  or change in bowel habits or change in bladder habits, change in stools or change in urine, dysuria, hematuria,  rash, arthralgias, visual complaints, headache, numbness, weakness or ataxia or problems with walking or coordination,  change in mood or  memory.               Past Medical History:  Diagnosis Date  . Allergy    SEASONAL  . Anxiety   . Arthritis   . Asthma   . Back pain   . Chronic pain syndrome   . COPD (chronic obstructive pulmonary disease) ([place])   . Depression   . Diverticulosis   . Fatty liver   . Gastritis   . GERD (gastroesophageal reflux disease)   . Hiatal hernia   . History of kidney stones   . Hyperlipidemia     Outpatient Medications Prior to Visit  Medication Sig Dispense Refill  . ALPRAZolam (XANAX) 0.5 MG tablet Take 0.25 mg by mouth daily as needed for anxiety.     . citalopram (CELEXA) 20 MG tablet Take  20 mg by mouth at bedtime.   1  . HYDROcodone-acetaminophen (NORCO) 10-325 MG tablet Take 1 tablet by mouth every 4 (four) hours as needed.    Marland Kitchen lisinopril (PRINIVIL,ZESTRIL) 10 MG tablet Take 10 mg by mouth daily.    Marland Kitchen loratadine (CLARITIN) 10 MG tablet Take 10 mg by mouth daily.    . nicotine (NICODERM CQ - DOSED IN MG/24 HOURS) 21 mg/24hr patch Place 1 patch (21 mg total) onto the skin daily. 28 patch 2  . pantoprazole (PROTONIX) 40 MG tablet TAKE 1 TAB WITH BREAKFAST EVERY MORNING. (Patient taking differently: Take 40 mg by mouth daily. ) 30 tablet 11  . PROAIR HFA 108 (90 Base) MCG/ACT  inhaler Inhale 1 puff into the lungs every 4 (four) hours as needed for wheezing or shortness of breath. 1 Inhaler 3  . rosuvastatin (CRESTOR) 10 MG tablet Take 10 mg by mouth every evening.    . vitamin E (VITAMIN E) 400 UNIT capsule Take 2 capsules (800 Units total) by mouth daily. 60 capsule 2  . albuterol (PROVENTIL) (2.5 MG/3ML) 0.083% nebulizer solution Take 3 mLs (2.5 mg total) by nebulization every 4 (four) hours as needed for wheezing or shortness of breath. 75 mL 3  . umeclidinium bromide (INCRUSE ELLIPTA) 62.5 MCG/INH AEPB Inhale 1 puff into the lungs daily. 30 each 4     Objective:     BP 116/64 (BP Location: Left Arm, Cuff Size: Normal)   Pulse (!) 107   Ht 5\' 4"  (1.626 m)   Wt 152 lb 9.6 oz (69.2 kg)   SpO2 95%   BMI 26.19 kg/m   SpO2: 95 % RA   HEENT: edentulous  oropharynx. Nl external ear canals without cough reflex -  Mild bilateral non-specific turbinate edema     NECK :  without JVD/Nodes/TM/ nl carotid upstrokes bilaterally   LUNGS: no acc muscle use,  Mild barrel  contour chest wall with bilateral  Distant bs s audible wheeze and  without cough on insp or exp maneuver and mild  Hyperresonant  to  percussion bilaterally     CV:  RRR  no s3 or murmur or increase in P2, and no edema   ABD:  soft and nontender with pos late insp Hoover's  in the supine position. No bruits or organomegaly appreciated, bowel sounds nl  MS:   Nl gait/  ext warm without deformities, calf tenderness, cyanosis or clubbing No obvious joint restrictions   SKIN: warm and dry without lesions    NEURO:  alert, approp, nl sensorium with  no motor or cerebellar deficits apparent.         I personally reviewed images and agree with radiology impression as follows:  CXR:  06/28/18 COPD. No active cardiopulmonary disease.    Assessment   COPD GOLD 2  Active smoker - alpha one AT levels 06/14/17 = 156  - Spirometry 07/16/2018  FEV1 1.4 (53%)  Ratio 0.58 typical curvature   -  07/16/2018  After extensive coaching inhaler device,  effectiveness =    75% with hfa so try symbicort 160 2bid   She has moderately severe copd and unfortunately still smoking and may be sensitive to inhalers, esp DPI while on ACEi  (which blocks bradykinin degradation so the upper airway in pts on it becomes super sensitive to insults such as gerd/ pnds/ inhalers)   Will try symbicort 160 2bid but would have very low threshold to change to alternative bp med (see hbp)   >>>  F/u with pfts in 4 weeks      Essential hypertension ACE inhibitors are problematic in  pts with airway complaints because  even experienced pulmonologists can't always distinguish ace effects from copd/asthma.  By themselves they don't actually cause a problem, much like oxygen can't by itself start a fire, but they certainly serve as a powerful catalyst or enhancer for any "fire"  or inflammatory process in the upper airway, be it caused by an ET  tube or more commonly reflux (especially in the obese or pts with known GERD or who are on biphoshonates).    In the era of ARB near equivalency it may be necessary to avoid ACEI  entirely in the short run and then decide later, having established a level of airway control using a reasonable limited regimen, whether to add back ace but even then being very careful to observe the pt for worsening airway control and number of meds used/ needed to control symptoms.    >>> for now no change rx     Cigarette smoker Counseled re importance of smoking cessation but did not meet time criteria for separate billing         Total time devoted to counseling  > 50 % of initial 60 min office visit:  review case with pt/  device teaching which extended face to face time for this visit/ discussion of options/alternatives/ personally creating written customized instructions  in presence of pt  then going over those specific  Instructions directly with the pt including how to use all of  the meds but in particular covering each new medication in detail and the difference between the maintenance= "automatic" meds and the prns using an action plan format for the latter (If this problem/symptom => do that organization reading Left to right).  Please see AVS from this visit for a full list of these instructions which I personally wrote for this pt and  are unique to this visit.      Christinia Gully, MD 07/16/2018

## 2018-07-17 ENCOUNTER — Encounter: Payer: Self-pay | Admitting: Internal Medicine

## 2018-07-17 DIAGNOSIS — I10 Essential (primary) hypertension: Secondary | ICD-10-CM | POA: Insufficient documentation

## 2018-07-17 NOTE — Assessment & Plan Note (Signed)
Counseled re importance of smoking cessation but did not meet time criteria for separate billing      Total time devoted to counseling  > 50 % of initial 60 min office visit:  review case with pt/  device teaching which extended face to face time for this visit / discussion of options/alternatives/ personally creating written customized instructions  in presence of pt  then going over those specific  Instructions directly with the pt including how to use all of the meds but in particular covering each new medication in detail and the difference between the maintenance= "automatic" meds and the prns using an action plan format for the latter (If this problem/symptom => do that organization reading Left to right).  Please see AVS from this visit for a full list of these instructions which I personally wrote for this pt and  are unique to this visit.  

## 2018-07-17 NOTE — Assessment & Plan Note (Signed)
Active smoker - alpha one AT levels 06/14/17 = 156  - Spirometry 07/16/2018  FEV1 1.4 (53%)  Ratio 0.58 typical curvature   - 07/16/2018  After extensive coaching inhaler device,  effectiveness =    75% with hfa so try symbicort 160 2bid   She has moderately severe copd and unfortunately still smoking and may be sensitive to inhalers, esp DPI while on ACEi  (which blocks bradykinin degradation so the upper airway in pts on it becomes super sensitive to insults such as gerd/ pnds/ inhalers)   Will try symbicort 160 2bid but would have very low threshold to change to alternative bp med (see hbp)   >>>   F/u with pfts in 4 weeks

## 2018-07-17 NOTE — Assessment & Plan Note (Signed)
ACE inhibitors are problematic in  pts with airway complaints because  even experienced pulmonologists can't always distinguish ace effects from copd/asthma.  By themselves they don't actually cause a problem, much like oxygen can't by itself start a fire, but they certainly serve as a powerful catalyst or enhancer for any "fire"  or inflammatory process in the upper airway, be it caused by an ET  tube or more commonly reflux (especially in the obese or pts with known GERD or who are on biphoshonates).    In the era of ARB near equivalency it may be necessary to avoid ACEI  entirely in the short run and then decide later, having established a level of airway control using a reasonable limited regimen, whether to add back ace but even then being very careful to observe the pt for worsening airway control and number of meds used/ needed to control symptoms.    >>> for now no change rx

## 2018-08-14 ENCOUNTER — Other Ambulatory Visit: Payer: Self-pay | Admitting: *Deleted

## 2018-08-14 DIAGNOSIS — J449 Chronic obstructive pulmonary disease, unspecified: Secondary | ICD-10-CM

## 2018-08-15 ENCOUNTER — Ambulatory Visit: Payer: Commercial Managed Care - PPO | Admitting: Primary Care

## 2018-08-15 ENCOUNTER — Other Ambulatory Visit: Payer: Self-pay

## 2018-08-15 ENCOUNTER — Encounter: Payer: Self-pay | Admitting: Primary Care

## 2018-08-15 ENCOUNTER — Ambulatory Visit (INDEPENDENT_AMBULATORY_CARE_PROVIDER_SITE_OTHER): Payer: Commercial Managed Care - PPO | Admitting: Internal Medicine

## 2018-08-15 VITALS — BP 104/68 | HR 101 | Ht 64.0 in | Wt 153.0 lb

## 2018-08-15 DIAGNOSIS — J449 Chronic obstructive pulmonary disease, unspecified: Secondary | ICD-10-CM

## 2018-08-15 DIAGNOSIS — F17210 Nicotine dependence, cigarettes, uncomplicated: Secondary | ICD-10-CM | POA: Diagnosis not present

## 2018-08-15 LAB — PULMONARY FUNCTION TEST
DL/VA % pred: 74 %
DL/VA: 3.19 ml/min/mmHg/L
DLCO UNC % PRED: 78 %
DLCO cor % pred: 78 %
DLCO cor: 16.23 ml/min/mmHg
DLCO unc: 16.13 ml/min/mmHg
FEF 25-75 Post: 0.45 L/sec
FEF 25-75 Pre: 0.5 L/sec
FEF2575-%Change-Post: -9 %
FEF2575-%PRED-PRE: 19 %
FEF2575-%Pred-Post: 17 %
FEV1-%Change-Post: -5 %
FEV1-%Pred-Post: 39 %
FEV1-%Pred-Pre: 41 %
FEV1-Post: 1.05 L
FEV1-Pre: 1.12 L
FEV1FVC-%Change-Post: -4 %
FEV1FVC-%Pred-Pre: 60 %
FEV6-%Change-Post: 0 %
FEV6-%Pred-Post: 65 %
FEV6-%Pred-Pre: 66 %
FEV6-Post: 2.18 L
FEV6-Pre: 2.2 L
FEV6FVC-%Change-Post: 1 %
FEV6FVC-%Pred-Post: 99 %
FEV6FVC-%Pred-Pre: 98 %
FVC-%Change-Post: -1 %
FVC-%Pred-Post: 66 %
FVC-%Pred-Pre: 67 %
FVC-Post: 2.28 L
FVC-Pre: 2.31 L
POST FEV1/FVC RATIO: 46 %
Post FEV6/FVC ratio: 96 %
Pre FEV1/FVC ratio: 48 %
Pre FEV6/FVC Ratio: 95 %
RV % pred: 208 %
RV: 3.94 L
TLC % pred: 125 %
TLC: 6.37 L

## 2018-08-15 MED ORDER — TIOTROPIUM BROMIDE MONOHYDRATE 1.25 MCG/ACT IN AERS: 2.0000 | INHALATION_SPRAY | Freq: Every day | RESPIRATORY_TRACT | 0 refills | Status: DC

## 2018-08-15 NOTE — Patient Instructions (Addendum)
Referral: Lung cancer screening clinic  Medications: Add Spiriva 1.25- take 2 puffs once daily (sample given, if helps let us know will send in RX) Continue Symbicort - take 2 puffs twice daily Back up - Albuterol rescue inhaler as needed every 4 hours for shortness of breath/wheezing   Follow-up: 3-4 months with Dr. Melvyn Novas or sooner if breathing worsening  Due for Pneumococcal 23 vaccine at next visits (d/t smoking hx and age)

## 2018-08-15 NOTE — Assessment & Plan Note (Signed)
-   57 year old female, current smoker (58 pack year hx) - Refer to low dose lung cancer screening program - Due for pneumococcal vaccine at next visit

## 2018-08-15 NOTE — Assessment & Plan Note (Addendum)
-   PFTs showed severe airway obstruction, Ratio 46, FEV1 1.05 (39%). Significantly reduced mid-flows, no BD response. (Patient used Symbicort 5 hours prior to pfts) - Continue Symbicort 80 2bid, add spiriva 1.16mcg - FU in 3-4 months or if symptoms worsen

## 2018-08-15 NOTE — Progress Notes (Signed)
Full PFT performed today. °

## 2018-08-15 NOTE — Progress Notes (Signed)
@Patient  ID: Valerie Key, female    DOB: 05/22/1961, 58 y.o.   MRN: [id number]  Chief Complaint  Patient presents with  . Follow-up    breathing good    Referring provider: No ref. provider found  HPI: 58 year old female, current every day (58 pack year hx). PMH significant for COPD GOLD 2, acute respiratory failure with hypoxia, GERD, cigarette smoker. Patient of Dr. Melvyn Novas, last seen on 07/16/18. Maintained on Symbicort 80.   08/15/2018 Patient presents today for 1 month follow-up. Reports that she feels wonderful. No acute complaints. She does have a congested cough in the morning. Occasionally shortness of breath with stairs. Continues smoking, down to 10 cigarettes a day. PFTs showed severe airway obstruction. She may have had some trouble with test as her results were significantly worse than spirometry in February. No problems swallowing, no significant dysphagia, voice change.    Testing: >> 08/15/2018 PFTs - FVC 2.28 (66%), FEV1 1.05 (39%), ratio 46, significantly reduced mid-flows, no BD response after SABA. Flattened curvature. (Patient used Symbicort 5 hours before PFTs)  >> 07/16/18 Spirometry- FVC 2.5 (72%), FEV1 1.4 (53%), ratio 58  >> 06/14/17 Alpha 1- 156  Allergies  Allergen Reactions  . Sulfa Antibiotics Rash and Hives     There is no immunization history on file for this patient.  Past Medical History:  Diagnosis Date  . Allergy    SEASONAL  . Anxiety   . Arthritis   . Asthma   . Back pain   . Chronic pain syndrome   . COPD (chronic obstructive pulmonary disease) ([place])   . Depression   . Diverticulosis   . Fatty liver   . Gastritis   . GERD (gastroesophageal reflux disease)   . Hiatal hernia   . History of kidney stones   . Hyperlipidemia     Tobacco History: Social History   Tobacco Use  Smoking Status Current Every Day Smoker  . Packs/day: 1.50  . Years: 39.00  . Pack years: 58.50  . Types: Cigarettes  Smokeless Tobacco  Never Used  Tobacco Comment   10 cigarettes/day   Ready to quit: Not Answered Counseling given: Not Answered Comment: 10 cigarettes/day   Outpatient Medications Prior to Visit  Medication Sig Dispense Refill  . ALPRAZolam (XANAX) 0.5 MG tablet Take 0.25 mg by mouth daily as needed for anxiety.     . budesonide-formoterol (SYMBICORT) 80-4.5 MCG/ACT inhaler Inhale 2 puffs into the lungs 2 (two) times daily. 1 Inhaler 12  . citalopram (CELEXA) 20 MG tablet Take 20 mg by mouth at bedtime.   1  . HYDROcodone-acetaminophen (NORCO) 10-325 MG tablet Take 1 tablet by mouth every 4 (four) hours as needed.    Marland Kitchen lisinopril (PRINIVIL,ZESTRIL) 10 MG tablet Take 10 mg by mouth daily.    Marland Kitchen loratadine (CLARITIN) 10 MG tablet Take 10 mg by mouth daily.    . nicotine (NICODERM CQ - DOSED IN MG/24 HOURS) 21 mg/24hr patch Place 1 patch (21 mg total) onto the skin daily. 28 patch 2  . pantoprazole (PROTONIX) 40 MG tablet TAKE 1 TAB WITH BREAKFAST EVERY MORNING. (Patient taking differently: Take 40 mg by mouth daily. ) 30 tablet 11  . PROAIR HFA 108 (90 Base) MCG/ACT inhaler Inhale 1 puff into the lungs every 4 (four) hours as needed for wheezing or shortness of breath. 1 Inhaler 3  . rosuvastatin (CRESTOR) 10 MG tablet Take 10 mg by mouth every evening.    . vitamin  E (VITAMIN E) 400 UNIT capsule Take 2 capsules (800 Units total) by mouth daily. 60 capsule 2   No facility-administered medications prior to visit.     Review of Systems  Review of Systems  Constitutional: Negative.   Respiratory: Positive for cough and wheezing. Negative for shortness of breath.   Cardiovascular: Negative.     Physical Exam  BP 104/68 (BP Location: Right Arm, Cuff Size: Normal)   Pulse (!) 101   Ht 5\' 4"  (1.626 m)   Wt 153 lb (69.4 kg)   SpO2 95%   BMI 26.26 kg/m  Physical Exam Constitutional:      Appearance: Normal appearance. She is not ill-appearing.  HENT:     Head: Normocephalic and atraumatic.     Right  Ear: Tympanic membrane normal.     Left Ear: Tympanic membrane normal.     Mouth/Throat:     Mouth: Mucous membranes are moist.     Pharynx: Oropharynx is clear.  Neck:     Musculoskeletal: Normal range of motion and neck supple. No muscular tenderness.  Cardiovascular:     Rate and Rhythm: Normal rate and regular rhythm.  Pulmonary:     Breath sounds: Wheezing and rhonchi present.     Comments: Dull rhonchi and insp wheeze Musculoskeletal: Normal range of motion.  Skin:    General: Skin is warm and dry.  Neurological:     General: No focal deficit present.     Mental Status: She is alert and oriented to person, place, and time. Mental status is at baseline.  Psychiatric:        Mood and Affect: Mood normal.        Behavior: Behavior normal.        Thought Content: Thought content normal.        Judgment: Judgment normal.      Lab Results:  CBC    Component Value Date/Time   WBC 8.4 06/28/2018 1144   RBC 4.37 06/28/2018 1144   HGB 13.2 06/28/2018 1144   HCT 42.2 06/28/2018 1144   PLT 190 06/28/2018 1144   MCV 96.6 06/28/2018 1144   MCH 30.2 06/28/2018 1144   MCHC 31.3 06/28/2018 1144   RDW 13.0 06/28/2018 1144   LYMPHSABS 1.4 06/28/2018 1144   MONOABS 1.0 06/28/2018 1144   EOSABS 0.1 06/28/2018 1144   BASOSABS 0.0 06/28/2018 1144    BMET    Component Value Date/Time   NA 139 06/28/2018 1144   K 3.7 06/28/2018 1144   CL 103 06/28/2018 1144   CO2 27 06/28/2018 1144   GLUCOSE 92 06/28/2018 1144   BUN 10 06/28/2018 1144   CREATININE 0.75 06/28/2018 1144   CALCIUM 8.6 (L) 06/28/2018 1144   GFRNONAA >60 06/28/2018 1144   GFRAA >60 06/28/2018 1144    BNP No results found for: BNP  ProBNP No results found for: PROBNP  Imaging: No results found.   Assessment & Plan:   COPD GOLD 2  - PFTs showed severe airway obstruction, Ratio 46, FEV1 1.05 (39%). Significantly reduced mid-flows, no BD response. (Patient used Symbicort 5 hours prior to pfts) -  Continue Symbicort 80 2bid, add spiriva 1.63mcg - FU in 3-4 months or if symptoms worsen  Cigarette smoker - 58 year old female, current smoker (58 pack year hx) - Refer to low dose lung cancer screening program - Due for pneumococcal vaccine at next visit    Martyn Ehrich, NP 08/15/2018

## 2018-08-18 ENCOUNTER — Other Ambulatory Visit: Payer: Self-pay | Admitting: Acute Care

## 2018-08-18 DIAGNOSIS — Z122 Encounter for screening for malignant neoplasm of respiratory organs: Secondary | ICD-10-CM

## 2018-08-18 DIAGNOSIS — F17210 Nicotine dependence, cigarettes, uncomplicated: Principal | ICD-10-CM

## 2018-08-21 NOTE — Progress Notes (Signed)
Chart and office note reviewed in detail  > agree with a/p as outlined    

## 2018-09-10 ENCOUNTER — Encounter: Payer: Commercial Managed Care - PPO | Admitting: Acute Care

## 2018-09-10 ENCOUNTER — Ambulatory Visit: Payer: Commercial Managed Care - PPO

## 2018-09-22 ENCOUNTER — Telehealth: Payer: Self-pay | Admitting: Internal Medicine

## 2018-09-22 MED ORDER — TIOTROPIUM BROMIDE MONOHYDRATE 1.25 MCG/ACT IN AERS: 2.0000 | INHALATION_SPRAY | Freq: Every day | RESPIRATORY_TRACT | 3 refills | Status: DC

## 2018-09-22 NOTE — Telephone Encounter (Signed)
Called & spoke w/ pt. Last seen 08/15/2018 by Geraldo Pitter, NP. Per 08/15/2018 office notes, Spiriva 1.25 2 puffs twice daily was sampled to pt and pt would let us know if it helps to send in RX. Pt states it has been therapeutic, therefore, order for Spirivia 1.25 has been placed & to be sent to pt preferred pharmacy [facility]. Verified pt's next appt is on 12/18/2018 at 11:30 AM w/ MW. Pt verbalized understanding with no additional questions. Nothing further needed at this time.

## 2018-11-17 ENCOUNTER — Ambulatory Visit: Payer: Commercial Managed Care - PPO | Admitting: Internal Medicine

## 2018-11-17 ENCOUNTER — Other Ambulatory Visit: Payer: Self-pay

## 2018-11-17 ENCOUNTER — Encounter: Payer: Self-pay | Admitting: Internal Medicine

## 2018-11-17 DIAGNOSIS — F17210 Nicotine dependence, cigarettes, uncomplicated: Secondary | ICD-10-CM | POA: Diagnosis not present

## 2018-11-17 DIAGNOSIS — J449 Chronic obstructive pulmonary disease, unspecified: Secondary | ICD-10-CM

## 2018-11-17 DIAGNOSIS — I10 Essential (primary) hypertension: Secondary | ICD-10-CM

## 2018-11-17 MED ORDER — SPIRIVA RESPIMAT 2.5 MCG/ACT IN AERS: 2.0000 | INHALATION_SPRAY | Freq: Every day | RESPIRATORY_TRACT | 0 refills | Status: DC

## 2018-11-17 MED ORDER — SPIRIVA RESPIMAT 2.5 MCG/ACT IN AERS: 2.0000 | INHALATION_SPRAY | Freq: Every day | RESPIRATORY_TRACT | 11 refills | Status: DC

## 2018-11-17 NOTE — Assessment & Plan Note (Signed)
Active smoker - alpha one AT levels 06/14/17 = 156  - Spirometry 07/16/2018  FEV1 1.4 (53%)  Ratio 0.58 typical curvature   - 07/16/2018  After extensive coaching inhaler device,  effectiveness =    75% with hfa so try symbicort 160 2bid - PFTs 08/15/2018    FEV1 1.12  (41 % ) ratio 0.48  p no % improvement from saba p symb 80 x 2  prior to study with DLCO  78/78c % corrects to 105  % for alv volume  And copd curvature on f/v loop > spiriva 1.25 x 2 added  - 11/17/2018  After extensive coaching inhaler device,  effectiveness =    75% with hfa/ 90% with smi > continue symb 80 and increase spiriva to 2.5 x 2 puffs daily     Group D in terms of symptom/risk and laba/lama/ICS  therefore appropriate rx at this point >>>  Ok on symb/spiriva but needs full dose lama and consider d/c ICS if free of aecopd x one year which is more likely if off cigs (see separate a/p)

## 2018-11-17 NOTE — Patient Instructions (Addendum)
Increase spiriva  2.5 mcg  X 2 puffs each am   Work on inhaler technique:  relax and gently blow all the way out then take a nice smooth deep breath back in, triggering the inhaler at same time you start breathing in.  Hold for up to 5 seconds if you can. Blow out symbicort  thru nose. Rinse and gargle with water when done    Good luck with the stop smoking  Keep your appt for your CT chest    Please schedule a follow up visit in 6 months but call sooner if needed

## 2018-11-17 NOTE — Assessment & Plan Note (Signed)
On ACEi chronically   Tolerating ok to date but does risk confusion in terms of interpretation of symptoms vs copd and also GERD(on the upper airway)  > no change needed for now

## 2018-11-17 NOTE — Assessment & Plan Note (Signed)
Entered into LDCT for 12/17/2018   Counseled re importance of smoking cessation but did not meet time criteria for separate billing     I had an extended discussion with the patient reviewing all relevant studies completed to date and  lasting 15 to 20 minutes of a 25 minute visit    See device teaching which extended face to face time for this visit.  Each maintenance medication was reviewed in detail including emphasizing most importantly the difference between maintenance and prns and under what circumstances the prns are to be triggered using an action plan format that is not reflected in the computer generated alphabetically organized AVS which I have not found useful in most complex patients, especially with respiratory illnesses  Please see AVS for specific instructions unique to this visit that I personally wrote and verbalized to the the pt in detail and then reviewed with pt  by my nurse highlighting any  changes in therapy recommended at today's visit to their plan of care.

## 2018-11-17 NOTE — Progress Notes (Signed)
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 Acute respiratory failure with hypoxia ([place]) . Chronic back pain . Anxiety . COPD with acute exacerbation ([place]) . Depression . GERD (gastroesophageal reflux disease) . Hyperlipidemia . Tobacco use   History of Present Illness  07/16/2018  Pulmonary/ 1st office eval/Abhay Godbolt / active smoking  Chief Complaint  Patient presents with  . Pulmonary Consult    Referred by Dr Tana Coast for eval of COPD. Pt states her breathing is "great". She stopped Breo approx 1 wk ago due to thrush. She has a rescue inhaler that she uses once every 2 wks approx.   Dyspnea:  More doe x MMRC2 = can't walk a nl pace on a flat grade s sob but does fine slow and flat Cough: minimum in am  Sleep: bed is flat with big pillow SABA use: back to baseline / rarely need  rec The key is to stop smoking completely before smoking completely stops you! - For smoking cessation classes call [phone]   Plan A = Automatic = Symbicort 80 Take 2 puffs first thing in am and then another 2 puffs about 12 hours later.  Work on inhaler technique:   Plan B = Backup Only use your albuterol inhaler(proair) as a rescue medication  Please schedule a follow up office visit in 4 weeks, sooner if needed with PFTs on return     08/15/2018 Referral: Lung cancer screening clinic Medications: Add Spiriva 1.25- take 2 puffs once daily (sample given, if helps let us know will send in RX) Continue Symbicort - take 2 puffs twice daily Back up - Albuterol rescue inhaler as  needed every 4 hours for shortness of breath/wheezing  Follow-up: 3-4 months with Dr. Melvyn Novas or sooner if breathing worsening  Due for Pneumococcal 23 vaccine at next visits (d/t smoking hx and age)    11/17/2018  f/u ov/Valerie Key re: COPD  3 on symb 80 and spiriva 1.25 x 2  Chief Complaint  Patient presents with  . Follow-up    Breathing is doing well and no new co's. She is using her albuterol inhaler once per month on average.   Dyspnea:  MMRC2 = can't walk a nl pace on a flat grade s sob but does fine slow and flat eg Cough: minimal am  Sleeping: bed flat/ 2 pillows  Due to reflux  SABA use: rarely 02: none    No obvious day to day or daytime variability or assoc excess/ purulent sputum or mucus plugs or hemoptysis or cp or chest tightness, subjective wheeze or overt sinus   symptoms.   sleepin  without nocturnal  or early am exacerbation  of respiratory  c/o's or need for noct saba. Also denies any obvious fluctuation of symptoms with weather or environmental changes or other aggravating or alleviating factors except as outlined above   No unusual exposure hx or h/o childhood pna/ asthma or knowledge of premature birth.  Current Allergies, Complete Past Medical History, Past Surgical History, Family History, and Social History were reviewed in  [facility] electronic medical record.  ROS  The following are not active complaints unless bolded Hoarseness, sore throat, dysphagia, dental problems, itching, sneezing,  nasal congestion or discharge of excess mucus or purulent secretions, ear ache,   fever, chills, sweats, unintended wt loss or wt gain, classically pleuritic or exertional cp,  orthopnea pnd or arm/hand swelling  or leg swelling, presyncope, palpitations, abdominal pain, anorexia, nausea, vomiting, diarrhea  or change in bowel habits or change in bladder habits, change in stools or change in urine, dysuria, hematuria,  rash, arthralgias, visual complaints, headache, numbness,  weakness or ataxia or problems with walking or coordination,  change in mood or  memory.        Current Meds  Medication Sig  . ALPRAZolam (XANAX) 0.5 MG tablet Take 0.25 mg by mouth daily as needed for anxiety.   . budesonide-formoterol (SYMBICORT) 80-4.5 MCG/ACT inhaler Inhale 2 puffs into the lungs 2 (two) times daily.  . citalopram (CELEXA) 20 MG tablet Take 20 mg by mouth at bedtime.   Marland Kitchen HYDROcodone-acetaminophen (NORCO) 10-325 MG tablet Take 1 tablet by mouth every 4 (four) hours as needed.  Marland Kitchen lisinopril (PRINIVIL,ZESTRIL) 10 MG tablet Take 10 mg by mouth daily.  Marland Kitchen loratadine (CLARITIN) 10 MG tablet Take 10 mg by mouth daily.  . nicotine (NICODERM CQ - DOSED IN MG/24 HOURS) 21 mg/24hr patch Place 1 patch (21 mg total) onto the skin daily.  . pantoprazole (PROTONIX) 40 MG tablet TAKE 1 TAB WITH BREAKFAST EVERY MORNING. (Patient taking differently: Take 40 mg by mouth daily. )  . PROAIR HFA 108 (90 Base) MCG/ACT inhaler Inhale 1 puff into the lungs every 4 (four) hours as needed for wheezing or shortness of breath.  . rosuvastatin (CRESTOR) 10 MG tablet Take 10 mg by mouth every evening.  . Tiotropium Bromide Monohydrate (SPIRIVA RESPIMAT) 1.25 MCG/ACT AERS Inhale 2 puffs into the lungs daily.  . vitamin E (VITAMIN E) 400 UNIT capsule Take 2 capsules (800 Units total) by mouth daily.              Objective:     amb pleasant wf nad   Wt Readings from Last 3 Encounters:  11/17/18 154 lb 3.2 oz (69.9 kg)  08/15/18 153 lb (69.4 kg)  07/16/18 152 lb 9.6 oz (69.2 kg)     Vital signs reviewed - Note on arrival 02 sats  96% on RA    HEENT: Edentulous / nl oropharynx. Nl external ear canals without cough reflex -  Mild bilateral non-specific turbinate edema     NECK :  without JVD/Nodes/TM/ nl carotid upstrokes bilaterally   LUNGS: no acc muscle use,  Mod barrel  contour chest wall with bilateral  Distant bs s audible wheeze and  without cough on insp or exp maneuver and mod   Hyperresonant  to  percussion bilaterally     CV:  RRR  no s3 or murmur or increase in P2, and no edema   ABD:  soft and nontender with pos mid  insp Hoover's  in the supine position. No bruits or organomegaly appreciated, bowel sounds nl  MS:   Nl gait/  ext warm without deformities, calf tenderness, cyanosis or clubbing No obvious joint restrictions   SKIN: warm and dry without lesions    NEURO:  alert, approp, nl sensorium with  no motor or cerebellar deficits apparent.                Assessment

## 2018-12-16 ENCOUNTER — Telehealth: Payer: Self-pay | Admitting: Acute Care

## 2018-12-17 ENCOUNTER — Inpatient Hospital Stay: Admission: RE | Admit: 2018-12-17 | Payer: Self-pay | Source: Ambulatory Visit

## 2018-12-17 ENCOUNTER — Encounter: Payer: Commercial Managed Care - PPO | Admitting: Acute Care

## 2018-12-17 NOTE — Telephone Encounter (Signed)
Pt's shared decision visit was cancelled. Nothing further needed.

## 2018-12-18 ENCOUNTER — Ambulatory Visit: Payer: Commercial Managed Care - PPO | Admitting: Internal Medicine

## 2019-01-12 ENCOUNTER — Encounter: Payer: Commercial Managed Care - PPO | Admitting: Acute Care

## 2019-01-21 ENCOUNTER — Other Ambulatory Visit: Payer: Self-pay

## 2019-01-21 ENCOUNTER — Ambulatory Visit (INDEPENDENT_AMBULATORY_CARE_PROVIDER_SITE_OTHER)
Admission: RE | Admit: 2019-01-21 | Discharge: 2019-01-21 | Disposition: A | Discharge status: Home / Self Care | Payer: Commercial Managed Care - PPO | Source: Ambulatory Visit | Attending: Acute Care | Admitting: Acute Care

## 2019-01-21 ENCOUNTER — Encounter: Payer: Self-pay | Admitting: Acute Care

## 2019-01-21 ENCOUNTER — Ambulatory Visit (INDEPENDENT_AMBULATORY_CARE_PROVIDER_SITE_OTHER): Payer: Commercial Managed Care - PPO | Admitting: Acute Care

## 2019-01-21 VITALS — BP 124/74 | HR 116 | Temp 98.9°F | Ht 64.0 in | Wt 154.0 lb

## 2019-01-21 DIAGNOSIS — F17210 Nicotine dependence, cigarettes, uncomplicated: Secondary | ICD-10-CM | POA: Diagnosis not present

## 2019-01-21 DIAGNOSIS — Z122 Encounter for screening for malignant neoplasm of respiratory organs: Secondary | ICD-10-CM

## 2019-01-21 NOTE — Progress Notes (Signed)
Shared Decision Making Visit Lung Cancer Screening Program [phone])   Eligibility:  Age 58 y.o.  Pack Years Smoking History Calculation 54 pack year smoking history (# packs/per year x # years smoked)  Recent History of coughing up blood  no  Unexplained weight loss? no ( >Than 15 pounds within the last 6 months )  Prior History Lung / other cancer no (Diagnosis within the last 5 years already requiring surveillance chest CT Scans).  Smoking Status Current Smoker  Former Smokers: Years since quit: NA  Quit Date: NA  Visit Components:  Discussion included one or more decision making aids. yes  Discussion included risk/benefits of screening. yes  Discussion included potential follow up diagnostic testing for abnormal scans. yes  Discussion included meaning and risk of over diagnosis. yes  Discussion included meaning and risk of False Positives. yes  Discussion included meaning of total radiation exposure. yes  Counseling Included:  Importance of adherence to annual lung cancer LDCT screening. yes  Impact of comorbidities on ability to participate in the program. yes  Ability and willingness to under diagnostic treatment. yes  Smoking Cessation Counseling:  Current Smokers:   Discussed importance of smoking cessation. yes  Information about tobacco cessation classes and interventions provided to patient. yes  Patient provided with "ticket" for LDCT Scan. yes  Symptomatic Patient. no  Counseling   Diagnosis Code: Tobacco Use Z72.0  Asymptomatic Patient yes  Counseling (Intermediate counseling: > three minutes counseling) [id number]  Former Smokers:   Discussed the importance of maintaining cigarette abstinence. yes  Diagnosis Code: Personal History of Nicotine Dependence. K16.010  Information about tobacco cessation classes and interventions provided to patient. Yes  Patient provided with "ticket" for LDCT Scan. yes  Written Order for Lung Cancer  Screening with LDCT placed in Epic. Yes (CT Chest Lung Cancer Screening Low Dose W/O CM) [id number] Z12.2-Screening of respiratory organs Z87.891-Personal history of nicotine dependence  BP 124/74 (BP Location: Left Arm, Cuff Size: Normal)   Pulse (!) 116   Temp 98.9 F (37.2 C) (Oral)   Ht 5\' 4"  (1.626 m)   Wt 154 lb (69.9 kg)   SpO2 94%   BMI 26.43 kg/m    I have spent 25 minutes of face to face time with Valerie Key discussing the risks and benefits of lung cancer screening. We viewed a power point together that explained in detail the above noted topics. We paused at intervals to allow for questions to be asked and answered to ensure understanding.We discussed that the single most powerful action that she can take to decrease her risk of developing lung cancer is to quit smoking. We discussed whether or not she is ready to commit to setting a quit date. We discussed options for tools to aid in quitting smoking including nicotine replacement therapy, non-nicotine medications, support groups, Quit Smart classes, and behavior modification. We discussed that often times setting smaller, more achievable goals, such as eliminating 1 cigarette a day for a week and then 2 cigarettes a day for a week can be helpful in slowly decreasing the number of cigarettes smoked. This allows for a sense of accomplishment as well as providing a clinical benefit. I gave her the " Be Stronger Than Your Excuses" card with contact information for community resources, classes, free nicotine replacement therapy, and access to mobile apps, text messaging, and on-line smoking cessation help. I have also given her my card and contact information in the event she needs to contact me. We discussed the  time and location of the scan, and that either Valerie Glassman RN or I will call with the results within 24-48 hours of receiving them. I have offered her  a copy of the power point we viewed  as a resource in the event they need  reinforcement of the concepts we discussed today in the office. The patient verbalized understanding of all of  the above and had no further questions upon leaving the office. They have my contact information in the event they have any further questions.  I spent 3 minutes counseling on smoking cessation and the health risks of continued tobacco abuse.  I explained to the patient that there has been a high incidence of coronary artery disease noted on these exams. I explained that this is a non-gated exam therefore degree or severity cannot be determined. This patient is currently on statin therapy. I have asked the patient to follow-up with their PCP regarding any incidental finding of coronary artery disease and management with diet or medication as their PCP  feels is clinically indicated. The patient verbalized understanding of the above and had no further questions upon completion of the visit.      Magdalen Spatz, NP 01/21/2019 9:28 AM

## 2019-01-21 NOTE — Patient Instructions (Signed)
Thank you for participating in the [facility] Lung Cancer Screening Program. It was our pleasure to meet you today. We will call you with the results of your scan within the next few days. Your scan will be assigned a Lung RADS category score by the physicians reading the scans.  This Lung RADS score determines follow up scanning.  See below for description of categories, and follow up screening recommendations. We will be in touch to schedule your follow up screening annually or based on recommendations of our providers. We will fax a copy of your scan results to your Primary Care Physician, or the physician who referred you to the program, to ensure they have the results. Please call the office if you have any questions or concerns regarding your scanning experience or results.  Our office number is [phone]. Please speak with Denise Phelps, RN. She is our Lung Cancer Screening RN. If she is unavailable when you call, please have the office staff send her a message. She will return your call at her earliest convenience. Remember, if your scan is normal, we will scan you annually as long as you continue to meet the criteria for the program. (Age 55-77, Current smoker or smoker who has quit within the last 15 years). If you are a smoker, remember, quitting is the single most powerful action that you can take to decrease your risk of lung cancer and other pulmonary, breathing related problems. We know quitting is hard, and we are here to help.  Please let us know if there is anything we can do to help you meet your goal of quitting. If you are a former smoker, congratulations. We are proud of you! Remain smoke free! Remember you can refer friends or family members through the number above.  We will screen them to make sure they meet criteria for the program. Thank you for helping us take better care of you by participating in Lung Screening.  Lung RADS Categories:  Lung RADS 1: no nodules  or definitely non-concerning nodules.  Recommendation is for a repeat annual scan in 12 months.  Lung RADS 2:  nodules that are non-concerning in appearance and behavior with a very low likelihood of becoming an active cancer. Recommendation is for a repeat annual scan in 12 months.  Lung RADS 3: nodules that are probably non-concerning , includes nodules with a low likelihood of becoming an active cancer.  Recommendation is for a 6-month repeat screening scan. Often noted after an upper respiratory illness. We will be in touch to make sure you have no questions, and to schedule your 6-month scan.  Lung RADS 4 A: nodules with concerning findings, recommendation is most often for a follow up scan in 3 months or additional testing based on our provider's assessment of the scan. We will be in touch to make sure you have no questions and to schedule the recommended 3 month follow up scan.  Lung RADS 4 B:  indicates findings that are concerning. We will be in touch with you to schedule additional diagnostic testing based on our provider's  assessment of the scan.   

## 2019-01-23 ENCOUNTER — Other Ambulatory Visit: Payer: Self-pay | Admitting: *Deleted

## 2019-01-23 DIAGNOSIS — Z122 Encounter for screening for malignant neoplasm of respiratory organs: Secondary | ICD-10-CM

## 2019-01-23 DIAGNOSIS — F17210 Nicotine dependence, cigarettes, uncomplicated: Secondary | ICD-10-CM

## 2019-05-21 ENCOUNTER — Ambulatory Visit: Payer: Commercial Managed Care - PPO | Admitting: Internal Medicine

## 2019-06-08 ENCOUNTER — Encounter: Payer: Self-pay | Admitting: Internal Medicine

## 2019-06-08 ENCOUNTER — Other Ambulatory Visit: Payer: Self-pay

## 2019-06-08 ENCOUNTER — Ambulatory Visit: Payer: Commercial Managed Care - PPO | Admitting: Internal Medicine

## 2019-06-08 DIAGNOSIS — F17210 Nicotine dependence, cigarettes, uncomplicated: Secondary | ICD-10-CM

## 2019-06-08 DIAGNOSIS — J449 Chronic obstructive pulmonary disease, unspecified: Secondary | ICD-10-CM | POA: Diagnosis not present

## 2019-06-08 DIAGNOSIS — I10 Essential (primary) hypertension: Secondary | ICD-10-CM

## 2019-06-08 MED ORDER — SPIRIVA RESPIMAT 2.5 MCG/ACT IN AERS: 2.0000 | INHALATION_SPRAY | Freq: Every day | RESPIRATORY_TRACT | 0 refills | Status: DC

## 2019-06-08 MED ORDER — BUDESONIDE-FORMOTEROL FUMARATE 160-4.5 MCG/ACT IN AERO: INHALATION_SPRAY | RESPIRATORY_TRACT | 12 refills | Status: DC

## 2019-06-08 NOTE — Patient Instructions (Addendum)
Plan A = Automatic = Always=    symbicort increase to  160 Take 2 puffs first thing in am and then another 2 puffs about 12 hours later and spiriva 2.5 x 2 puffs first thing in am  Plan B = Backup (to supplement plan A, not to replace it) Only use your albuterol inhaler as a rescue medication to be used if you can't catch your breath by resting or doing a relaxed purse lip breathing pattern.  - The less you use it, the better it will work when you need it. - Ok to use the inhaler up to 2 puffs  every 4 hours if you must but call for appointment if use goes up over your usual need - Don't leave home without it !!  (think of it like the spare tire for your car)   The key is to stop smoking completely before smoking completely stops you!  For smoking cessation information call [phone]   Please remember to go to the lab department   for your tests - we will call you with the results when they are available.     Please schedule a follow up visit in 6 months but call sooner if needed

## 2019-06-08 NOTE — Progress Notes (Signed)
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 Acute respiratory failure with hypoxia ([place]) . Chronic back pain . Anxiety . COPD with acute exacerbation ([place]) . Depression . GERD (gastroesophageal reflux disease) . Hyperlipidemia . Tobacco use   History of Present Illness  07/16/2018  Pulmonary/ 1st office eval/Kandice Schmelter / active smoking  Chief Complaint  Patient presents with  . Pulmonary Consult    Referred by Dr Tana Coast for eval of COPD. Pt states her breathing is "great". She stopped Breo approx 1 wk ago due to thrush. She has a rescue inhaler that she uses once every 2 wks approx.   Dyspnea:  More doe x MMRC2 = can't walk a nl pace on a flat grade s sob but does fine slow and flat Cough: minimum in am  Sleep: bed is flat with big pillow SABA use: back to baseline / rarely need  rec The key is to stop smoking completely before smoking completely stops you! - For smoking cessation classes call [phone]   Plan A = Automatic = Symbicort 80 Take 2 puffs first thing in am and then another 2 puffs about 12 hours later.  Work on inhaler technique:   Plan B = Backup Only use your albuterol inhaler(proair) as a rescue medication  Please schedule a follow up office visit in 4 weeks, sooner if needed with PFTs on return     08/15/2018 Referral: Lung cancer screening clinic Medications: Add Spiriva 1.25- take 2 puffs once daily (sample given, if helps let us know will send in RX) Continue Symbicort - take 2 puffs twice daily Back up - Albuterol rescue inhaler as  needed every 4 hours for shortness of breath/wheezing  Follow-up: 3-4 months with Dr. Melvyn Novas or sooner if breathing worsening  Due for Pneumococcal 23 vaccine at next visits (d/t smoking hx and age)    11/17/2018  f/u ov/Mc Hollen re: COPD  3 on symb 80 and spiriva 1.25 x 2  Chief Complaint  Patient presents with  . Follow-up    Breathing is doing well and no new co's. She is using her albuterol inhaler once per month on average.   Dyspnea:  MMRC2 = can't walk a nl pace on a flat grade s sob but does fine slow and flat eg Cough: minimal am  Sleeping: bed flat/ 2 pillows  Due to reflux  SABA use: rarely 02: none  rec Increase spiriva  2.5 mcg  X 2 puffs each am  Work on inhaler technique:   Good luck with the stop smoking   06/08/2019  f/u ov/Aurie Harroun re:  GOLD 3  / still smoking on symb 160/spiriva 2.5  Chief Complaint  Patient presents with  . Follow-up    COPD, no symptoms  Dyspnea:  Varies a lot week to week, typically stops after a mile on a bad day and finish the mile on good day Cough: denies but did cough occ  during exam/interview Sleeping: ok on side 2 pillows flat bed  SABA use: once every few weeks 02: none  Overt hb despite ppi  No obvious day to day or daytime variability or assoc excess/ purulent sputum or mucus plugs or hemoptysis or cp or chest tightness, subjective wheeze or overt sinus or hb symptoms.   Sleeping as above without nocturnal  or early am exacerbation  of respiratory  c/o's or need for noct saba. Also denies any obvious fluctuation of symptoms with weather or environmental changes or other aggravating or alleviating factors except as outlined above   No unusual exposure hx or h/o childhood pna/ asthma or knowledge of premature birth.  Current Allergies, Complete Past Medical History, Past Surgical History, Family History, and Social History were reviewed in [facility] record.  ROS  The following are not active complaints unless  bolded Hoarseness, sore throat, dysphagia, dental problems, itching, sneezing,  nasal congestion or discharge of excess mucus or purulent secretions, ear ache,   fever, chills, sweats, unintended wt loss or wt gain, classically pleuritic or exertional cp,  orthopnea pnd or arm/hand swelling  or leg swelling, presyncope, palpitations, abdominal pain, anorexia, nausea, vomiting, diarrhea  or change in bowel habits or change in bladder habits, change in stools or change in urine, dysuria, hematuria,  rash, arthralgias, visual complaints, headache, numbness, weakness or ataxia or problems with walking or coordination,  change in mood or  memory.        Current Meds  Medication Sig  . ALPRAZolam (XANAX) 0.5 MG tablet Take 0.25 mg by mouth daily as needed for anxiety.   . budesonide-formoterol (SYMBICORT) 80-4.5 MCG/ACT inhaler Inhale 2 puffs into the lungs 2 (two) times daily.  . citalopram (CELEXA) 20 MG tablet Take 20 mg by mouth at bedtime.   Marland Kitchen HYDROcodone-acetaminophen (NORCO) 10-325 MG tablet Take 1 tablet by mouth every 4 (four) hours as needed.  Marland Kitchen lisinopril (PRINIVIL,ZESTRIL) 10 MG tablet Take 10 mg by mouth daily.  Marland Kitchen loratadine (CLARITIN) 10 MG tablet Take 10 mg by mouth daily.  . nicotine (NICODERM CQ - DOSED IN MG/24 HOURS) 21 mg/24hr patch Place 1 patch (21 mg total) onto the skin daily.  . pantoprazole (PROTONIX) 40 MG tablet TAKE 1 TAB WITH BREAKFAST EVERY MORNING. (Patient taking differently: Take 40 mg by mouth daily. )  . PROAIR HFA 108 (90 Base) MCG/ACT inhaler Inhale 1 puff into the lungs every 4 (four) hours as needed for wheezing or shortness of breath.  . rosuvastatin (CRESTOR) 10 MG tablet Take 10 mg by mouth every evening.  . vitamin E (VITAMIN E) 400 UNIT capsule Take 2 capsules (800 Units total) by mouth daily.                     Objective:     amb wf with classic smoker's rattle on spont coughing   06/08/2019          155   11/17/18 154 lb 3.2 oz (69.9 kg)    08/15/18 153 lb (69.4 kg)  07/16/18 152 lb 9.6 oz (69.2 kg)      Vital signs reviewed - Note on arrival 02 sats  96% on RA      Reports Edentulous  HEENT : pt wearing mask not removed for exam due to covid -19 concerns.    NECK :  without JVD/Nodes/TM/ nl carotid upstrokes bilaterally   LUNGS: no acc muscle use,  Mod barrel  contour chest wall with bilateral  Distant bs s audible wheeze and  without cough on insp or exp maneuvers and mod  Hyperresonant  to  percussion bilaterally     CV:  RRR  no s3 or murmur or increase in P2, and no edema   ABD:  soft and nontender with pos mid insp Hoover's  in the supine position. No bruits or organomegaly appreciated, bowel sounds nl  MS:     ext warm without deformities, calf tenderness, cyanosis or clubbing No obvious joint restrictions   SKIN: warm and dry without lesions    NEURO:  alert, approp, nl sensorium with  no motor or cerebellar deficits apparent.           Labs ordered 06/08/2019  :     alpha one AT phenotype       Assessment

## 2019-06-09 ENCOUNTER — Encounter: Payer: Self-pay | Admitting: Internal Medicine

## 2019-06-09 NOTE — Assessment & Plan Note (Addendum)
4-5 min discussion re active cigarette smoking in addition to office E&M  Ask about tobacco use:   Ongoing  Advise quitting   I took an extended  opportunity with this patient to outline the consequences of continued cigarette use  in airway disorders based on all the data we have from the multiple national lung health studies (perfomed over decades at millions of dollars in cost)  indicating that smoking cessation, not choice of inhalers or physicians, is the most important aspect of her care.   Assess willingness:  Not committed at this point to completely quit but is using nicotine patches intermittently > encouraged Assist in quit attempt:  Per PCP when ready Arrange follow up:   Follow up per Primary Care planned

## 2019-06-09 NOTE — Assessment & Plan Note (Addendum)
The cough (which she denies) and variable doe are indistinguishable from copd/CB clinically.  In the best review of chronic cough to date ( NEJM 2016 375 [phone]) ,  ACEi are now felt to cause cough in up to  20% of pts which is a 4 fold increase from previous reports and does not include the variety of non-specific complaints we see in pulmonary clinic in pts on ACEi but previously attributed to another dx like  Copd/asthma and  include PNDS, throat and chest congestion, "bronchitis", unexplained dyspnea and noct "strangling" sensations, and hoarseness, but also  atypical /refractory GERD symptoms like dysphagia and "bad heartburn" (even on ppi, which may be the case here)   The only way I know  to prove this is not an "ACEi Case" is a trial off ACEi x a minimum of 6 weeks then regroup> she wishes to defer this issue to PCP.

## 2019-06-09 NOTE — Assessment & Plan Note (Signed)
Active smoker - alpha one AT levels 06/14/17 = 156  - Spirometry 07/16/2018  FEV1 1.4 (53%)  Ratio 0.58 typical curvature   - 07/16/2018  After extensive coaching inhaler device,  effectiveness =    75% with hfa so try symbicort 160 2bid - PFTs 08/15/2018    FEV1 1.12  (41 % ) ratio 0.48  p no % improvement from saba p symb 80 x 2  prior to study with DLCO  78/78c % corrects to 105  % for alv volume  And copd curvature on f/v loop > spiriva 1.25 x 2 added  - 11/17/2018   > continue symb 80 and increase spiriva to 2.5 x 2 puffs daily  - 06/08/2019  After extensive coaching inhaler device,  effectiveness =    90% increase symb to 160 2bid  - Alpha one Phenotype 06/08/2019 >>>    Group D in terms of symptom/risk and laba/lama/ICS  therefore appropriate rx at this point >>>  Continue symb / spiriva and prn saba/ work on stop smoking (see separate a/p)    I had an extended discussion with the patient,  reviewed all relevant studies completed to date and I  performed detailed device teaching using a teach back method which extended face to face time for this visit = 30 min total   Each maintenance medication was reviewed in detail including emphasizing most importantly the difference between maintenance and prns and under what circumstances the prns are to be triggered using an action plan format that is not reflected in the computer generated alphabetically organized AVS which I have not found useful in most complex patients, especially with respiratory illnesses  Please see AVS for specific instructions unique to this visit that I personally wrote and verbalized to the the pt in detail and then reviewed with pt  by my nurse highlighting any  changes in therapy recommended at today's visit to their plan of care.

## 2019-06-13 LAB — ALPHA-1 ANTITRYPSIN PHENOTYPE: A-1 Antitrypsin, Ser: 143 mg/dL (ref 83–199)

## 2019-09-02 ENCOUNTER — Telehealth: Payer: Self-pay | Admitting: Internal Medicine

## 2019-09-02 NOTE — Telephone Encounter (Signed)
Pt needs rf for pantoprazole sent to [facility] on [address]. She just made an appt to see Amy on 4/19 at 8:30am.

## 2019-09-09 ENCOUNTER — Other Ambulatory Visit: Payer: Self-pay | Admitting: Physical Medicine and Rehabilitation

## 2019-09-09 ENCOUNTER — Ambulatory Visit
Admission: RE | Admit: 2019-09-09 | Discharge: 2019-09-09 | Disposition: A | Discharge status: Home / Self Care | Payer: Commercial Managed Care - PPO | Source: Ambulatory Visit | Attending: Physical Medicine and Rehabilitation | Admitting: Physical Medicine and Rehabilitation

## 2019-09-09 DIAGNOSIS — M25559 Pain in unspecified hip: Secondary | ICD-10-CM

## 2019-09-21 ENCOUNTER — Encounter: Payer: Self-pay | Admitting: Physician Assistant

## 2019-09-21 ENCOUNTER — Other Ambulatory Visit: Payer: Self-pay

## 2019-09-21 ENCOUNTER — Ambulatory Visit: Payer: Commercial Managed Care - PPO | Admitting: Physician Assistant

## 2019-09-21 VITALS — BP 128/72 | HR 103 | Temp 96.7°F | Ht 64.0 in | Wt 158.1 lb

## 2019-09-21 DIAGNOSIS — K219 Gastro-esophageal reflux disease without esophagitis: Secondary | ICD-10-CM

## 2019-09-21 DIAGNOSIS — K760 Fatty (change of) liver, not elsewhere classified: Secondary | ICD-10-CM | POA: Diagnosis not present

## 2019-09-21 MED ORDER — PANTOPRAZOLE SODIUM 40 MG PO TBEC: 40.0000 mg | DELAYED_RELEASE_TABLET | Freq: Every day | ORAL | 11 refills | Status: DC

## 2019-09-21 NOTE — Patient Instructions (Addendum)
If you are age 57 or older, your body mass index should be between 23-30. Your Body mass index is 27.14 kg/m. If this is out of the aforementioned range listed, please consider follow up with your Primary Care Provider.  If you are age 3 or younger, your body mass index should be between 19-25. Your Body mass index is 27.14 kg/m. If this is out of the aformentioned range listed, please consider follow up with your Primary Care Provider.   We have sent the following medications to your pharmacy for you to pick up at your convenience: Pantoprazole 40 mg daily.   Continue Vitamin E 800 IU daily.   Follow up in one year or sooner if needed.

## 2019-09-21 NOTE — Progress Notes (Signed)
Addendum: Reviewed and agree with assessment and management plan. Adelaine Roppolo M, MD  

## 2019-09-21 NOTE — Progress Notes (Signed)
Subjective:    Patient ID: Valerie Key, female    DOB: 10-Jun-1960, 59 y.o.   MRN: [id number]  HPI  Valerie Key is a pleasant 59 year old white female, established with Dr. Hilarie Key, who comes in today for med refills.  She has history of chronic GERD and has been maintained on Protonix 40 mg p.o. daily with good control of symptoms. She has no complaints of dysphagia or odynophagia. No current complaints of abdominal pain, changes in bowel habits. She has history of fatty liver with prior work-up showing F2 fibrosis.  She remains on vitamin D 800 IUs daily. She last had colonoscopy in January 2019 with finding of a 5 mm rectal polyp which by biopsy was an inflammatory polyp and also noted to have sigmoid diverticulosis.  Review of Systems Pertinent positive and negative review of systems were noted in the above HPI section.  All other review of systems was otherwise negative.  Outpatient Encounter Medications as of 09/21/2019  Medication Sig  . ALPRAZolam (XANAX) 0.5 MG tablet Take 0.25 mg by mouth daily as needed for anxiety.   . budesonide-formoterol (SYMBICORT) 160-4.5 MCG/ACT inhaler Take 2 puffs first thing in am and then another 2 puffs about 12 hours later.  . Cholecalciferol (VITAMIN D3) 125 MCG (5000 UT) CAPS Take 1 capsule by mouth daily.  . citalopram (CELEXA) 20 MG tablet Take 20 mg by mouth at bedtime.   . Coenzyme Q10 10 MG capsule Take 10 mg by mouth daily.  Marland Kitchen HYDROcodone-acetaminophen (NORCO) 10-325 MG tablet Take 1 tablet by mouth every 4 (four) hours as needed.  . loratadine (CLARITIN) 10 MG tablet Take 10 mg by mouth daily.  Marland Kitchen losartan (COZAAR) 50 MG tablet Take 50 mg by mouth daily.  . pantoprazole (PROTONIX) 40 MG tablet Take 1 tablet (40 mg total) by mouth daily.  Marland Kitchen PROAIR HFA 108 (90 Base) MCG/ACT inhaler Inhale 1 puff into the lungs every 4 (four) hours as needed for wheezing or shortness of breath.  . rosuvastatin (CRESTOR) 10 MG tablet Take 10 mg by mouth  every evening.  . Tiotropium Bromide Monohydrate (SPIRIVA RESPIMAT) 2.5 MCG/ACT AERS Inhale 2 puffs into the lungs daily. (Patient taking differently: Inhale 2 puffs into the lungs every morning. )  . vitamin E (VITAMIN E) 400 UNIT capsule Take 2 capsules (800 Units total) by mouth daily.  . [DISCONTINUED] lisinopril (PRINIVIL,ZESTRIL) 10 MG tablet Take 10 mg by mouth daily.  . [DISCONTINUED] pantoprazole (PROTONIX) 40 MG tablet TAKE 1 TAB WITH BREAKFAST EVERY MORNING. (Patient taking differently: Take 40 mg by mouth daily. )  . nicotine (NICODERM CQ - DOSED IN MG/24 HOURS) 21 mg/24hr patch Place 1 patch (21 mg total) onto the skin daily. (Patient not taking: Reported on 09/21/2019)   No facility-administered encounter medications on file as of 09/21/2019.   Allergies  Allergen Reactions  . Sulfa Antibiotics Rash and Hives   Patient Active Problem List   Diagnosis Date Noted  . Essential hypertension 07/17/2018  . COPD GOLD 2  07/16/2018  . Acute respiratory failure with hypoxia ([place]) 06/28/2018  . Anxiety 06/28/2018  . COPD with acute exacerbation ([place]) 06/28/2018  . Depression 06/28/2018  . GERD (gastroesophageal reflux disease) 06/28/2018  . Hyperlipidemia 06/28/2018  . Cigarette smoker 06/28/2018  . Dyspepsia 02/12/2012  . Degenerative disc disease 01/02/2012  . Chronic back pain 01/02/2012   Social History   Socioeconomic History  . Marital status: Legally Separated    Spouse name: Not on file  .  Number of children: 2  . Years of education: Not on file  . Highest education level: Not on file  Occupational History  . Occupation: Armed forces operational officer: [employer]  Tobacco Use  . Smoking status: Current Every Day Smoker    Packs/day: 0.50    Years: 39.00    Pack years: 19.50    Types: Cigarettes  . Smokeless tobacco: Never Used  . Tobacco comment: 10 cigarettes/day. Has Chantix  Substance and Sexual Activity  . Alcohol use: Yes    Comment: 1-2 daily  .  Drug use: No  . Sexual activity: Yes    Birth control/protection: Surgical  Other Topics Concern  . Not on file  Social History Narrative  . Not on file   Social Determinants of Health   Financial Resource Strain:   . Difficulty of Paying Living Expenses:   Food Insecurity:   . Worried About Charity fundraiser in the Last Year:   . Arboriculturist in the Last Year:   Transportation Needs:   . Film/video editor (Medical):   Marland Kitchen Lack of Transportation (Non-Medical):   Physical Activity:   . Days of Exercise per Week:   . Minutes of Exercise per Session:   Stress:   . Feeling of Stress :   Social Connections:   . Frequency of Communication with Friends and Family:   . Frequency of Social Gatherings with Friends and Family:   . Attends Religious Services:   . Active Member of Clubs or Organizations:   . Attends Archivist Meetings:   Marland Kitchen Marital Status:   Intimate Partner Violence:   . Fear of Current or Ex-Partner:   . Emotionally Abused:   Marland Kitchen Physically Abused:   . Sexually Abused:     Valerie Key family history includes Colon cancer in her paternal uncle; Diabetes in her father; Heart disease in her maternal grandfather and paternal grandfather.      Objective:    Vitals:   09/21/19 0820  BP: 128/72  Pulse: (!) 103  Temp: (!) 96.7 F (35.9 C)    Physical Exam Well-developed well-nourished female  in no acute distress.  Height, Weight,158 BMI 27.14  HEENT; nontraumatic normocephalic, EOMI, PER R LA, sclera anicteric.  Neuro/Psych; alert and oriented x4, grossly nonfocal mood and affect appropriate       Assessment & Plan:   #15 59 year old white female with chronic GERD, doing well on pantoprazole 40 mg p.o. every morning.  #2 fatty liver-continues on vitamin E 800 IUs daily, follow-up ultrasound next year #3 colon cancer screening-up-to-date, last colonoscopy January 2019 with one inflammatory rectal polyp and sigmoid diverticulosis.  Plan;  refill pantoprazole 40 mg p.o. every morning #30 and 11 refills Continue antireflux regimen Continue vitamin E 800 IUs daily Follow-up in 1 year with Dr. Hilarie Key, happy to see sooner in the interim as needed   Fortune Brannigan Genia Harold PA-C 09/21/2019   Cc: No ref. provider found

## 2019-11-25 ENCOUNTER — Other Ambulatory Visit: Payer: Self-pay | Admitting: Internal Medicine

## 2019-12-08 ENCOUNTER — Ambulatory Visit: Payer: Commercial Managed Care - PPO | Admitting: Internal Medicine

## 2019-12-30 ENCOUNTER — Telehealth: Payer: Self-pay | Admitting: Internal Medicine

## 2019-12-30 MED ORDER — BUDESONIDE-FORMOTEROL FUMARATE 160-4.5 MCG/ACT IN AERO: INHALATION_SPRAY | RESPIRATORY_TRACT | 3 refills | Status: DC

## 2019-12-30 MED ORDER — SPIRIVA RESPIMAT 2.5 MCG/ACT IN AERS: 2.0000 | INHALATION_SPRAY | Freq: Every day | RESPIRATORY_TRACT | 3 refills | Status: DC

## 2019-12-30 NOTE — Telephone Encounter (Signed)
Spoke with pt and advised that refills for Spiriva and Symbicort were sent to Pharmacy. Pt verbalized understanding. Nothing further needed.

## 2020-01-05 ENCOUNTER — Ambulatory Visit: Payer: Commercial Managed Care - PPO | Admitting: Internal Medicine

## 2020-01-18 ENCOUNTER — Encounter: Payer: Self-pay | Admitting: Acute Care

## 2020-02-04 ENCOUNTER — Ambulatory Visit: Payer: Commercial Managed Care - PPO | Admitting: Internal Medicine

## 2020-02-10 ENCOUNTER — Other Ambulatory Visit: Payer: Self-pay | Admitting: Family

## 2020-02-10 DIAGNOSIS — R600 Localized edema: Secondary | ICD-10-CM

## 2020-02-10 DIAGNOSIS — R233 Spontaneous ecchymoses: Secondary | ICD-10-CM

## 2020-02-10 DIAGNOSIS — D729 Disorder of white blood cells, unspecified: Secondary | ICD-10-CM

## 2020-02-10 DIAGNOSIS — D709 Neutropenia, unspecified: Secondary | ICD-10-CM

## 2020-02-11 ENCOUNTER — Ambulatory Visit (HOSPITAL_BASED_OUTPATIENT_CLINIC_OR_DEPARTMENT_OTHER)
Admission: RE | Admit: 2020-02-11 | Discharge: 2020-02-11 | Disposition: A | Discharge status: Home / Self Care | Payer: Commercial Managed Care - PPO | Source: Ambulatory Visit | Attending: Family Medicine | Admitting: Family Medicine

## 2020-02-11 ENCOUNTER — Other Ambulatory Visit (HOSPITAL_BASED_OUTPATIENT_CLINIC_OR_DEPARTMENT_OTHER): Payer: Self-pay | Admitting: *Deleted

## 2020-02-11 ENCOUNTER — Inpatient Hospital Stay: Payer: Commercial Managed Care - PPO | Admitting: Family

## 2020-02-11 ENCOUNTER — Other Ambulatory Visit: Payer: Self-pay

## 2020-02-11 ENCOUNTER — Encounter: Payer: Self-pay | Admitting: Family

## 2020-02-11 ENCOUNTER — Other Ambulatory Visit (HOSPITAL_BASED_OUTPATIENT_CLINIC_OR_DEPARTMENT_OTHER): Payer: Self-pay | Admitting: Family Medicine

## 2020-02-11 ENCOUNTER — Inpatient Hospital Stay: Payer: Commercial Managed Care - PPO | Attending: Hematology & Oncology

## 2020-02-11 ENCOUNTER — Other Ambulatory Visit (HOSPITAL_BASED_OUTPATIENT_CLINIC_OR_DEPARTMENT_OTHER): Payer: Self-pay | Admitting: Nurse Practitioner

## 2020-02-11 VITALS — BP 157/86 | HR 100 | Temp 98.2°F | Resp 18 | Ht 64.0 in | Wt 156.8 lb

## 2020-02-11 DIAGNOSIS — J449 Chronic obstructive pulmonary disease, unspecified: Secondary | ICD-10-CM | POA: Insufficient documentation

## 2020-02-11 DIAGNOSIS — R109 Unspecified abdominal pain: Secondary | ICD-10-CM | POA: Diagnosis not present

## 2020-02-11 DIAGNOSIS — D729 Disorder of white blood cells, unspecified: Secondary | ICD-10-CM

## 2020-02-11 DIAGNOSIS — K219 Gastro-esophageal reflux disease without esophagitis: Secondary | ICD-10-CM | POA: Diagnosis not present

## 2020-02-11 DIAGNOSIS — R600 Localized edema: Secondary | ICD-10-CM

## 2020-02-11 DIAGNOSIS — D72829 Elevated white blood cell count, unspecified: Secondary | ICD-10-CM | POA: Diagnosis present

## 2020-02-11 DIAGNOSIS — K760 Fatty (change of) liver, not elsewhere classified: Secondary | ICD-10-CM | POA: Diagnosis not present

## 2020-02-11 DIAGNOSIS — M1990 Unspecified osteoarthritis, unspecified site: Secondary | ICD-10-CM | POA: Diagnosis not present

## 2020-02-11 DIAGNOSIS — Z79899 Other long term (current) drug therapy: Secondary | ICD-10-CM | POA: Diagnosis not present

## 2020-02-11 DIAGNOSIS — Z87891 Personal history of nicotine dependence: Secondary | ICD-10-CM | POA: Diagnosis present

## 2020-02-11 DIAGNOSIS — E785 Hyperlipidemia, unspecified: Secondary | ICD-10-CM | POA: Insufficient documentation

## 2020-02-11 DIAGNOSIS — R233 Spontaneous ecchymoses: Secondary | ICD-10-CM | POA: Insufficient documentation

## 2020-02-11 DIAGNOSIS — J4489 Other specified chronic obstructive pulmonary disease: Secondary | ICD-10-CM

## 2020-02-11 DIAGNOSIS — F17210 Nicotine dependence, cigarettes, uncomplicated: Secondary | ICD-10-CM | POA: Diagnosis not present

## 2020-02-11 LAB — CBC WITH DIFFERENTIAL (CANCER CENTER ONLY)
Abs Immature Granulocytes: 0.06 10*3/uL (ref 0.00–0.07)
Basophils Absolute: 0.1 10*3/uL (ref 0.0–0.1)
Basophils Relative: 0 %
Eosinophils Absolute: 0.2 10*3/uL (ref 0.0–0.5)
Eosinophils Relative: 2 %
HCT: 43.2 % (ref 36.0–46.0)
Hemoglobin: 14 g/dL (ref 12.0–15.0)
Immature Granulocytes: 1 %
Lymphocytes Relative: 29 %
Lymphs Abs: 3.4 10*3/uL (ref 0.7–4.0)
MCH: 30.5 pg (ref 26.0–34.0)
MCHC: 32.4 g/dL (ref 30.0–36.0)
MCV: 94.1 fL (ref 80.0–100.0)
Monocytes Absolute: 0.9 10*3/uL (ref 0.1–1.0)
Monocytes Relative: 8 %
Neutro Abs: 6.9 10*3/uL (ref 1.7–7.7)
Neutrophils Relative %: 60 %
Platelet Count: 243 10*3/uL (ref 150–400)
RBC: 4.59 MIL/uL (ref 3.87–5.11)
RDW: 13.1 % (ref 11.5–15.5)
WBC Count: 11.6 10*3/uL — ABNORMAL HIGH (ref 4.0–10.5)
nRBC: 0 % (ref 0.0–0.2)

## 2020-02-11 LAB — PROTIME-INR
INR: 0.9 (ref 0.8–1.2)
Prothrombin Time: 11.6 seconds (ref 11.4–15.2)

## 2020-02-11 LAB — CMP (CANCER CENTER ONLY)
ALT: 16 U/L (ref 0–44)
AST: 18 U/L (ref 15–41)
Albumin: 4.4 g/dL (ref 3.5–5.0)
Alkaline Phosphatase: 59 U/L (ref 38–126)
Anion gap: 7 (ref 5–15)
BUN: 19 mg/dL (ref 6–20)
CO2: 35 mmol/L — ABNORMAL HIGH (ref 22–32)
Calcium: 10.2 mg/dL (ref 8.9–10.3)
Chloride: 100 mmol/L (ref 98–111)
Creatinine: 0.9 mg/dL (ref 0.44–1.00)
GFR, Est AFR Am: >60 mL/min (ref >60)
GFR, Estimated: >60 mL/min (ref >60)
Glucose, Bld: 99 mg/dL (ref 70–99)
Potassium: 3.5 mmol/L (ref 3.5–5.1)
Sodium: 142 mmol/L (ref 135–145)
Total Bilirubin: 0.3 mg/dL (ref 0.3–1.2)
Total Protein: 7.2 g/dL (ref 6.5–8.1)

## 2020-02-11 LAB — LACTATE DEHYDROGENASE: LDH: 189 U/L (ref 98–192)

## 2020-02-11 LAB — SAVE SMEAR(SSMR), FOR PROVIDER SLIDE REVIEW

## 2020-02-11 LAB — APTT: aPTT: 31 seconds (ref 24–36)

## 2020-02-11 NOTE — Progress Notes (Signed)
Hematology/Oncology Consultation   Name: Valerie Key      MRN: [id number]    Location: Room/bed info not found  Date: 02/11/2020 Time:8:48 AM   REFERRING PHYSICIAN: Joaquim Lai, MD  REASON FOR CONSULT:  Edema leg, neutrophilic leukocytosis and petechiae     DIAGNOSIS: Leukocytosis - reactive (smoker) and petechiae   HISTORY OF PRESENT ILLNESS: Valerie Key is a very pleasant 59 yo caucasian female with history of intermittent leukocytosis and most recently over the last 6 months petechiae.  She has noted little petechiae on her forearms as well as random bruises to her legs and arms. She states that the larger bruises are from bumping into things. No bruising on her back, chest or torso.  No episodes of bleeding to report.  She has history of fatty liver disease. Her last abdominal US was in 2019.  She has occasional twinges of abdominal pain under the rib cage on both the right and left.  She drinks 2 beer and night on weekdays and 3 beers a night on weekends.  She smokes 1/2 ppd. She sees Dr. Melvyn Novas with pulmonology and is on several inhalers. She has SOB at times with over exertion and will take a break to rest when needed.  No recreational drug use.  No family history of bruising, petechiae or elevated WBC count.  WBC count today is stable at 11.6, Hgb 14.0, MCV 94, platelets 243 and white cell differential is unremarkable.  PTT is normal at 31. PT/INR were also normal.  No diabetes or history of thyroid disease.  She has GERD and is taking Protonix daily. Her gastroenterologist is Dr. Billie Lade. a She has 2 children and no history of miscarriage. She carried both babies to term and delivered naturally with no complications.  Cycle was normal.  She had her gallbladder removed 3 years ago without any complications.  No fever, chills, n/v, cough, rash, dizziness, chest pain, palpitations or changes in bowel or bladder habits.  No swelling, tenderness, numbness or tingling in  her extremities at this time.  She has occasional swelling in her feet and ankles. This comes and goes. She wears compression stockings during the day for added support. Pedal pulses are 2+.  No falls or syncopal episodes to report.  She has maintained a good appetite and is staying well hydrated. Her weight is described as stable.  She works in an office. No chemical or environmental exposures.   ROS: All other 10 point review of systems is negative.   PAST MEDICAL HISTORY:   Past Medical History:  Diagnosis Date  . Allergy    SEASONAL  . Anxiety   . Arthritis   . Asthma   . Back pain   . Chronic pain syndrome   . COPD (chronic obstructive pulmonary disease) ([place])   . Depression   . Diverticulosis   . Fatty liver   . Gastritis   . GERD (gastroesophageal reflux disease)   . Hiatal hernia   . History of kidney stones   . Hyperlipidemia     ALLERGIES: Allergies  Allergen Reactions  . Sulfa Antibiotics Hives and Rash      MEDICATIONS:  Current Outpatient Medications on File Prior to Visit  Medication Sig Dispense Refill  . ALPRAZolam (XANAX) 0.5 MG tablet Take 0.25 mg by mouth daily as needed for anxiety.     . budesonide-formoterol (SYMBICORT) 160-4.5 MCG/ACT inhaler Take 2 puffs first thing in am and then another 2 puffs about 12 hours  later. 1 Inhaler 3  . Cholecalciferol (VITAMIN D3) 125 MCG (5000 UT) CAPS Take 1 capsule by mouth daily.    . citalopram (CELEXA) 20 MG tablet Take 20 mg by mouth at bedtime.   1  . Coenzyme Q10 10 MG capsule Take 10 mg by mouth daily.    . hydrochlorothiazide (HYDRODIURIL) 12.5 MG tablet Take 12.5 mg by mouth daily.    Marland Kitchen HYDROcodone-acetaminophen (NORCO) 10-325 MG tablet Take 1 tablet by mouth every 4 (four) hours as needed.    . meloxicam (MOBIC) 15 MG tablet Take 15 mg by mouth daily.    . pantoprazole (PROTONIX) 40 MG tablet Take 1 tablet (40 mg total) by mouth daily. 30 tablet 11  . PROAIR HFA 108 (90 Base) MCG/ACT inhaler Inhale 1  puff into the lungs every 4 (four) hours as needed for wheezing or shortness of breath. 1 Inhaler 3  . rosuvastatin (CRESTOR) 10 MG tablet Take 10 mg by mouth every evening.    . Tiotropium Bromide Monohydrate (SPIRIVA RESPIMAT) 2.5 MCG/ACT AERS Inhale 2 puffs into the lungs daily. 4 g 3  . vitamin E (VITAMIN E) 400 UNIT capsule Take 2 capsules (800 Units total) by mouth daily. 60 capsule 2  . nicotine (NICODERM CQ - DOSED IN MG/24 HOURS) 21 mg/24hr patch Place 1 patch (21 mg total) onto the skin daily. (Patient not taking: Reported on 09/21/2019) 28 patch 2   No current facility-administered medications on file prior to visit.     PAST SURGICAL HISTORY Past Surgical History:  Procedure Laterality Date  . ABDOMINAL HYSTERECTOMY     with bladder tact  . BLADDER TACK    . CHOLECYSTECTOMY N/A 07/26/2017   Procedure: LAPAROSCOPIC CHOLECYSTECTOMY;  Surgeon: Clovis Riley, MD;  Location: [facility];  Service: General;  Laterality: N/A;    FAMILY HISTORY: Family History  Problem Relation Age of Onset  . Diabetes Father   . Colon cancer Paternal Uncle        x 2  . Heart disease Maternal Grandfather   . Heart disease Paternal Grandfather     SOCIAL HISTORY:  reports that she has been smoking cigarettes. She has a 19.50 pack-year smoking history. She has never used smokeless tobacco. She reports current alcohol use. She reports that she does not use drugs.  PERFORMANCE STATUS: The patient's performance status is 1 - Symptomatic but completely ambulatory  PHYSICAL EXAM: Most Recent Vital Signs: Blood pressure (!) 157/86, pulse 100, temperature 98.2 F (36.8 C), temperature source Oral, resp. rate 18, height 5\' 4"  (1.626 m), weight 156 lb 12.8 oz (71.1 kg), SpO2 97 %. BP (!) 157/86 (BP Location: Left Arm, Patient Position: Sitting)   Pulse 100   Temp 98.2 F (36.8 C) (Oral)   Resp 18   Ht 5\' 4"  (1.626 m)   Wt 156 lb 12.8 oz (71.1 kg)   SpO2 97%   BMI 26.91 kg/m   General  Appearance:    Alert, cooperative, no distress, appears stated age  Head:    Normocephalic, without obvious abnormality, atraumatic  Eyes:    PERRL, conjunctiva/corneas clear, EOM's intact, fundi    benign, both eyes        Throat:   Lips, mucosa, and tongue normal; teeth and gums normal  Neck:   Supple, symmetrical, trachea midline, no adenopathy;    thyroid:  no enlargement/tenderness/nodules; no carotid   bruit or JVD  Back:     Symmetric, no curvature, ROM normal, no CVA tenderness  Lungs:     Clear to auscultation bilaterally, respirations unlabored  Chest Wall:    No tenderness or deformity   Heart:    Regular rate and rhythm, S1 and S2 normal, no murmur, rub   or gallop     Abdomen:     Soft, non-tender, bowel sounds active all four quadrants,    no masses, no organomegaly        Extremities:   Extremities normal, atraumatic, no cyanosis or edema  Pulses:   2+ and symmetric all extremities  Skin:   Skin color, texture, turgor normal, no rashes or lesions  Lymph nodes:   Cervical, supraclavicular, and axillary nodes normal  Neurologic:   CNII-XII intact, normal strength, sensation and reflexes    throughout    LABORATORY DATA:  Results for orders placed or performed in visit on 02/11/20 (from the past 48 hour(s))  CBC with Differential (Cancer Center Only)     Status: Abnormal   Collection Time: 02/11/20  8:19 AM  Result Value Ref Range   WBC Count 11.6 (H) 4.0 - 10.5 K/uL   RBC 4.59 3.87 - 5.11 MIL/uL   Hemoglobin 14.0 12.0 - 15.0 g/dL   HCT 43.2 36 - 46 %   MCV 94.1 80.0 - 100.0 fL   MCH 30.5 26.0 - 34.0 pg   MCHC 32.4 30.0 - 36.0 g/dL   RDW 13.1 11.5 - 15.5 %   Platelet Count 243 150 - 400 K/uL   nRBC 0.0 0.0 - 0.2 %   Neutrophils Relative % 60 %   Neutro Abs 6.9 1.7 - 7.7 K/uL   Lymphocytes Relative 29 %   Lymphs Abs 3.4 0.7 - 4.0 K/uL   Monocytes Relative 8 %   Monocytes Absolute 0.9 0 - 1 K/uL   Eosinophils Relative 2 %   Eosinophils Absolute 0.2 0 - 0  K/uL   Basophils Relative 0 %   Basophils Absolute 0.1 0 - 0 K/uL   Immature Granulocytes 1 %   Abs Immature Granulocytes 0.06 0.00 - 0.07 K/uL    Comment: Performed at [facility] at [facility], [address], [place], [place] [address]  Save Smear [facility])     Status: None   Collection Time: 02/11/20  8:19 AM  Result Value Ref Range   Smear Review SMEAR STAINED AND AVAILABLE FOR REVIEW     Comment: Performed at [facility] at [facility], [address], [place], [place] [address]      RADIOGRAPHY: No results found.     PATHOLOGY: None  ASSESSMENT/PLAN: Ms. Weesner is a very pleasant 59 yo caucasian female with history of intermittent leukocytosis (reactive - smoking) and most recently over the last 6 months petechiae.  Her lab work has remained stable.  With her history of fatty liver disease and alcohol use we will get an [place] to better evaluate her liver and spleen.  The Meloxicam and vitamin E that she takes daily can both cause bruising.  We will see what the US shows and plan to see her again in another 6 months.   All questions were answered and she is in agreement with the plan. The patient knows to call the clinic with any problems, questions or concerns. We can certainly see the patient much sooner if necessary.  The patient was discussed with Dr. Marin Olp and he is in agreement with the aforementioned.   Valerie Peace, NP

## 2020-02-12 ENCOUNTER — Telehealth: Payer: Self-pay | Admitting: Acute Care

## 2020-02-12 NOTE — Telephone Encounter (Signed)
Valerie Key, can you contact to to schedule Chest CT?

## 2020-02-15 NOTE — Telephone Encounter (Signed)
I have spoken with Valerie Key and her LCS CT has been scheduled at [place] in [facility] on 02/26/2020 @ 9:30am right after her ultra sound

## 2020-02-26 ENCOUNTER — Ambulatory Visit (HOSPITAL_BASED_OUTPATIENT_CLINIC_OR_DEPARTMENT_OTHER)
Admission: RE | Admit: 2020-02-26 | Discharge: 2020-02-26 | Disposition: A | Discharge status: Home / Self Care | Payer: Commercial Managed Care - PPO | Source: Ambulatory Visit | Attending: Family | Admitting: Family

## 2020-02-26 ENCOUNTER — Other Ambulatory Visit: Payer: Self-pay

## 2020-02-26 ENCOUNTER — Ambulatory Visit (HOSPITAL_BASED_OUTPATIENT_CLINIC_OR_DEPARTMENT_OTHER)
Admission: RE | Admit: 2020-02-26 | Discharge: 2020-02-26 | Disposition: A | Discharge status: Home / Self Care | Payer: Commercial Managed Care - PPO | Source: Ambulatory Visit | Attending: Acute Care | Admitting: Acute Care

## 2020-02-26 ENCOUNTER — Inpatient Hospital Stay: Payer: Commercial Managed Care - PPO

## 2020-02-26 DIAGNOSIS — K760 Fatty (change of) liver, not elsewhere classified: Secondary | ICD-10-CM | POA: Diagnosis present

## 2020-02-26 DIAGNOSIS — F17200 Nicotine dependence, unspecified, uncomplicated: Secondary | ICD-10-CM | POA: Diagnosis present

## 2020-02-26 DIAGNOSIS — F17210 Nicotine dependence, cigarettes, uncomplicated: Secondary | ICD-10-CM

## 2020-02-26 DIAGNOSIS — R233 Spontaneous ecchymoses: Secondary | ICD-10-CM

## 2020-02-26 DIAGNOSIS — R109 Unspecified abdominal pain: Secondary | ICD-10-CM | POA: Insufficient documentation

## 2020-02-26 DIAGNOSIS — D72829 Elevated white blood cell count, unspecified: Secondary | ICD-10-CM | POA: Diagnosis not present

## 2020-02-26 DIAGNOSIS — Z122 Encounter for screening for malignant neoplasm of respiratory organs: Secondary | ICD-10-CM | POA: Insufficient documentation

## 2020-02-26 NOTE — Progress Notes (Signed)
Please call patient and let them  know their  low dose Ct was read as a Lung RADS 2: nodules that are benign in appearance and behavior with a very low likelihood of becoming a clinically active cancer due to size or lack of growth. Recommendation per radiology is for a repeat LDCT in 12 months. Please let them  know we will order and schedule their  annual screening scan for 02/2021. Please let them  know there was notation of CAD on their  scan.  Please remind the patient  that this is a non-gated exam therefore degree or severity of disease  cannot be determined. Please have them  follow up with their PCP regarding potential risk factor modification, dietary therapy or pharmacologic therapy if clinically indicated. Pt.  is  currently on statin therapy. Please place order for annual  screening scan for  02/2021 and fax results to PCP. Thanks so much.  Langley Gauss, this patient does have CAD. Please make sure they know . He needs to follow up with his PCP.

## 2020-02-29 ENCOUNTER — Telehealth: Payer: Self-pay | Admitting: *Deleted

## 2020-02-29 NOTE — Telephone Encounter (Signed)
As noted below by Dr. Marin Olp, I informed the patient that the US showed fatty liver (you have a history of) and the spleen looks good!!! She verbalized understanding.

## 2020-02-29 NOTE — Telephone Encounter (Signed)
-----   Message from Eliezer Bottom, NP sent at 02/29/2020 11:18 AM EDT ----- US showed fatty liver which we suspected (she has history of) and spleen looks good!  ----- Message ----- From: Interface, Rad Results In Sent: 02/26/2020   3:41 PM EDT To: Eliezer Bottom, NP

## 2020-03-01 LAB — UIFE/LIGHT CHAINS/TP QN, 24-HR UR
FR KAPPA LT CH,24HR: 2.72 mg/24 hr
FR LAMBDA LT CH,24HR: <1.01 mg/24 hr
Free Kappa Lt Chains,Ur: 1.81 mg/L (ref 0.63–113.79)
Free Kappa/Lambda Ratio: >2.7 (ref 1.03–31.76)
Free Lambda Lt Chains,Ur: <0.67 mg/L (ref 0.47–11.77)
Total Protein, Urine-Ur/day: 227 mg/24 hr — ABNORMAL HIGH (ref 30–150)
Total Protein, Urine: 15.1 mg/dL
Total Volume: 1500

## 2020-03-02 ENCOUNTER — Other Ambulatory Visit: Payer: Self-pay | Admitting: *Deleted

## 2020-03-02 DIAGNOSIS — F17210 Nicotine dependence, cigarettes, uncomplicated: Secondary | ICD-10-CM

## 2020-03-07 ENCOUNTER — Telehealth: Payer: Self-pay | Admitting: *Deleted

## 2020-03-07 NOTE — Telephone Encounter (Signed)
As noted below by Laverna Peace, NP, I left a message stating that there was no M-spike noted, the urine is OK. No intervention needed at this time. We will see you at your six month appointment. Please call the office with any concerns or questions if needed before your next appointment.

## 2020-03-07 NOTE — Telephone Encounter (Signed)
-----   Message from Eliezer Bottom, NP sent at 03/05/2020 11:11 AM EDT ----- No m-spike noted!!!!! WOO HOO!!!!! Urine is ok. No intervention needed at this time. We will see her at her appointment in 6 months.   ----- Message ----- From: Interface, Lab In [place] Sent: 03/01/2020   2:37 PM EDT To: Eliezer Bottom, NP

## 2020-03-08 ENCOUNTER — Telehealth: Payer: Self-pay

## 2020-03-08 NOTE — Telephone Encounter (Signed)
Referral from [facility] Phone [phone] Fax [phone] Sent to Scheduling Notes in Chart Prep

## 2020-03-16 ENCOUNTER — Ambulatory Visit: Payer: Commercial Managed Care - PPO | Admitting: Internal Medicine

## 2020-03-16 ENCOUNTER — Other Ambulatory Visit: Payer: Self-pay

## 2020-03-16 ENCOUNTER — Encounter: Payer: Self-pay | Admitting: Internal Medicine

## 2020-03-16 DIAGNOSIS — I10 Essential (primary) hypertension: Secondary | ICD-10-CM | POA: Diagnosis not present

## 2020-03-16 DIAGNOSIS — J449 Chronic obstructive pulmonary disease, unspecified: Secondary | ICD-10-CM

## 2020-03-16 DIAGNOSIS — F17210 Nicotine dependence, cigarettes, uncomplicated: Secondary | ICD-10-CM | POA: Diagnosis not present

## 2020-03-16 NOTE — Progress Notes (Signed)
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 Acute respiratory failure with hypoxia ([place]) . Chronic back pain . Anxiety . COPD with acute exacerbation ([place]) . Depression . GERD (gastroesophageal reflux disease) . Hyperlipidemia . Tobacco use   History of Present Illness  07/16/2018  Pulmonary/ 1st office eval/Valerie Key / active smoking  Chief Complaint  Patient presents with  . Pulmonary Consult    Referred by Dr Tana Coast for eval of COPD. Pt states her breathing is "great". She stopped Breo approx 1 wk ago due to thrush. She has a rescue inhaler that she uses once every 2 wks approx.   Dyspnea:  More doe x MMRC2 = can't walk a nl pace on a flat grade s sob but does fine slow and flat Cough: minimum in am  Sleep: bed is flat with big pillow SABA use: back to baseline / rarely need  rec The key is to stop smoking completely before smoking completely stops you! - For smoking cessation classes call [phone]   Plan A = Automatic = Symbicort 80 Take 2 puffs first thing in am and then another 2 puffs about 12 hours later.  Work on inhaler technique:   Plan B = Backup Only use your albuterol inhaler(proair) as a rescue medication  Please schedule a follow up office visit in 4 weeks, sooner if needed with PFTs on return     08/15/2018 Referral: Lung cancer screening clinic Medications: Add Spiriva 1.25- take 2 puffs once daily (sample given, if helps let us know will send in RX) Continue Symbicort - take 2 puffs twice daily Back up - Albuterol rescue inhaler  as needed every 4 hours for shortness of breath/wheezing  Follow-up: 3-4 months with Dr. Melvyn Novas or sooner if breathing worsening  Due for Pneumococcal 23 vaccine at next visits (d/t smoking hx and age)    11/17/2018  f/u ov/Valerie Key re: COPD  3 on symb 80 and spiriva 1.25 x 2  Chief Complaint  Patient presents with  . Follow-up    Breathing is doing well and no new co's. She is using her albuterol inhaler once per month on average.   Dyspnea:  MMRC2 = can't walk a nl pace on a flat grade s sob but does fine slow and flat eg Cough: minimal am  Sleeping: bed flat/ 2 pillows  Due to reflux  SABA use: rarely 02: none  rec Increase spiriva  2.5 mcg  X 2 puffs each am  Work on inhaler technique:   Good luck with the stop smoking    06/08/2019  f/u ov/Valerie Key re:  GOLD 3  / still smoking on symb 160/spiriva 2.5  Chief Complaint  Patient presents with  . Follow-up    COPD, no symptoms  Dyspnea:  Varies a lot week to week, typically stops after a mile on a bad day and finish the mile on good day Cough: denies but did cough occ  during exam/interview Sleeping: ok on side 2 pillows flat bed  SABA use: once every few  weeks 02: none  Overt hb despite ppi rec Plan A = Automatic = Always=    symbicort increase to  160 Take 2 puffs first thing in am and then another 2 puffs about 12 hours later and spiriva 2.5 x 2 puffs first thing in am Plan B = Backup (to supplement plan A, not to replace it) Only use your albuterol inhaler as a rescue medication The key is to stop smoking completely before smoking completely stops you!   Changed from lisinopril to just just HCTZ around 12/2019   03/16/2020  f/u ov/Valerie Key re: GOLD 2 copd/ still smoking Chief Complaint  Patient presents with  . Follow-up    Denies any problems with breathing  Dyspnea:   Walks up 30 min s stopping  Cough: some in am's  Sleeping: on side with 2 pillows  SABA use: rarely  02: none    No obvious day to day or daytime  variability or assoc excess/ purulent sputum or mucus plugs or hemoptysis or cp or chest tightness, subjective wheeze or overt sinus or hb symptoms.   Sleeping  without nocturnal  or early am exacerbation  of respiratory  c/o's or need for noct saba. Also denies any obvious fluctuation of symptoms with weather or environmental changes or other aggravating or alleviating factors except as outlined above   No unusual exposure hx or h/o childhood pna/ asthma or knowledge of premature birth.  Current Allergies, Complete Past Medical History, Past Surgical History, Family History, and Social History were reviewed in [employer] record.  ROS  The following are not active complaints unless bolded Hoarseness, sore throat, dysphagia, dental problems, itching, sneezing,  nasal congestion or discharge of excess mucus or purulent secretions, ear ache,   fever, chills, sweats, unintended wt loss or wt gain, classically pleuritic or exertional cp,  orthopnea pnd or arm/hand swelling  or leg swelling, presyncope, palpitations, abdominal pain, anorexia, nausea, vomiting, diarrhea  or change in bowel habits or change in bladder habits, change in stools or change in urine, dysuria, hematuria,  rash, arthralgias, visual complaints, headache, numbness, weakness or ataxia or problems with walking or coordination,  change in mood or  Memory. Bruising ? From meloxicam, improving off it         Current Meds  Medication Sig  . ALPRAZolam (XANAX) 0.5 MG tablet Take 0.25 mg by mouth daily as needed for anxiety.   . budesonide-formoterol (SYMBICORT) 160-4.5 MCG/ACT inhaler Take 2 puffs first thing in am and then another 2 puffs about 12 hours later.  . Cholecalciferol (VITAMIN D3) 125 MCG (5000 UT) CAPS Take 1 capsule by mouth daily.  . citalopram (CELEXA) 20 MG tablet Take 20 mg by mouth at bedtime.   . Coenzyme Q10 10 MG capsule Take 10 mg by mouth daily.  . hydrochlorothiazide (HYDRODIURIL) 12.5 MG  tablet Take 12.5 mg by mouth daily.  Marland Kitchen HYDROcodone-acetaminophen (NORCO) 10-325 MG tablet Take 1 tablet by mouth every 4 (four) hours as needed.  Marland Kitchen levocetirizine (XYZAL) 5 MG tablet Take 5 mg by mouth at bedtime.  . pantoprazole (PROTONIX) 40 MG tablet Take 1 tablet (40 mg total) by mouth daily.  Marland Kitchen PROAIR HFA 108 (90 Base) MCG/ACT inhaler Inhale 1 puff into the lungs every 4 (four) hours as needed for wheezing or shortness of breath.  . rosuvastatin (CRESTOR) 10 MG tablet Take 10 mg by mouth every evening.  . Tiotropium Bromide Monohydrate (SPIRIVA RESPIMAT) 2.5 MCG/ACT AERS Inhale 2 puffs into the  lungs daily.  . vitamin E (VITAMIN E) 400 UNIT capsule Take 2 capsules (800 Units total) by mouth daily.                         Objective:    Pleasant amb wf nad mild pseudowheeze   03/16/2020      154 06/08/2019          155   11/17/18 154 lb 3.2 oz (69.9 kg)  08/15/18 153 lb (69.4 kg)  07/16/18 152 lb 9.6 oz (69.2 kg)    Vital signs reviewed  03/16/2020  - Note at rest 02 sats  95% on RA       Reports Edentulous   HEENT : pt wearing mask not removed for exam due to covid -19 concerns.    NECK :  without JVD/Nodes/TM/ nl carotid upstrokes bilaterally   LUNGS: no acc muscle use,  Mod barrel  contour chest wall with bilateral  Distant bs s audible wheeze and  without cough on insp or exp maneuvers and mod  Hyperresonant  to  percussion bilaterally     CV:  RRR  no s3 or murmur or increase in P2, and no edema   ABD:  soft and nontender with pos mid insp Hoover's  in the supine position. No bruits or organomegaly appreciated, bowel sounds nl  MS:     ext warm without deformities, calf tenderness, cyanosis or clubbing No obvious joint restrictions   SKIN: warm and dry without lesions    NEURO:  alert, approp, nl sensorium with  no motor or cerebellar deficits apparent.           I personally reviewed images and agree with radiology impression as follows:   Chest  LDCT  02/26/20  1. Lung-RADS 2S, benign appearance or behavior. Continue annual screening with low-dose chest CT without contrast in 12 months.        Assessment

## 2020-03-16 NOTE — Assessment & Plan Note (Signed)
Off acei ? 12/2019 > improved 03/16/2020   Although even in retrospect it may not be clear the ACEi contributed to the pt's symptoms,  Pt improved off them and adding them back at this point or in the future would risk confusion in interpretation of non-specific respiratory symptoms to which this patient is prone  ie  Better not to muddy the waters here.   >>> Adequate control on present rx, reviewed in detail with pt > no change in rx needed  > f/u PCP

## 2020-03-16 NOTE — Assessment & Plan Note (Addendum)
Enrolled in LDCT program - see CT 02/26/20   Lung-RADS 2    Counseled re importance of smoking cessation but did not meet time criteria for separate billing     Advised:  Low-dose CT lung cancer screening is recommended for patients who are 57-57 years of age with a 30+ pack-year history of smoking, and who are currently smoking or quit <=15 years ago.    >>> continue program but advised stopping smoking saves more patients from cancer death than serial CT and important to continue both efforts          Each maintenance medication was reviewed in detail including emphasizing most importantly the difference between maintenance and prns and under what circumstances the prns are to be triggered using an action plan format where appropriate.  Total time for H and P, chart review, counseling, teaching device and generating customized AVS unique to this office visit / charting = 25 min

## 2020-03-16 NOTE — Patient Instructions (Signed)
The key is to stop smoking completely before smoking completely stops you!    I very strongly recommend you get the   pfizer vaccine as soon as possible based on your risk of dying from the virus  and the proven safety and benefit of these vaccines against even the delta variant.  This can save your life as well as  those of your loved ones,  especially if they are also not vaccinated.   Please schedule a follow up visit in 12 months but call sooner if needed

## 2020-03-16 NOTE — Assessment & Plan Note (Signed)
Active smoker - alpha one AT levels 06/14/17 = 156  (MM proven 06/08/19) - Spirometry 07/16/2018  FEV1 1.4 (53%)  Ratio 0.58 typical curvature   - 07/16/2018  After extensive coaching inhaler device,  effectiveness =    75% with hfa so try symbicort 160 2bid - PFTs 08/15/2018    FEV1 1.12  (41 % ) ratio 0.48  p no % improvement from saba p symb 80 x 2  prior to study with DLCO  78/78c % corrects to 105  % for alv volume  And copd curvature on f/v loop > spiriva 1.25 x 2 added  - 11/17/2018   > continue symb 80 and increase spiriva to 2.5 x 2 puffs daily  - 06/08/2019  After extensive coaching inhaler device,  effectiveness =    90% increase symb to 160 2bid  - Alpha one Phenotype 06/08/2019  = MM   Group D in terms of symptom/risk and laba/lama/ICS  therefore appropriate rx at this point >>>  Continue symb/spiriva and prn saba  Re saba: I spent extra time with pt today reviewing appropriate use of albuterol for prn use on exertion with the following points: 1) saba is for relief of sob that does not improve by walking a slower pace or resting but rather if the pt does not improve after trying this first. 2) If the pt is convinced, as many are, that saba helps recover from activity faster then it's easy to tell if this is the case by re-challenging : ie stop, take the inhaler, then p 5 minutes try the exact same activity (intensity of workload) that just caused the symptoms and see if they are substantially diminished or not after saba 3) if there is an activity that reproducibly causes the symptoms, try the saba 15 min before the activity on alternate days   If in fact the saba really does help, then fine to continue to use it prn but advised may need to look closer at the maintenance regimen being used to achieve better control of airways disease with exertion.

## 2020-04-21 ENCOUNTER — Other Ambulatory Visit: Payer: Self-pay | Admitting: Internal Medicine

## 2020-06-28 ENCOUNTER — Ambulatory Visit: Payer: Commercial Managed Care - PPO | Admitting: Medical

## 2020-06-28 ENCOUNTER — Other Ambulatory Visit: Payer: Self-pay

## 2020-06-28 VITALS — BP 131/80 | HR 96 | Resp 18 | Ht 64.0 in | Wt 153.4 lb

## 2020-06-28 DIAGNOSIS — G894 Chronic pain syndrome: Secondary | ICD-10-CM | POA: Insufficient documentation

## 2020-06-28 DIAGNOSIS — K5790 Diverticulosis of intestine, part unspecified, without perforation or abscess without bleeding: Secondary | ICD-10-CM | POA: Insufficient documentation

## 2020-06-28 DIAGNOSIS — F17210 Nicotine dependence, cigarettes, uncomplicated: Secondary | ICD-10-CM | POA: Diagnosis not present

## 2020-06-28 DIAGNOSIS — Z1239 Encounter for other screening for malignant neoplasm of breast: Secondary | ICD-10-CM

## 2020-06-28 DIAGNOSIS — I2510 Atherosclerotic heart disease of native coronary artery without angina pectoris: Secondary | ICD-10-CM

## 2020-06-28 DIAGNOSIS — F419 Anxiety disorder, unspecified: Secondary | ICD-10-CM

## 2020-06-28 DIAGNOSIS — F32A Depression, unspecified: Secondary | ICD-10-CM

## 2020-06-28 DIAGNOSIS — M1990 Unspecified osteoarthritis, unspecified site: Secondary | ICD-10-CM | POA: Insufficient documentation

## 2020-06-28 DIAGNOSIS — K2970 Gastritis, unspecified, without bleeding: Secondary | ICD-10-CM | POA: Insufficient documentation

## 2020-06-28 DIAGNOSIS — I10 Essential (primary) hypertension: Secondary | ICD-10-CM | POA: Diagnosis not present

## 2020-06-28 DIAGNOSIS — J449 Chronic obstructive pulmonary disease, unspecified: Secondary | ICD-10-CM

## 2020-06-28 DIAGNOSIS — M549 Dorsalgia, unspecified: Secondary | ICD-10-CM | POA: Insufficient documentation

## 2020-06-28 DIAGNOSIS — J45909 Unspecified asthma, uncomplicated: Secondary | ICD-10-CM | POA: Insufficient documentation

## 2020-06-28 DIAGNOSIS — T7840XA Allergy, unspecified, initial encounter: Secondary | ICD-10-CM | POA: Insufficient documentation

## 2020-06-28 DIAGNOSIS — K760 Fatty (change of) liver, not elsewhere classified: Secondary | ICD-10-CM | POA: Insufficient documentation

## 2020-06-28 DIAGNOSIS — Z87442 Personal history of urinary calculi: Secondary | ICD-10-CM | POA: Insufficient documentation

## 2020-06-28 DIAGNOSIS — K449 Diaphragmatic hernia without obstruction or gangrene: Secondary | ICD-10-CM | POA: Insufficient documentation

## 2020-06-28 MED ORDER — CITALOPRAM HYDROBROMIDE 20 MG PO TABS: 20.0000 mg | ORAL_TABLET | Freq: Every day | ORAL | 3 refills | Status: DC

## 2020-06-28 MED ORDER — HYDROCHLOROTHIAZIDE 12.5 MG PO TABS: 12.5000 mg | ORAL_TABLET | Freq: Every day | ORAL | 3 refills | Status: DC

## 2020-06-28 MED ORDER — LEVOCETIRIZINE DIHYDROCHLORIDE 5 MG PO TABS: 5.0000 mg | ORAL_TABLET | Freq: Every day | ORAL | 3 refills | Status: DC

## 2020-06-28 NOTE — Patient Instructions (Addendum)
Your bp is high today. But first time in office. Recommend check bp at home when relaxed and give me update by Friday on levels. Continue hctz 12.5 mg daily. May need to add med.   For allergies controlled continue xyzal.  For hx of smoking try to quit. Might add low dose wellbutrin in future. Want to see how bp is first.  For chronic low back pain continue with pain management  For depression that appears stable continue celexa. For anxiety recommend pain MD fill xanax.   For high cholesterol and CAD on CT continue crestor and referred to cardiologist.  Follow up in 3-4 weeks or as needed

## 2020-06-28 NOTE — Progress Notes (Signed)
Subjective:    Patient ID: Valerie Key, female    DOB: 12-09-1960, 60 y.o.   MRN: [id number]  HPI  Pt in for first time.  Pt states she works for [employer]. They had MD that would do [facility] but that was shut down. So needs new pcp.     Pt has history of htn, allergies, copd, anxiety, asthma, gerd, depression, CAD and hyperlipidemia.   Pt had 40+ year pack a day smoker.  Pt bp is mild high today.  Pt has chronic back pain. She sees specialist. She is on norco.  Hx of anxiety. She takes xanax. This is prescribed by pain doctor.  Depression history. Pt is citalopram.   Copd history. Sees pulmonolgist. On Spiriva.  Allergic rhinitis stable. Takes xyzal year round.  High cholesterol and CAD. On crestor.  Hx of smoking heavy for 40+ years.     Review of Systems  Constitutional: Negative for chills, fatigue and fever.  HENT: Negative for congestion.   Respiratory: Negative for cough, chest tightness, shortness of breath and wheezing.   Cardiovascular: Negative for chest pain and palpitations.  Gastrointestinal: Negative for abdominal pain and nausea.  Genitourinary: Negative for difficulty urinating and frequency.  Musculoskeletal: Negative for back pain and myalgias.  Skin: Negative for rash.  Neurological: Negative for dizziness, weakness, numbness and headaches.  Hematological: Negative for adenopathy. Does not bruise/bleed easily.  Psychiatric/Behavioral: Positive for dysphoric mood. Negative for agitation, behavioral problems, confusion, hallucinations, sleep disturbance and suicidal ideas. The patient is nervous/anxious.    Past Medical History:  Diagnosis Date  . Allergy    SEASONAL  . Anxiety   . Arthritis   . Asthma   . Back pain   . Chronic pain syndrome   . COPD (chronic obstructive pulmonary disease) ([place])   . Depression   . Diverticulosis   . Fatty liver   . Gastritis   . GERD (gastroesophageal reflux disease)    . Hiatal hernia   . History of kidney stones   . Hyperlipidemia      Social History   Socioeconomic History  . Marital status: Legally Separated    Spouse name: Not on file  . Number of children: 2  . Years of education: Not on file  . Highest education level: Not on file  Occupational History  . Occupation: Armed forces operational officer: [employer]  Tobacco Use  . Smoking status: Current Every Day Smoker    Packs/day: 0.50    Years: 39.00    Pack years: 19.50    Types: Cigarettes  . Smokeless tobacco: Never Used  Vaping Use  . Vaping Use: Never used  Substance and Sexual Activity  . Alcohol use: Yes    Comment: 1-2 daily  . Drug use: No  . Sexual activity: Yes    Birth control/protection: Surgical  Other Topics Concern  . Not on file  Social History Narrative  . Not on file   Social Determinants of Health   Financial Resource Strain: Not on file  Food Insecurity: Not on file  Transportation Needs: Not on file  Physical Activity: Not on file  Stress: Not on file  Social Connections: Not on file  Intimate Partner Violence: Not on file    Past Surgical History:  Procedure Laterality Date  . ABDOMINAL HYSTERECTOMY     with bladder tact  . BLADDER TACK    . CHOLECYSTECTOMY N/A 07/26/2017   Procedure: LAPAROSCOPIC CHOLECYSTECTOMY;  Surgeon: Clovis Riley, MD;  Location: [facility];  Service: General;  Laterality: N/A;    Family History  Problem Relation Age of Onset  . Diabetes Father   . Colon cancer Paternal Uncle        x 2  . Heart disease Maternal Grandfather   . Heart disease Paternal Grandfather     Allergies  Allergen Reactions  . Sulfa Antibiotics Hives and Rash    Current Outpatient Medications on File Prior to Visit  Medication Sig Dispense Refill  . ALPRAZolam (XANAX) 0.5 MG tablet Take 0.25 mg by mouth daily as needed for anxiety.     . budesonide-formoterol (SYMBICORT) 160-4.5 MCG/ACT inhaler Take 2 puffs first thing in am and then  another 2 puffs about 12 hours later. 1 Inhaler 3  . Cholecalciferol (VITAMIN D3) 125 MCG (5000 UT) CAPS Take 1 capsule by mouth daily.    . citalopram (CELEXA) 20 MG tablet Take 20 mg by mouth at bedtime.   1  . Coenzyme Q10 10 MG capsule Take 10 mg by mouth daily.    . hydrochlorothiazide (HYDRODIURIL) 12.5 MG tablet Take 12.5 mg by mouth daily.    Marland Kitchen HYDROcodone-acetaminophen (NORCO) 10-325 MG tablet Take 1 tablet by mouth every 4 (four) hours as needed.    Marland Kitchen levocetirizine (XYZAL) 5 MG tablet Take 5 mg by mouth at bedtime.    . pantoprazole (PROTONIX) 40 MG tablet Take 1 tablet (40 mg total) by mouth daily. 30 tablet 11  . PROAIR HFA 108 (90 Base) MCG/ACT inhaler Inhale 1 puff into the lungs every 4 (four) hours as needed for wheezing or shortness of breath. 1 Inhaler 3  . rosuvastatin (CRESTOR) 10 MG tablet Take 10 mg by mouth every evening.    Marland Kitchen SPIRIVA RESPIMAT 2.5 MCG/ACT AERS INHALE 2 PUFFS BY MOUTH INTO THE LUNGS DAILY 4 g 3  . vitamin E (VITAMIN E) 400 UNIT capsule Take 2 capsules (800 Units total) by mouth daily. 60 capsule 2  . meloxicam (MOBIC) 15 MG tablet Take 15 mg by mouth daily. (Patient not taking: Reported on 06/28/2020)    . nicotine (NICODERM CQ - DOSED IN MG/24 HOURS) 21 mg/24hr patch Place 1 patch (21 mg total) onto the skin daily. (Patient not taking: Reported on 06/28/2020) 28 patch 2   No current facility-administered medications on file prior to visit.    BP (!) 151/81 (BP Location: Left Arm, Patient Position: Sitting, Cuff Size: Normal)   Pulse 96   Resp 18   Ht 5\' 4"  (1.626 m)   Wt 153 lb 6.4 oz (69.6 kg)   SpO2 99%   BMI 26.33 kg/m       Objective:   Physical Exam   General Mental Status- Alert. General Appearance- Not in acute distress.   Skin General: Color- Normal Color. Moisture- Normal Moisture.  Neck Carotid Arteries- Normal color. Moisture- Normal Moisture. No carotid bruits. No JVD.  Chest and Lung Exam Auscultation: Breath  Sounds:-Normal.  Cardiovascular Auscultation:Rythm- Regular. Murmurs & Other Heart Sounds:Auscultation of the heart reveals- No Murmurs.  Abdomen Inspection:-Inspeection Normal. Palpation/Percussion:Note:No mass. Palpation and Percussion of the abdomen reveal- Non Tender, Non Distended + BS, no rebound or guarding.   Neurologic Cranial Nerve exam:- CN III-XII intact(No nystagmus), symmetric smile. Strength:- 5/5 equal and symmetric strength both upper and lower extremities.      Assessment & Plan:  Your bp is high today. But first time in office. Recommend check bp at home when relaxed and  give me update by Friday on levels. Continue hctz 12.5 mg daily. May need to add med.   For allergies controlled continue xyzal.  For hx of smoking try to quit. Might add low dose wellbutrin in future. Want to see how bp is first.  For chronic low back pain continue with pain management  For depression that appears stable continue celexa. For anxiety recommend pain MD fill xanax.   For high cholesterol and CAD on CT continue crestor and referred to cardiologist.  Follow up in 3-4 weeks or as needed  [employer], [employer]

## 2020-07-01 ENCOUNTER — Encounter: Payer: Self-pay | Admitting: Medical

## 2020-07-06 NOTE — Progress Notes (Unsigned)
Cardiology Office Note:    Date:  07/07/2020   ID:  Valerie Key, DOB 12/02/1960, MRN [id number]  PCP:  Mackie Pai, PA-C  Cardiologist:  Shirlee More, MD   Referring MD: Mackie Pai, PA-C  ASSESSMENT:    1. Coronary artery calcification seen on CT scan   2. Thoracic aortic atherosclerosis ([place])   3. COPD GOLD 3   4. Essential hypertension   5. Hyperlipidemia, unspecified hyperlipidemia type   6. Cigarette smoker    PLAN:    In order of problems listed above:  1. She is at increased cardiovascular risk for coronary artery calcification low-dose aspirin statin as appropriate continue rosuvastatin with goal LDL less than 70 and optimal antihypertensive therapy.  If symptomatic an ischemia evaluation be appropriate.  We decided to defer for the time being. 2. Stable hypertension she will continue to monitor and work with her PCP tells me the predominance of her numbers are less than 660 systolic 3. Stable COPD little or no symptomatology 4. Continue her high intensity statin I asked my office to call her PCP because I am unable to access a lipid profile in the chart 5. Cigarette smoking given instructions and and referral online to the American Heart Association or along with referral to the American Heart Association andg lung Association for coaching and education  Next appointment as needed   Medication Adjustments/Labs and Tests Ordered: Current medicines are reviewed at length with the patient today.  Concerns regarding medicines are outlined above.  Orders Placed This Encounter  Procedures  . EKG 12-Lead   Meds ordered this encounter  Medications  . aspirin EC 81 MG tablet    Sig: Take 1 tablet (81 mg total) by mouth daily. Swallow whole.    Dispense:  90 tablet    Refill:  3     No chief complaint on file.   History of Present Illness:    Valerie Key is a 60 y.o. female with a history of COPD and elevated blood pressure on  her last visit who is being seen today for the evaluation of CAD at the request of [place], Percell Miller, [place]. She was noted to have hyperlipidemia but I do not see a lipid profile in her chart  She had a screening CT scan for lung cancer 02/26/2020 showed aortic atherosclerosis involving the great vessels and the coronary arteries all 3 vessels.  She has dyslipidemia is taken a high intensity statin for the last 3 years.  She has no known history of heart disease congenital rheumatic or heart murmur.  Despite her COPD she has minimal symptoms only walking longer distance or uphill does not interfere with her life sadly she continues to smoke and I gave her information on referral for an online teaching and coach American Heart Association American lung Association.  She has good healthcare literacy I told her the important response to vascular calcification is reassess cardiovascular risk and a statin is appropriate to remain on it I also asked her to take low-dose aspirin and continue antihypertensive therapy.  If another agent was needed ARB would be appropriate.  Another option is an ischemia evaluation we discussed that at this point time she prefers not to do that and will sign off to my chart and will contact me if she has cardiovascular symptoms. Past Medical History:  Diagnosis Date  . Allergy    SEASONAL  . Anxiety   . Arthritis   . Asthma   . Back  pain   . Chronic pain syndrome   . COPD (chronic obstructive pulmonary disease) ([place])   . Depression   . Diverticulosis   . Fatty liver   . Gastritis   . GERD (gastroesophageal reflux disease)   . Hiatal hernia   . History of kidney stones   . Hyperlipidemia     Past Surgical History:  Procedure Laterality Date  . ABDOMINAL HYSTERECTOMY     with bladder tact  . BLADDER TACK    . CHOLECYSTECTOMY N/A 07/26/2017   Procedure: LAPAROSCOPIC CHOLECYSTECTOMY;  Surgeon: Clovis Riley, MD;  Location: [facility];  Service: General;  Laterality:  N/A;    Current Medications: Current Meds  Medication Sig  . ALPRAZolam (XANAX) 0.5 MG tablet Take 0.25 mg by mouth daily as needed for anxiety.   Marland Kitchen aspirin EC 81 MG tablet Take 1 tablet (81 mg total) by mouth daily. Swallow whole.  . budesonide-formoterol (SYMBICORT) 160-4.5 MCG/ACT inhaler Take 2 puffs first thing in am and then another 2 puffs about 12 hours later.  . Cholecalciferol (VITAMIN D3) 125 MCG (5000 UT) CAPS Take 1 capsule by mouth daily.  . citalopram (CELEXA) 20 MG tablet Take 1 tablet (20 mg total) by mouth daily.  . Coenzyme Q10 10 MG capsule Take 10 mg by mouth daily.  . hydrochlorothiazide (HYDRODIURIL) 12.5 MG tablet Take 1 tablet (12.5 mg total) by mouth daily.  Marland Kitchen HYDROcodone-acetaminophen (NORCO) 10-325 MG tablet Take 1 tablet by mouth every 4 (four) hours as needed.  Marland Kitchen levocetirizine (XYZAL) 5 MG tablet Take 1 tablet (5 mg total) by mouth at bedtime.  . pantoprazole (PROTONIX) 40 MG tablet Take 1 tablet (40 mg total) by mouth daily.  Marland Kitchen PROAIR HFA 108 (90 Base) MCG/ACT inhaler Inhale 1 puff into the lungs every 4 (four) hours as needed for wheezing or shortness of breath.  . rosuvastatin (CRESTOR) 10 MG tablet Take 10 mg by mouth every evening.  Marland Kitchen SPIRIVA RESPIMAT 2.5 MCG/ACT AERS INHALE 2 PUFFS BY MOUTH INTO THE LUNGS DAILY  . vitamin E (VITAMIN E) 400 UNIT capsule Take 2 capsules (800 Units total) by mouth daily.     Allergies:   Sulfa antibiotics   Social History   Socioeconomic History  . Marital status: Legally Separated    Spouse name: Not on file  . Number of children: 2  . Years of education: Not on file  . Highest education level: Not on file  Occupational History  . Occupation: Armed forces operational officer: [employer]  Tobacco Use  . Smoking status: Current Every Day Smoker    Packs/day: 0.50    Years: 39.00    Pack years: 19.50    Types: Cigarettes  . Smokeless tobacco: Never Used  Vaping Use  . Vaping Use: Never used  Substance and  Sexual Activity  . Alcohol use: Yes    Comment: 1-2 daily  . Drug use: No  . Sexual activity: Yes    Birth control/protection: Surgical  Other Topics Concern  . Not on file  Social History Narrative  . Not on file   Social Determinants of Health   Financial Resource Strain: Not on file  Food Insecurity: Not on file  Transportation Needs: Not on file  Physical Activity: Not on file  Stress: Not on file  Social Connections: Not on file     Family History: The patient's family history includes Colon cancer in her paternal uncle; Diabetes in her father; Heart disease in her  maternal grandfather and paternal grandfather.  ROS:   ROS Please see the history of present illness.     All other systems reviewed and are negative.  EKGs/Labs/Other Studies Reviewed:    The following studies were reviewed today:   EKG:  EKG is  ordered today.  The ekg ordered today is personally reviewed and demonstrates are poor R wave progression.  Recent Labs: 02/11/2020: ALT 16; BUN 19; Creatinine 0.90; Hemoglobin 14.0; Platelet Count 243; Potassium 3.5; Sodium 142  Recent Lipid Panel No results found for: CHOL, TRIG, HDL, CHOLHDL, VLDL, LDLCALC, LDLDIRECT  Physical Exam:    VS:  BP 140/80   Pulse 98   Ht 5\' 4"  (1.626 m)   Wt 151 lb 1.3 oz (68.5 kg)   SpO2 94%   BMI 25.93 kg/m     Wt Readings from Last 3 Encounters:  07/07/20 151 lb 1.3 oz (68.5 kg)  06/28/20 153 lb 6.4 oz (69.6 kg)  03/16/20 154 lb 12.8 oz (70.2 kg)     GEN: She does not look chronically ill or debilitated she has no xanthoma or xanthelasma well nourished, well developed in no acute distress HEENT: Normal NECK: No JVD; No carotid bruits LYMPHATICS: No lymphadenopathy CARDIAC: Distant heart sounds RRR, no murmurs, rubs, gallops RESPIRATORY:  Clear to auscultation without rales, wheezing or rhonchi  ABDOMEN: Soft, non-tender, non-distended MUSCULOSKELETAL:  No edema; No deformity  SKIN: Warm and dry NEUROLOGIC:   Alert and oriented x 3 PSYCHIATRIC:  Normal affect     Signed, Shirlee More, MD  07/07/2020 9:19 AM    [place]

## 2020-07-07 ENCOUNTER — Ambulatory Visit: Payer: Commercial Managed Care - PPO | Admitting: Cardiology

## 2020-07-07 ENCOUNTER — Encounter: Payer: Self-pay | Admitting: Cardiology

## 2020-07-07 ENCOUNTER — Other Ambulatory Visit: Payer: Self-pay

## 2020-07-07 VITALS — BP 140/80 | HR 98 | Ht 64.0 in | Wt 151.1 lb

## 2020-07-07 DIAGNOSIS — I700 Atherosclerosis of aorta: Secondary | ICD-10-CM

## 2020-07-07 DIAGNOSIS — I2510 Atherosclerotic heart disease of native coronary artery without angina pectoris: Secondary | ICD-10-CM | POA: Diagnosis not present

## 2020-07-07 DIAGNOSIS — F17210 Nicotine dependence, cigarettes, uncomplicated: Secondary | ICD-10-CM

## 2020-07-07 DIAGNOSIS — J449 Chronic obstructive pulmonary disease, unspecified: Secondary | ICD-10-CM | POA: Diagnosis not present

## 2020-07-07 DIAGNOSIS — I10 Essential (primary) hypertension: Secondary | ICD-10-CM

## 2020-07-07 DIAGNOSIS — E785 Hyperlipidemia, unspecified: Secondary | ICD-10-CM

## 2020-07-07 MED ORDER — ASPIRIN EC 81 MG PO TBEC: 81.0000 mg | DELAYED_RELEASE_TABLET | Freq: Every day | ORAL | 3 refills | Status: AC

## 2020-07-07 NOTE — Patient Instructions (Addendum)
Medication Instructions:  Your physician has recommended you make the following change in your medication:  START: Aspirin 81 mg take one tablet by mouth daily.  *If you need a refill on your cardiac medications before your next appointment, please call your pharmacy*   Lab Work: None If you have labs (blood work) drawn today and your tests are completely normal, you will receive your results only by: Marland Kitchen MyChart Message (if you have MyChart) OR . A paper copy in the mail If you have any lab test that is abnormal or we need to change your treatment, we will call you to review the results.   Testing/Procedures: None   Follow-Up: At [facility], you and your health needs are our priority.  As part of our continuing mission to provide you with exceptional heart care, we have created designated Provider Care Teams.  These Care Teams include your primary Cardiologist (physician) and Advanced Practice Providers (APPs -  Physician Assistants and Nurse Practitioners) who all work together to provide you with the care you need, when you need it.  We recommend signing up for the patient portal called "MyChart".  Sign up information is provided on this After Visit Summary.  MyChart is used to connect with patients for Virtual Visits (Telemedicine).  Patients are able to view lab/test results, encounter notes, upcoming appointments, etc.  Non-urgent messages can be sent to your provider as well.   To learn more about what you can do with MyChart, go to [email or web address].    Your next appointment:   As needed  The format for your next appointment:   In Person  Provider:   Shirlee More, MD   Other Instructions Managing the Challenge of Quitting Smoking Quitting smoking is a physical and mental challenge. You will face cravings, withdrawal symptoms, and temptation. Before quitting, work with your health care provider to make a plan that can help you manage quitting. Preparation can help  you quit and keep you from giving in. How to manage lifestyle changes Managing stress Stress can make you want to smoke, and wanting to smoke may cause stress. It is important to find ways to manage your stress. You might try some of the following:  Practice relaxation techniques. ? Breathe slowly and deeply, in through your nose and out through your mouth. ? Listen to music. ? Soak in a bath or take a shower. ? Imagine a peaceful place or vacation.  Get some support. ? Talk with family or friends about your stress. ? Join a support group. ? Talk with a counselor or therapist.  Get some physical activity. ? Go for a walk, run, or bike ride. ? Play a favorite sport. ? Practice yoga.   Medicines Talk with your health care provider about medicines that might help you deal with cravings and make quitting easier for you. Relationships Social situations can be difficult when you are quitting smoking. To manage this, you can:  Avoid parties and other social situations where people might be smoking.  Avoid alcohol.  Leave right away if you have the urge to smoke.  Explain to your family and friends that you are quitting smoking. Ask for support and let them know you might be a bit grumpy.  Plan activities where smoking is not an option. General instructions Be aware that many people gain weight after they quit smoking. However, not everyone does. To keep from gaining weight, have a plan in place before you quit and stick to the plan  after you quit. Your plan should include:  Having healthy snacks. When you have a craving, it may help to: ? Eat popcorn, carrots, celery, or other cut vegetables. ? Chew sugar-free gum.  Changing how you eat. ? Eat small portion sizes at meals. ? Eat 4-6 small meals throughout the day instead of 1-2 large meals a day. ? Be mindful when you eat. Do not watch television or do other things that might distract you as you eat.  Exercising  regularly. ? Make time to exercise each day. If you do not have time for a long workout, do short bouts of exercise for 5-10 minutes several times a day. ? Do some form of strengthening exercise, such as weight lifting. ? Do some exercise that gets your heart beating and causes you to breathe deeply, such as walking fast, running, swimming, or biking. This is very important.  Drinking plenty of water or other low-calorie or no-calorie drinks. Drink 6-8 glasses of water daily.   How to recognize withdrawal symptoms Your body and mind may experience discomfort as you try to get used to not having nicotine in your system. These effects are called withdrawal symptoms. They may include:  Feeling hungrier than normal.  Having trouble concentrating.  Feeling irritable or restless.  Having trouble sleeping.  Feeling depressed.  Craving a cigarette. To manage withdrawal symptoms:  Avoid places, people, and activities that trigger your cravings.  Remember why you want to quit.  Get plenty of sleep.  Avoid coffee and other caffeinated drinks. These may worsen some of your symptoms. These symptoms may surprise you. But be assured that they are normal to have when quitting smoking. How to manage cravings Come up with a plan for how to deal with your cravings. The plan should include the following:  A definition of the specific situation you want to deal with.  An alternative action you will take.  A clear idea for how this action will help.  The name of someone who might help you with this. Cravings usually last for 5-10 minutes. Consider taking the following actions to help you with your plan to deal with cravings:  Keep your mouth busy. ? Chew sugar-free gum. ? Suck on hard candies or a straw. ? Brush your teeth.  Keep your hands and body busy. ? Change to a different activity right away. ? Squeeze or play with a ball. ? Do an activity or a hobby, such as making bead jewelry,  practicing needlepoint, or working with wood. ? Mix up your normal routine. ? Take a short exercise break. Go for a quick walk or run up and down stairs.  Focus on doing something kind or helpful for someone else.  Call a friend or family member to talk during a craving.  Join a support group.  Contact a quitline. Where to find support To get help or find a support group:  Call the [facility]'s Smoking Quitline: 1-800-QUIT NOW 367-633-8036)  Visit the website of the Substance Abuse and [place]: ktimeonline.com  Text QUIT to SmokefreeTXTMQ:317211 Where to find more information Visit these websites to find more information on quitting smoking:  [place]: www.smokefree.gov  American Lung Association: www.lung.org  American Cancer Society: www.cancer.org  Centers for Disease Control and Prevention: http://www.wolf.info/  American Heart Association: www.heart.org Contact a health care provider if:  You want to change your plan for quitting.  The medicines you are taking are not helping.  Your eating  feels out of control or you cannot sleep. Get help right away if:  You feel depressed or become very anxious. Summary  Quitting smoking is a physical and mental challenge. You will face cravings, withdrawal symptoms, and temptation to smoke again. Preparation can help you as you go through these challenges.  Try different techniques to manage stress, handle social situations, and prevent weight gain.  You can deal with cravings by keeping your mouth busy (such as by chewing gum), keeping your hands and body busy, calling family or friends, or contacting a quitline for people who want to quit smoking.  You can deal with withdrawal symptoms by avoiding places where people smoke, getting plenty of rest, and avoiding drinks with caffeine. This information is not intended to replace advice given to you by your health care provider.  Make sure you discuss any questions you have with your health care provider. Document Revised: 03/10/2019 Document Reviewed: 03/10/2019 Elsevier Patient Education  [place].

## 2020-07-14 ENCOUNTER — Ambulatory Visit (HOSPITAL_BASED_OUTPATIENT_CLINIC_OR_DEPARTMENT_OTHER)
Admission: RE | Admit: 2020-07-14 | Discharge: 2020-07-14 | Disposition: A | Discharge status: Home / Self Care | Payer: Commercial Managed Care - PPO | Source: Ambulatory Visit | Attending: Medical | Admitting: Medical

## 2020-07-14 ENCOUNTER — Other Ambulatory Visit: Payer: Self-pay

## 2020-07-14 DIAGNOSIS — Z1239 Encounter for other screening for malignant neoplasm of breast: Secondary | ICD-10-CM

## 2020-07-25 ENCOUNTER — Other Ambulatory Visit: Payer: Self-pay

## 2020-07-26 ENCOUNTER — Encounter: Payer: Self-pay | Admitting: Medical

## 2020-07-26 ENCOUNTER — Ambulatory Visit: Payer: Commercial Managed Care - PPO | Admitting: Medical

## 2020-07-26 VITALS — BP 110/82 | HR 102 | Resp 16 | Ht 64.0 in | Wt 151.0 lb

## 2020-07-26 DIAGNOSIS — R0683 Snoring: Secondary | ICD-10-CM

## 2020-07-26 DIAGNOSIS — I10 Essential (primary) hypertension: Secondary | ICD-10-CM

## 2020-07-26 DIAGNOSIS — F32A Depression, unspecified: Secondary | ICD-10-CM

## 2020-07-26 DIAGNOSIS — F17210 Nicotine dependence, cigarettes, uncomplicated: Secondary | ICD-10-CM

## 2020-07-26 DIAGNOSIS — F419 Anxiety disorder, unspecified: Secondary | ICD-10-CM

## 2020-07-26 DIAGNOSIS — J449 Chronic obstructive pulmonary disease, unspecified: Secondary | ICD-10-CM

## 2020-07-26 MED ORDER — NICOTINE 21 MG/24HR TD PT24: 21.0000 mg | MEDICATED_PATCH | Freq: Every day | TRANSDERMAL | 0 refills | Status: DC

## 2020-07-26 NOTE — Patient Instructions (Addendum)
Your bp is well controlled today. Continue hctz 12.5 mg daily. By today reading and at home readings don't need to adjust doses.  For copd continue symbicort, spiriva and albuterol as needed.  For cigarette smoking presribed nicotine patches. Over next 3 months titrate down on dosage.  Depression stable but recent more stress/anxiety. Continue celexa and xanax.  For high cholesterol continue crestor.  For hx of snoring refer to pulmonologist.  Follow up in 3 month or as needed

## 2020-07-26 NOTE — Progress Notes (Signed)
Subjective:    Patient ID: Valerie Key, female    DOB: Oct 01, 1960, 60 y.o.   MRN: [id number]  HPI  Pt in for follow up.  BP is well controlled today. At home her bp is usually less than 789 systolic. Pt is on hctz one tab daily.  Pt has allergies in spring and fall. No allergy flares. She is on xyzal year round.   Pt is still smoking. She decline wellbutrin but does want to try patches.  Pt has depression. It is stable with celexa. More stress recently since fiancee passed away. Living with his sister. Accomadations are stressful.   Hx of high cholesterol and on crestor. Pt did see cardiologist. She is on baby aspirin a day.  Review of Systems  Constitutional: Negative for chills, fatigue and fever.  HENT: Negative for congestion, drooling, ear pain, mouth sores and postnasal drip.   Respiratory: Negative for cough, chest tightness, shortness of breath and wheezing.   Cardiovascular: Negative for chest pain and palpitations.  Gastrointestinal: Negative for abdominal pain.  Genitourinary: Negative for dysuria and flank pain.  Musculoskeletal: Negative for back pain and joint swelling.  Skin: Negative for rash.  Neurological: Negative for dizziness, light-headedness and headaches.  Hematological: Negative for adenopathy. Does not bruise/bleed easily.  Psychiatric/Behavioral: Negative for behavioral problems, decreased concentration, dysphoric mood and suicidal ideas.   Past Medical History:  Diagnosis Date  . Allergy    SEASONAL  . Anxiety   . Arthritis   . Asthma   . Back pain   . Chronic pain syndrome   . COPD (chronic obstructive pulmonary disease) ([place])   . Depression   . Diverticulosis   . Fatty liver   . Gastritis   . GERD (gastroesophageal reflux disease)   . Hiatal hernia   . History of kidney stones   . Hyperlipidemia      Social History   Socioeconomic History  . Marital status: Legally Separated    Spouse name: Not on file  . Number  of children: 2  . Years of education: Not on file  . Highest education level: Not on file  Occupational History  . Occupation: Armed forces operational officer: [employer]  Tobacco Use  . Smoking status: Current Every Day Smoker    Packs/day: 0.50    Years: 39.00    Pack years: 19.50    Types: Cigarettes  . Smokeless tobacco: Never Used  Vaping Use  . Vaping Use: Never used  Substance and Sexual Activity  . Alcohol use: Yes    Comment: 1-2 daily  . Drug use: No  . Sexual activity: Yes    Birth control/protection: Surgical  Other Topics Concern  . Not on file  Social History Narrative  . Not on file   Social Determinants of Health   Financial Resource Strain: Not on file  Food Insecurity: Not on file  Transportation Needs: Not on file  Physical Activity: Not on file  Stress: Not on file  Social Connections: Not on file  Intimate Partner Violence: Not on file    Past Surgical History:  Procedure Laterality Date  . ABDOMINAL HYSTERECTOMY     with bladder tact  . BLADDER TACK    . CHOLECYSTECTOMY N/A 07/26/2017   Procedure: LAPAROSCOPIC CHOLECYSTECTOMY;  Surgeon: Clovis Riley, MD;  Location: [facility];  Service: General;  Laterality: N/A;    Family History  Problem Relation Age of Onset  . Diabetes Father   .  Colon cancer Paternal Uncle        x 2  . Heart disease Maternal Grandfather   . Heart disease Paternal Grandfather     Allergies  Allergen Reactions  . Sulfa Antibiotics Hives and Rash    Current Outpatient Medications on File Prior to Visit  Medication Sig Dispense Refill  . ALPRAZolam (XANAX) 0.5 MG tablet Take 0.25 mg by mouth daily as needed for anxiety.     Marland Kitchen aspirin EC 81 MG tablet Take 1 tablet (81 mg total) by mouth daily. Swallow whole. 90 tablet 3  . budesonide-formoterol (SYMBICORT) 160-4.5 MCG/ACT inhaler Take 2 puffs first thing in am and then another 2 puffs about 12 hours later. 1 Inhaler 3  . Cholecalciferol (VITAMIN D3) 125 MCG  (5000 UT) CAPS Take 1 capsule by mouth daily.    . citalopram (CELEXA) 20 MG tablet Take 1 tablet (20 mg total) by mouth daily. 30 tablet 3  . Coenzyme Q10 10 MG capsule Take 10 mg by mouth daily.    . hydrochlorothiazide (HYDRODIURIL) 12.5 MG tablet Take 1 tablet (12.5 mg total) by mouth daily. 90 tablet 3  . HYDROcodone-acetaminophen (NORCO) 10-325 MG tablet Take 1 tablet by mouth every 4 (four) hours as needed.    Marland Kitchen levocetirizine (XYZAL) 5 MG tablet Take 1 tablet (5 mg total) by mouth at bedtime. 90 tablet 3  . pantoprazole (PROTONIX) 40 MG tablet Take 1 tablet (40 mg total) by mouth daily. 30 tablet 11  . PROAIR HFA 108 (90 Base) MCG/ACT inhaler Inhale 1 puff into the lungs every 4 (four) hours as needed for wheezing or shortness of breath. 1 Inhaler 3  . rosuvastatin (CRESTOR) 10 MG tablet Take 10 mg by mouth every evening.    Marland Kitchen SPIRIVA RESPIMAT 2.5 MCG/ACT AERS INHALE 2 PUFFS BY MOUTH INTO THE LUNGS DAILY 4 g 3  . vitamin E (VITAMIN E) 400 UNIT capsule Take 2 capsules (800 Units total) by mouth daily. 60 capsule 2   No current facility-administered medications on file prior to visit.    BP 110/82 (BP Location: Right Arm, Patient Position: Sitting, Cuff Size: Normal)   Pulse (!) 102   Resp 16   Ht 5\' 4"  (1.626 m)   Wt 151 lb (68.5 kg)   SpO2 98%   BMI 25.92 kg/m       Objective:   Physical Exam   General Mental Status- Alert. General Appearance- Not in acute distress.   Skin General: Color- Normal Color. Moisture- Normal Moisture.  Neck Carotid Arteries- Normal color. Moisture- Normal Moisture. No carotid bruits. No JVD.  Chest and Lung Exam Auscultation: Breath Sounds:-Normal.  Cardiovascular Auscultation:Rythm- Regular. Murmurs & Other Heart Sounds:Auscultation of the heart reveals- No Murmurs.  Abdomen Inspection:-Inspeection Normal. Palpation/Percussion:Note:No mass. Palpation and Percussion of the abdomen reveal- Non Tender, Non Distended + BS, no rebound  or guarding.    Neurologic Cranial Nerve exam:- CN III-XII intact(No nystagmus), symmetric smile. Strength:- 5/5 equal and symmetric strength both upper and lower extremities.     Assessment & Plan:  Your bp is well controlled today. Continue hctz 12.5 mg daily. By today reading and at home readings don't need to adjust doses.  For copd continue symbicort, spiriva and albuterol as needed.  For cigarette smoking presribed nicotine patches. Over next 3 months titrate down on dosage.  Depression stable but recent more stress/anxiety. Continue celexa and xanax.  Follow up in 3 month or as needed  For high cholesterol continue crestor.  Mackie Pai, PA-C

## 2020-08-11 ENCOUNTER — Other Ambulatory Visit: Payer: Self-pay

## 2020-08-11 ENCOUNTER — Ambulatory Visit (HOSPITAL_BASED_OUTPATIENT_CLINIC_OR_DEPARTMENT_OTHER)
Admission: RE | Admit: 2020-08-11 | Discharge: 2020-08-11 | Disposition: A | Discharge status: Home / Self Care | Payer: Commercial Managed Care - PPO | Source: Ambulatory Visit | Attending: Medical | Admitting: Medical

## 2020-08-11 DIAGNOSIS — Z1231 Encounter for screening mammogram for malignant neoplasm of breast: Secondary | ICD-10-CM | POA: Diagnosis not present

## 2020-08-17 ENCOUNTER — Ambulatory Visit: Payer: Medicare Other | Admitting: Pulmonary Disease

## 2020-08-17 ENCOUNTER — Other Ambulatory Visit: Payer: Self-pay | Admitting: Physician Assistant

## 2020-08-17 ENCOUNTER — Other Ambulatory Visit: Payer: Self-pay

## 2020-08-17 ENCOUNTER — Encounter: Payer: Self-pay | Admitting: Pulmonary Disease

## 2020-08-17 VITALS — BP 112/72 | HR 94 | Temp 97.2°F | Ht 64.0 in | Wt 152.0 lb

## 2020-08-17 DIAGNOSIS — R0683 Snoring: Secondary | ICD-10-CM | POA: Diagnosis not present

## 2020-08-17 NOTE — Patient Instructions (Signed)
Moderate probability of significant obstructive sleep apnea  We will schedule you for home sleep study Update your results as soon as reviewed  Weight loss efforts, regular exercises will help symptoms  Tentative follow-up in about 3 to 4 months Sleep Apnea Sleep apnea affects breathing during sleep. It causes breathing to stop for a short time or to become shallow. It can also increase the risk of:  Heart attack.  Stroke.  Being very overweight (obese).  Diabetes.  Heart failure.  Irregular heartbeat. The goal of treatment is to help you breathe normally again. What are the causes? There are three kinds of sleep apnea:  Obstructive sleep apnea. This is caused by a blocked or collapsed airway.  Central sleep apnea. This happens when the brain does not send the right signals to the muscles that control breathing.  Mixed sleep apnea. This is a combination of obstructive and central sleep apnea. The most common cause of this condition is a collapsed or blocked airway. This can happen if:  Your throat muscles are too relaxed.  Your tongue and tonsils are too large.  You are overweight.  Your airway is too small.   What increases the risk?  Being overweight.  Smoking.  Having a small airway.  Being older.  Being female.  Drinking alcohol.  Taking medicines to calm yourself (sedatives or tranquilizers).  Having family members with the condition. What are the signs or symptoms?  Trouble staying asleep.  Being sleepy or tired during the day.  Getting angry a lot.  Loud snoring.  Headaches in the morning.  Not being able to focus your mind (concentrate).  Forgetting things.  Less interest in sex.  Mood swings.  Personality changes.  Feelings of sadness (depression).  Waking up a lot during the night to pee (urinate).  Dry mouth.  Sore throat. How is this diagnosed?  Your medical history.  A physical exam.  A test that is done when you  are sleeping (sleep study). The test is most often done in a sleep lab but may also be done at home. How is this treated?  Sleeping on your side.  Using a medicine to get rid of mucus in your nose (decongestant).  Avoiding the use of alcohol, medicines to help you relax, or certain pain medicines (narcotics).  Losing weight, if needed.  Changing your diet.  Not smoking.  Using a machine to open your airway while you sleep, such as: ? An oral appliance. This is a mouthpiece that shifts your lower jaw forward. ? A CPAP device. This device blows air through a mask when you breathe out (exhale). ? An EPAP device. This has valves that you put in each nostril. ? A BPAP device. This device blows air through a mask when you breathe in (inhale) and breathe out.  Having surgery if other treatments do not work. It is important to get treatment for sleep apnea. Without treatment, it can lead to:  High blood pressure.  Coronary artery disease.  In men, not being able to have an erection (impotence).  Reduced thinking ability.   Follow these instructions at home: Lifestyle  Make changes that your doctor recommends.  Eat a healthy diet.  Lose weight if needed.  Avoid alcohol, medicines to help you relax, and some pain medicines.  Do not use any products that contain nicotine or tobacco, such as cigarettes, e-cigarettes, and chewing tobacco. If you need help quitting, ask your doctor. General instructions  Take over-the-counter and prescription  medicines only as told by your doctor.  If you were given a machine to use while you sleep, use it only as told by your doctor.  If you are having surgery, make sure to tell your doctor you have sleep apnea. You may need to bring your device with you.  Keep all follow-up visits as told by your doctor. This is important. Contact a doctor if:  The machine that you were given to use during sleep bothers you or does not seem to be  working.  You do not get better.  You get worse. Get help right away if:  Your chest hurts.  You have trouble breathing in enough air.  You have an uncomfortable feeling in your back, arms, or stomach.  You have trouble talking.  One side of your body feels weak.  A part of your face is hanging down. These symptoms may be an emergency. Do not wait to see if the symptoms will go away. Get medical help right away. Call your local emergency services (911 in the [place] Do not drive yourself to the hospital. Summary  This condition affects breathing during sleep.  The most common cause is a collapsed or blocked airway.  The goal of treatment is to help you breathe normally while you sleep. This information is not intended to replace advice given to you by your health care provider. Make sure you discuss any questions you have with your health care provider. Document Revised: 03/07/2018 Document Reviewed: 01/14/2018 Elsevier Patient Education  [place].

## 2020-08-17 NOTE — Progress Notes (Signed)
Valerie Key    [id number]    23-Nov-1960  Primary Care Physician:Saguier, Iris Pert  Referring Physician: Mackie Pai, PA-C [address],  [place] [address]  Chief complaint:   Patient being seen for snoring, daytime sleepiness  HPI:  Has been having daytime sleepiness, worsening recently Has been having issues with sleepiness at work, she works in the office and sometimes remotely  Usually goes to bed about 9 to 9:30 PM Falls asleep in about 15 minutes 3-4 awakenings Usually wakes about 4:30 AM Weight is up about 10 pounds nose  History of hypertension, asthma, emphysema, hypercholesterolemia  Admits to snoring, no witnessed apneas No night sweats Admits to some dryness No headaches  Memory is good  Dad has sleep apnea so does her brother and sister  Smokes about 6 cigarettes a day   Outpatient Encounter Medications as of 08/17/2020  Medication Sig  . ALPRAZolam (XANAX) 0.5 MG tablet Take 0.25 mg by mouth daily as needed for anxiety.   Marland Kitchen aspirin EC 81 MG tablet Take 1 tablet (81 mg total) by mouth daily. Swallow whole.  . budesonide-formoterol (SYMBICORT) 160-4.5 MCG/ACT inhaler Take 2 puffs first thing in am and then another 2 puffs about 12 hours later.  . Cholecalciferol (VITAMIN D3) 125 MCG (5000 UT) CAPS Take 1 capsule by mouth daily.  . citalopram (CELEXA) 20 MG tablet Take 1 tablet (20 mg total) by mouth daily.  . Coenzyme Q10 10 MG capsule Take 10 mg by mouth daily.  . hydrochlorothiazide (HYDRODIURIL) 12.5 MG tablet Take 1 tablet (12.5 mg total) by mouth daily.  Marland Kitchen HYDROcodone-acetaminophen (NORCO) 10-325 MG tablet Take 1 tablet by mouth every 4 (four) hours as needed.  Marland Kitchen levocetirizine (XYZAL) 5 MG tablet Take 1 tablet (5 mg total) by mouth at bedtime.  . nicotine (NICODERM CQ - DOSED IN MG/24 HOURS) 21 mg/24hr patch Place 1 patch (21 mg total) onto the skin daily.  . pantoprazole (PROTONIX) 40 MG tablet  TAKE 1 TABLET BY MOUTH EVERY DAY  . PROAIR HFA 108 (90 Base) MCG/ACT inhaler Inhale 1 puff into the lungs every 4 (four) hours as needed for wheezing or shortness of breath.  . rosuvastatin (CRESTOR) 10 MG tablet Take 10 mg by mouth every evening.  Marland Kitchen SPIRIVA RESPIMAT 2.5 MCG/ACT AERS INHALE 2 PUFFS BY MOUTH INTO THE LUNGS DAILY  . vitamin E (VITAMIN E) 400 UNIT capsule Take 2 capsules (800 Units total) by mouth daily.   No facility-administered encounter medications on file as of 08/17/2020.    Allergies as of 08/17/2020 - Review Complete 08/17/2020  Allergen Reaction Noted  . Sulfa antibiotics Hives and Rash 02/12/2012    Past Medical History:  Diagnosis Date  . Allergy    SEASONAL  . Anxiety   . Arthritis   . Asthma   . Back pain   . Chronic pain syndrome   . COPD (chronic obstructive pulmonary disease) ([place])   . Depression   . Diverticulosis   . Fatty liver   . Gastritis   . GERD (gastroesophageal reflux disease)   . Hiatal hernia   . History of kidney stones   . Hyperlipidemia     Past Surgical History:  Procedure Laterality Date  . ABDOMINAL HYSTERECTOMY     with bladder tact  . BLADDER TACK    . CHOLECYSTECTOMY N/A 07/26/2017   Procedure: LAPAROSCOPIC CHOLECYSTECTOMY;  Surgeon: Clovis Riley, MD;  Location: [facility];  Service: General;  Laterality: N/A;    Family History  Problem Relation Age of Onset  . Diabetes Father   . Colon cancer Paternal Uncle        x 2  . Heart disease Maternal Grandfather   . Heart disease Paternal Grandfather     Social History   Socioeconomic History  . Marital status: Divorced    Spouse name: Not on file  . Number of children: 2  . Years of education: Not on file  . Highest education level: Not on file  Occupational History  . Occupation: Armed forces operational officer: [employer]  Tobacco Use  . Smoking status: Current Every Day Smoker    Packs/day: 0.50    Years: 39.00    Pack years: 19.50    Types:  Cigarettes  . Smokeless tobacco: Never Used  . Tobacco comment: 6 ciggs daily-AH-08/17/2020  Vaping Use  . Vaping Use: Never used  Substance and Sexual Activity  . Alcohol use: Yes    Comment: 1-2 daily  . Drug use: No  . Sexual activity: Yes    Birth control/protection: Surgical  Other Topics Concern  . Not on file  Social History Narrative  . Not on file   Social Determinants of Health   Financial Resource Strain: Not on file  Food Insecurity: Not on file  Transportation Needs: Not on file  Physical Activity: Not on file  Stress: Not on file  Social Connections: Not on file  Intimate Partner Violence: Not on file    Review of Systems  Constitutional: Negative for diaphoresis.  Respiratory: Negative for shortness of breath.   Psychiatric/Behavioral: Positive for sleep disturbance.    Vitals:   08/17/20 1058  BP: 112/72  Pulse: 94  Temp: (!) 97.2 F (36.2 C)  SpO2: 96%     Physical Exam Constitutional:      Appearance: Normal appearance.  HENT:     Mouth/Throat:     Mouth: Mucous membranes are moist.     Comments: Mallampati 3, crowded oropharynx Eyes:     General:        Right eye: No discharge.     Pupils: Pupils are equal, round, and reactive to light.  Cardiovascular:     Rate and Rhythm: Normal rate and regular rhythm.     Heart sounds: No murmur heard. No friction rub.  Pulmonary:     Effort: No respiratory distress.     Breath sounds: No stridor. No wheezing or rhonchi.  Musculoskeletal:     Cervical back: No rigidity.  Neurological:     Mental Status: She is alert.  Psychiatric:        Mood and Affect: Mood normal.   Epworth Sleepiness Scale of 11  Assessment:  Excessive daytime sleepiness  Snoring  Moderate probability of significant obstructive sleep apnea  Pathophysiology of sleep disordered breathing discussed with the patient Treatment options for sleep disordered breathing discussed with the  patient  Plan/Recommendations: Schedule patient for home sleep study  Encouraged to start exercising on a regular basis Avoid supine sleep as possible  Tentative follow-up in about 3 to 4 months Encouraged to call with any significant concerns   Sherrilyn Rist MD [facility] 08/17/2020, 11:19 AM  CC: Mackie Pai, PA-C

## 2020-09-07 ENCOUNTER — Ambulatory Visit: Payer: Commercial Managed Care - PPO

## 2020-09-07 ENCOUNTER — Other Ambulatory Visit: Payer: Self-pay

## 2020-09-07 DIAGNOSIS — R0683 Snoring: Secondary | ICD-10-CM

## 2020-09-07 DIAGNOSIS — G4733 Obstructive sleep apnea (adult) (pediatric): Secondary | ICD-10-CM

## 2020-09-13 ENCOUNTER — Other Ambulatory Visit: Payer: Self-pay | Admitting: Internal Medicine

## 2020-09-13 ENCOUNTER — Telehealth: Payer: Self-pay | Admitting: Pulmonary Disease

## 2020-09-13 DIAGNOSIS — G4733 Obstructive sleep apnea (adult) (pediatric): Secondary | ICD-10-CM

## 2020-09-13 MED ORDER — SPIRIVA RESPIMAT 2.5 MCG/ACT IN AERS: INHALATION_SPRAY | RESPIRATORY_TRACT | 5 refills | Status: DC

## 2020-09-13 NOTE — Telephone Encounter (Signed)
Call patient  Sleep study result  Date of study: 09/07/2020  Impression: Mild obstructive sleep apnea Moderate oxygen desaturations  Recommendation: Options of treatment for mild obstructive sleep apnea will include  CPAP therapy may be considered as there is significant daytime sleepiness -Auto titrating CPAP with pressure settings of 5-15 will be appropriate if CPAP chosen as a option of treatment  An oral device may be considered as an option of treatment  Encourage weight loss measures  Clinical follow-up as previously scheduled

## 2020-09-14 NOTE — Telephone Encounter (Signed)
Called and spoke with patient to go over sleep study results. Patient expressed understanding and states she would like to proceed with CPAP machine. Advised her that we would put in order for machine and they would reach out to her once they got it and then for her to call the office 31-90 days once she physically got machine. Patient expressed understanding. Order placed. Nothing further needed at this time.

## 2020-10-12 ENCOUNTER — Other Ambulatory Visit: Payer: Self-pay | Admitting: Medical

## 2020-10-27 ENCOUNTER — Encounter: Payer: Self-pay | Admitting: Medical

## 2020-10-27 ENCOUNTER — Telehealth: Payer: Self-pay | Admitting: Medical

## 2020-10-27 ENCOUNTER — Ambulatory Visit (HOSPITAL_BASED_OUTPATIENT_CLINIC_OR_DEPARTMENT_OTHER)
Admission: RE | Admit: 2020-10-27 | Discharge: 2020-10-27 | Disposition: A | Discharge status: Home / Self Care | Payer: Commercial Managed Care - PPO | Source: Ambulatory Visit | Attending: Medical | Admitting: Medical

## 2020-10-27 ENCOUNTER — Ambulatory Visit (INDEPENDENT_AMBULATORY_CARE_PROVIDER_SITE_OTHER): Payer: Commercial Managed Care - PPO | Admitting: Medical

## 2020-10-27 ENCOUNTER — Other Ambulatory Visit: Payer: Self-pay

## 2020-10-27 VITALS — BP 122/82 | HR 90 | Temp 98.2°F | Resp 17 | Ht 64.0 in | Wt 147.0 lb

## 2020-10-27 DIAGNOSIS — I2510 Atherosclerotic heart disease of native coronary artery without angina pectoris: Secondary | ICD-10-CM

## 2020-10-27 DIAGNOSIS — I10 Essential (primary) hypertension: Secondary | ICD-10-CM

## 2020-10-27 DIAGNOSIS — G894 Chronic pain syndrome: Secondary | ICD-10-CM | POA: Diagnosis not present

## 2020-10-27 DIAGNOSIS — E785 Hyperlipidemia, unspecified: Secondary | ICD-10-CM | POA: Diagnosis not present

## 2020-10-27 DIAGNOSIS — M25562 Pain in left knee: Secondary | ICD-10-CM | POA: Diagnosis present

## 2020-10-27 DIAGNOSIS — J449 Chronic obstructive pulmonary disease, unspecified: Secondary | ICD-10-CM

## 2020-10-27 DIAGNOSIS — M545 Low back pain, unspecified: Secondary | ICD-10-CM

## 2020-10-27 DIAGNOSIS — G8929 Other chronic pain: Secondary | ICD-10-CM

## 2020-10-27 LAB — COMPREHENSIVE METABOLIC PANEL
ALT: 15 U/L (ref 0–35)
AST: 18 U/L (ref 0–37)
Albumin: 4.3 g/dL (ref 3.5–5.2)
Alkaline Phosphatase: 78 U/L (ref 39–117)
BUN: 11 mg/dL (ref 6–23)
CO2: 37 mEq/L — ABNORMAL HIGH (ref 19–32)
Calcium: 9.6 mg/dL (ref 8.4–10.5)
Chloride: 98 mEq/L (ref 96–112)
Creatinine, Ser: 0.76 mg/dL (ref 0.40–1.20)
GFR: 86.68 mL/min (ref >60.00)
Glucose, Bld: 89 mg/dL (ref 70–99)
Potassium: 3.6 mEq/L (ref 3.5–5.1)
Sodium: 143 mEq/L (ref 135–145)
Total Bilirubin: 0.4 mg/dL (ref 0.2–1.2)
Total Protein: 6.9 g/dL (ref 6.0–8.3)

## 2020-10-27 LAB — LDL CHOLESTEROL, DIRECT: Direct LDL: 63 mg/dL

## 2020-10-27 LAB — LIPID PANEL
Cholesterol: 146 mg/dL (ref 0–200)
HDL: 50.1 mg/dL (ref >39.00)
NonHDL: 96.02
Total CHOL/HDL Ratio: 3
Triglycerides: 281 mg/dL — ABNORMAL HIGH (ref 0.0–149.0)
VLDL: 56.2 mg/dL — ABNORMAL HIGH (ref 0.0–40.0)

## 2020-10-27 MED ORDER — FENOFIBRATE 48 MG PO TABS: 48.0000 mg | ORAL_TABLET | Freq: Every day | ORAL | 3 refills | Status: DC

## 2020-10-27 MED ORDER — DICLOFENAC SODIUM 75 MG PO TBEC: 75.0000 mg | DELAYED_RELEASE_TABLET | Freq: Two times a day (BID) | ORAL | 0 refills | Status: DC

## 2020-10-27 NOTE — Telephone Encounter (Signed)
Rx fenofibrate sent to pt pharmacy.

## 2020-10-27 NOTE — Progress Notes (Signed)
Subjective:    Patient ID: Valerie Key, female    DOB: 08/30/1960, 60 y.o.   MRN: [id number]  HPI  Pt in with left knee pain. States pain when goes up and down stairs hurts. If walking on flat surface. States pain is severe when walking stairs. Pain level is about 8/10. Pt had pain for about 3 weeks.  Pt does see pain management for chronic back pain. She is on norco.  Hx of htn. Pt is on hctz.  Pt has high cholesterol. Is on crestor 10 mg daily.  Copd hx. On Spiriva and proair.  Anxiety and depression controlled on celexa.  Pt continue to smoke but less. Now less than 1/2 pack a day.   Review of Systems  Constitutional: Negative for chills, fatigue and fever.  Respiratory: Negative for cough, chest tightness, shortness of breath and wheezing.   Cardiovascular: Negative for chest pain and palpitations.  Gastrointestinal: Negative for abdominal pain.  Musculoskeletal: Negative for back pain.       Left knee pain.  Skin: Negative for rash.  Neurological: Negative for dizziness, speech difficulty, weakness, numbness and headaches.  Hematological: Negative for adenopathy. Does not bruise/bleed easily.  Psychiatric/Behavioral: Negative for confusion and suicidal ideas. The patient is not nervous/anxious.     Past Medical History:  Diagnosis Date  . Allergy    SEASONAL  . Anxiety   . Arthritis   . Asthma   . Back pain   . Chronic pain syndrome   . COPD (chronic obstructive pulmonary disease) ([place])   . Depression   . Diverticulosis   . Fatty liver   . Gastritis   . GERD (gastroesophageal reflux disease)   . Hiatal hernia   . History of kidney stones   . Hyperlipidemia      Social History   Socioeconomic History  . Marital status: Divorced    Spouse name: Not on file  . Number of children: 2  . Years of education: Not on file  . Highest education level: Not on file  Occupational History  . Occupation: Armed forces operational officer: [employer]  Tobacco Use  . Smoking status: Current Every Day Smoker    Packs/day: 0.50    Years: 39.00    Pack years: 19.50    Types: Cigarettes  . Smokeless tobacco: Never Used  . Tobacco comment: 6 ciggs daily-AH-08/17/2020  Vaping Use  . Vaping Use: Never used  Substance and Sexual Activity  . Alcohol use: Yes    Comment: 1-2 daily  . Drug use: No  . Sexual activity: Yes    Birth control/protection: Surgical  Other Topics Concern  . Not on file  Social History Narrative  . Not on file   Social Determinants of Health   Financial Resource Strain: Not on file  Food Insecurity: Not on file  Transportation Needs: Not on file  Physical Activity: Not on file  Stress: Not on file  Social Connections: Not on file  Intimate Partner Violence: Not on file    Past Surgical History:  Procedure Laterality Date  . ABDOMINAL HYSTERECTOMY     with bladder tact  . BLADDER TACK    . CHOLECYSTECTOMY N/A 07/26/2017   Procedure: LAPAROSCOPIC CHOLECYSTECTOMY;  Surgeon: Clovis Riley, MD;  Location: [facility];  Service: General;  Laterality: N/A;    Family History  Problem Relation Age of Onset  . Diabetes Father   . Colon cancer Paternal Uncle  x 2  . Heart disease Maternal Grandfather   . Heart disease Paternal Grandfather     Allergies  Allergen Reactions  . Sulfa Antibiotics Hives and Rash    Current Outpatient Medications on File Prior to Visit  Medication Sig Dispense Refill  . ALPRAZolam (XANAX) 0.5 MG tablet Take 0.25 mg by mouth daily as needed for anxiety.     Marland Kitchen aspirin EC 81 MG tablet Take 1 tablet (81 mg total) by mouth daily. Swallow whole. 90 tablet 3  . budesonide-formoterol (SYMBICORT) 160-4.5 MCG/ACT inhaler Take 2 puffs first thing in am and then another 2 puffs about 12 hours later. 1 Inhaler 3  . Cholecalciferol (VITAMIN D3) 125 MCG (5000 UT) CAPS Take 1 capsule by mouth daily.    . citalopram (CELEXA) 20 MG tablet TAKE 1 TABLET BY MOUTH EVERY DAY 30  tablet 3  . Coenzyme Q10 10 MG capsule Take 10 mg by mouth daily.    . hydrochlorothiazide (HYDRODIURIL) 12.5 MG tablet Take 1 tablet (12.5 mg total) by mouth daily. 90 tablet 3  . HYDROcodone-acetaminophen (NORCO) 10-325 MG tablet Take 1 tablet by mouth every 4 (four) hours as needed.    Marland Kitchen levocetirizine (XYZAL) 5 MG tablet Take 1 tablet (5 mg total) by mouth at bedtime. 90 tablet 3  . nicotine (NICODERM CQ - DOSED IN MG/24 HOURS) 21 mg/24hr patch Place 1 patch (21 mg total) onto the skin daily. 28 patch 0  . pantoprazole (PROTONIX) 40 MG tablet TAKE 1 TABLET BY MOUTH EVERY DAY 30 tablet 11  . PROAIR HFA 108 (90 Base) MCG/ACT inhaler Inhale 1 puff into the lungs every 4 (four) hours as needed for wheezing or shortness of breath. 1 Inhaler 3  . rosuvastatin (CRESTOR) 10 MG tablet Take 10 mg by mouth every evening.    . Tiotropium Bromide Monohydrate (SPIRIVA RESPIMAT) 2.5 MCG/ACT AERS INHALE 2 PUFFS BY MOUTH INTO THE LUNGS DAILY 4 g 5  . vitamin E (VITAMIN E) 400 UNIT capsule Take 2 capsules (800 Units total) by mouth daily. 60 capsule 2   No current facility-administered medications on file prior to visit.    BP 122/82 (BP Location: Right Arm, Patient Position: Sitting, Cuff Size: Normal)   Pulse 90   Temp 98.2 F (36.8 C)   Resp 17   Ht 5\' 4"  (1.626 m)   Wt 147 lb (66.7 kg)   SpO2 94%   BMI 25.23 kg/m       Objective:   Physical Exam  General Mental Status- Alert. General Appearance- Not in acute distress.   Skin General: Color- Normal Color. Moisture- Normal Moisture.  Neck Carotid Arteries- Normal color. Moisture- Normal Moisture. No carotid bruits. No JVD.  Chest and Lung Exam Auscultation: Breath Sounds:-Normal.  Cardiovascular Auscultation:Rythm- Regular. Murmurs & Other Heart Sounds:Auscultation of the heart reveals- No Murmurs.  Abdomen Inspection:-Inspeection Normal. Palpation/Percussion:Note:No mass. Palpation and Percussion of the abdomen reveal- Non  Tender, Non Distended + BS, no rebound or guarding.   Neurologic Cranial Nerve exam:- CN III-XII intact(No nystagmus), symmetric smile. Strength:- 5/5 equal and symmetric strength both upper and lower extremities.  Left knee- crepitus on flexion and extension. Pain on stepping up to exam table. Pain on medial tibial plateau region.    Assessment & Plan:  Left knee pain x3 weeks.  Will get x-ray today to assess joint space.  If significant degenerative changes then refer to orthopedics.  If study is normal consider referral to sports medicine for possible ultrasound to  evaluate cartilage Prescribed diclofenac for pain inflammation.  Rx advisement given.  Hypertension well controlled today.  Continue HCTZ.  History of COPD and no flareup presently.  Continue on Spiriva and albuterol.  Chronic back pain/pain syndrome.  Patient seeing pain management and is on Norco.  History of hyperlipidemia and on Crestor.  We will get metabolic panel and lipid panel today.  Make adjustments if necessary.  History of smoking and encouraged to stop.  Follow-up date to be determined after lab and x-ray review.  Mackie Pai, PA-C

## 2020-10-27 NOTE — Patient Instructions (Signed)
Left knee pain x3 weeks.  Will get x-ray today to assess joint space.  If significant degenerative changes then refer to orthopedics.  If study is normal consider referral to sports medicine for possible ultrasound to evaluate cartilage Prescribed diclofenac for pain inflammation.  Rx advisement given.  Hypertension well controlled today.  Continue HCTZ.  History of COPD and no flareup presently.  Continue on Spiriva and albuterol.  Chronic back pain/pain syndrome.  Patient seeing pain management and is on Norco.  History of hyperlipidemia and on Crestor.  We will get metabolic panel and lipid panel today.  Make adjustments if necessary.  History of smoking and encouraged to stop.  Follow-up date to be determined after lab and x-ray review.

## 2020-10-27 NOTE — Addendum Note (Signed)
Addended by: Anabel Halon on: 10/27/2020 09:54 AM   Modules accepted: Orders

## 2020-11-02 ENCOUNTER — Other Ambulatory Visit: Payer: Self-pay

## 2020-11-02 MED ORDER — BUDESONIDE-FORMOTEROL FUMARATE 160-4.5 MCG/ACT IN AERO: INHALATION_SPRAY | RESPIRATORY_TRACT | 3 refills | Status: DC

## 2020-11-08 ENCOUNTER — Encounter: Payer: Self-pay | Admitting: Family Medicine

## 2020-11-08 ENCOUNTER — Ambulatory Visit: Payer: Commercial Managed Care - PPO

## 2020-11-08 ENCOUNTER — Other Ambulatory Visit: Payer: Self-pay

## 2020-11-08 ENCOUNTER — Ambulatory Visit: Payer: Commercial Managed Care - PPO | Admitting: Family Medicine

## 2020-11-08 VITALS — BP 150/82 | Ht 64.0 in | Wt 147.0 lb

## 2020-11-08 DIAGNOSIS — M25562 Pain in left knee: Secondary | ICD-10-CM

## 2020-11-08 DIAGNOSIS — M222X2 Patellofemoral disorders, left knee: Secondary | ICD-10-CM

## 2020-11-08 NOTE — Patient Instructions (Signed)
Nice to meet you Please try ice as needed  Please try the exercises   Please send me a message in MyChart with any questions or updates.  Please see me back in 4 weeks or as needed if better.   --Dr. Raeford Razor

## 2020-11-08 NOTE — Assessment & Plan Note (Signed)
Does have effusion on exam but symptoms seem more consistent with patellofemoral pain. -Counseled on home exercise therapy and supportive care. -Counseled on diclofenac. -Could consider aspiration of synovial fluid analysis.  Could consider physical therapy.

## 2020-11-08 NOTE — Progress Notes (Signed)
Trachelle Key - 60 y.o. female MRN [id number]  Date of birth: Jun 17, 1960  SUBJECTIVE:  Including CC & ROS.  No chief complaint on file.   Valerie Key is a 60 y.o. female that is presenting with left knee pain.  The pain is been ongoing for a few weeks.  Has gotten improvement with past few days with the medication.  Denies any injury or inciting event.  Pain was worse going up and down stairs..  Independent review of the left knee x-ray from 5/26 shows no significant degenerative changes.   Review of Systems See HPI   HISTORY: Past Medical, Surgical, Social, and Family History Reviewed & Updated per EMR.   Pertinent Historical Findings include:  Past Medical History:  Diagnosis Date  . Allergy    SEASONAL  . Anxiety   . Arthritis   . Asthma   . Back pain   . Chronic pain syndrome   . COPD (chronic obstructive pulmonary disease) ([place])   . Depression   . Diverticulosis   . Fatty liver   . Gastritis   . GERD (gastroesophageal reflux disease)   . Hiatal hernia   . History of kidney stones   . Hyperlipidemia     Past Surgical History:  Procedure Laterality Date  . ABDOMINAL HYSTERECTOMY     with bladder tact  . BLADDER TACK    . CHOLECYSTECTOMY N/A 07/26/2017   Procedure: LAPAROSCOPIC CHOLECYSTECTOMY;  Surgeon: Clovis Riley, MD;  Location: [facility];  Service: General;  Laterality: N/A;    Family History  Problem Relation Age of Onset  . Diabetes Father   . Colon cancer Paternal Uncle        x 2  . Heart disease Maternal Grandfather   . Heart disease Paternal Grandfather     Social History   Socioeconomic History  . Marital status: Divorced    Spouse name: Not on file  . Number of children: 2  . Years of education: Not on file  . Highest education level: Not on file  Occupational History  . Occupation: Armed forces operational officer: [employer]  Tobacco Use  . Smoking status: Current Every Day Smoker    Packs/day: 0.50     Years: 39.00    Pack years: 19.50    Types: Cigarettes  . Smokeless tobacco: Never Used  . Tobacco comment: 6 ciggs daily-AH-08/17/2020  Vaping Use  . Vaping Use: Never used  Substance and Sexual Activity  . Alcohol use: Yes    Comment: 1-2 daily  . Drug use: No  . Sexual activity: Yes    Birth control/protection: Surgical  Other Topics Concern  . Not on file  Social History Narrative  . Not on file   Social Determinants of Health   Financial Resource Strain: Not on file  Food Insecurity: Not on file  Transportation Needs: Not on file  Physical Activity: Not on file  Stress: Not on file  Social Connections: Not on file  Intimate Partner Violence: Not on file     PHYSICAL EXAM:  VS: BP (!) 150/82 (BP Location: Left Arm, Patient Position: Sitting, Cuff Size: Normal)   Ht 5\' 4"  (1.626 m)   Wt 147 lb (66.7 kg)   BMI 25.23 kg/m  Physical Exam Gen: NAD, alert, cooperative with exam, well-appearing MSK:  Left knee: Normal range of motion. No effusion. Normal strength resistance. Neurovascular intact  Limited ultrasound: Left knee:  Mild to moderate effusion. Normal-appearing quadricep  patellar tendon. Mild medial meniscus degenerative changes. Fairly normal lateral joint space. Hyperechoic lucencies appreciated within the effusion  Summary: Degenerative changes of the medial meniscus.  Ultrasound and interpretation by Clearance Coots, MD    ASSESSMENT & PLAN:   Patellofemoral pain syndrome of left knee Does have effusion on exam but symptoms seem more consistent with patellofemoral pain. -Counseled on home exercise therapy and supportive care. -Counseled on diclofenac. -Could consider aspiration of synovial fluid analysis.  Could consider physical therapy.

## 2021-01-31 ENCOUNTER — Other Ambulatory Visit: Payer: Self-pay | Admitting: Medical

## 2021-02-02 ENCOUNTER — Encounter: Payer: Self-pay | Admitting: Internal Medicine

## 2021-02-02 ENCOUNTER — Telehealth: Payer: Self-pay | Admitting: Internal Medicine

## 2021-02-02 MED ORDER — ALBUTEROL SULFATE HFA 108 (90 BASE) MCG/ACT IN AERS: 1.0000 | INHALATION_SPRAY | RESPIRATORY_TRACT | 3 refills | Status: DC | PRN

## 2021-02-02 NOTE — Telephone Encounter (Signed)
Called and spoke with patient. She was calling to request a refill on her albuterol inhaler. She wishes to have this sent to [facility] [address]. I advised her that I would go ahead and send this in for her. She verbalized understanding.   Nothing further needed at time of call.

## 2021-02-08 ENCOUNTER — Telehealth: Payer: Self-pay | Admitting: Medical

## 2021-02-08 MED ORDER — ROSUVASTATIN CALCIUM 10 MG PO TABS
10.0000 mg | ORAL_TABLET | Freq: Every evening | ORAL | 2 refills | Status: DC
Start: 2021-02-08 — End: 2021-10-23

## 2021-02-08 NOTE — Telephone Encounter (Signed)
Medication: rosuvastatin (CRESTOR) 10 MG tablet  Has the patient contacted their pharmacy? No. (If no, request that the patient contact the pharmacy for the refill.) (If yes, when and what did the pharmacy advise?)  Preferred Pharmacy (with phone number or street name): [id number] [address] [id number], [id number] Phone:  [phone] Fax:  [phone]  Agent: Please be advised that RX refills may take up to 3 business days. We ask that you follow-up with your pharmacy.

## 2021-02-08 NOTE — Telephone Encounter (Signed)
Rx sent 

## 2021-02-15 ENCOUNTER — Other Ambulatory Visit: Payer: Self-pay | Admitting: Medical

## 2021-02-24 ENCOUNTER — Ambulatory Visit: Payer: Commercial Managed Care - PPO | Admitting: Medical

## 2021-02-26 ENCOUNTER — Other Ambulatory Visit: Payer: Self-pay | Admitting: Medical

## 2021-03-02 ENCOUNTER — Encounter: Payer: Self-pay | Admitting: Medical

## 2021-03-02 ENCOUNTER — Ambulatory Visit: Payer: Commercial Managed Care - PPO | Admitting: Medical

## 2021-03-02 ENCOUNTER — Other Ambulatory Visit: Payer: Self-pay

## 2021-03-02 VITALS — BP 135/70 | HR 96 | Resp 18 | Ht 64.0 in | Wt 149.6 lb

## 2021-03-02 DIAGNOSIS — J449 Chronic obstructive pulmonary disease, unspecified: Secondary | ICD-10-CM

## 2021-03-02 DIAGNOSIS — R739 Hyperglycemia, unspecified: Secondary | ICD-10-CM | POA: Diagnosis not present

## 2021-03-02 DIAGNOSIS — F17210 Nicotine dependence, cigarettes, uncomplicated: Secondary | ICD-10-CM

## 2021-03-02 DIAGNOSIS — E785 Hyperlipidemia, unspecified: Secondary | ICD-10-CM | POA: Diagnosis not present

## 2021-03-02 DIAGNOSIS — I10 Essential (primary) hypertension: Secondary | ICD-10-CM | POA: Diagnosis not present

## 2021-03-02 DIAGNOSIS — Z23 Encounter for immunization: Secondary | ICD-10-CM

## 2021-03-02 LAB — COMPREHENSIVE METABOLIC PANEL
ALT: 18 U/L (ref 0–35)
AST: 18 U/L (ref 0–37)
Albumin: 4.3 g/dL (ref 3.5–5.2)
Alkaline Phosphatase: 62 U/L (ref 39–117)
BUN: 11 mg/dL (ref 6–23)
CO2: 37 mEq/L — ABNORMAL HIGH (ref 19–32)
Calcium: 9.7 mg/dL (ref 8.4–10.5)
Chloride: 99 mEq/L (ref 96–112)
Creatinine, Ser: 0.81 mg/dL (ref 0.40–1.20)
GFR: 80.1 mL/min (ref >60.00)
Glucose, Bld: 89 mg/dL (ref 70–99)
Potassium: 3.9 mEq/L (ref 3.5–5.1)
Sodium: 144 mEq/L (ref 135–145)
Total Bilirubin: 0.4 mg/dL (ref 0.2–1.2)
Total Protein: 6.9 g/dL (ref 6.0–8.3)

## 2021-03-02 LAB — LIPID PANEL
Cholesterol: 150 mg/dL (ref 0–200)
HDL: 59.3 mg/dL (ref >39.00)
LDL Cholesterol: 58 mg/dL (ref 0–99)
NonHDL: 90.95
Total CHOL/HDL Ratio: 3
Triglycerides: 163 mg/dL — ABNORMAL HIGH (ref 0.0–149.0)
VLDL: 32.6 mg/dL (ref 0.0–40.0)

## 2021-03-02 LAB — HEMOGLOBIN A1C: Hgb A1c MFr Bld: 6.2 % (ref 4.6–6.5)

## 2021-03-02 MED ORDER — NICOTINE 21 MG/24HR TD PT24: 21.0000 mg | MEDICATED_PATCH | Freq: Every day | TRANSDERMAL | 3 refills | Status: DC

## 2021-03-02 NOTE — Patient Instructions (Addendum)
Hx of high cholesterol. On crestor and fenofibrate. Lipid panel and cmp today.  Htn hx. Bp high on first check but on recheck much better. Check at home 2-3 times a week. Want to verify staying controlled. Better closer to 130/80. If above 140/90 let us know. Continue hctz.  Elevated sugar on occasion in past. Check a1c.  Smoking hx but recently decreased 2 pack a day down to 1/2 pack a day. Good job continue nicotine patch and try to taper off further. Refill patches today.  Copd. Continue using inhalers rx'd by pulmonologist.  For back pain chronic continue with pain management.  For gerd continue on protonix. Well controlled.

## 2021-03-02 NOTE — Progress Notes (Signed)
Subjective:    Patient ID: Valerie Key, female    DOB: Feb 26, 1961, 60 y.o.   MRN: [id number]  HPI   Pt in for follow up.  Hx of high cholesterol. Pt is on crestor 10 mg daily and fenofibrate 48 mg daily. Pt describes moderate healthy diet. Not exercising.  Pt has gerd history. Well controlled on protonix.   Pt is still smoking. Pt has smoked since 60 yo.(With nicoderm she has cut down from 2 packs a day to just 1/2 pack a day) Pt has ct chest screening order pending. Her pulmonlogist ordered. Also dx of copd. Pt is on spiriva and albuterol.    Htn-  Pt bp high initially. She is on hctz 12.5 mg daily. Also on review 11-08-2020 bp was elevated.   Hx of chronic back pain. Pt sees [facility] neurologist. She got injection for pain last week. Still having pain. Pt has narcotic if needed per pain management.   Review of Systems  Constitutional:  Negative for chills, fatigue and fever.  HENT:  Negative for congestion.   Respiratory:  Negative for cough, chest tightness, shortness of breath and wheezing.   Cardiovascular:  Negative for chest pain and palpitations.  Gastrointestinal:  Negative for abdominal pain.  Genitourinary:  Negative for dysuria.  Musculoskeletal:  Positive for back pain.  Neurological:  Negative for dizziness, speech difficulty, weakness and headaches.  Hematological:  Negative for adenopathy. Does not bruise/bleed easily.    Past Medical History:  Diagnosis Date   Allergy    SEASONAL   Anxiety    Arthritis    Asthma    Back pain    Chronic pain syndrome    COPD (chronic obstructive pulmonary disease) ([facility])    Depression    Diverticulosis    Fatty liver    Gastritis    GERD (gastroesophageal reflux disease)    Hiatal hernia    History of kidney stones    Hyperlipidemia      Social History   Socioeconomic History   Marital status: Divorced    Spouse name: Not on file   Number of children: 2   Years of education: Not on file    Highest education level: Not on file  Occupational History   Occupation: clerk    Employer: [employer]  Tobacco Use   Smoking status: Every Day    Packs/day: 0.50    Years: 39.00    Pack years: 19.50    Types: Cigarettes   Smokeless tobacco: Never   Tobacco comments:    6 ciggs daily-AH-08/17/2020  Vaping Use   Vaping Use: Never used  Substance and Sexual Activity   Alcohol use: Yes    Comment: 1-2 daily   Drug use: No   Sexual activity: Yes    Birth control/protection: Surgical  Other Topics Concern   Not on file  Social History Narrative   Not on file   Social Determinants of Health   Financial Resource Strain: Not on file  Food Insecurity: Not on file  Transportation Needs: Not on file  Physical Activity: Not on file  Stress: Not on file  Social Connections: Not on file  Intimate Partner Violence: Not on file    Past Surgical History:  Procedure Laterality Date   ABDOMINAL HYSTERECTOMY     with bladder tact   BLADDER TACK     CHOLECYSTECTOMY N/A 07/26/2017   Procedure: LAPAROSCOPIC CHOLECYSTECTOMY;  Surgeon: Clovis Riley, MD;  Location: [facility];  Service: General;  Laterality: N/A;    Family History  Problem Relation Age of Onset   Diabetes Father    Colon cancer Paternal Uncle        x 2   Heart disease Maternal Grandfather    Heart disease Paternal Grandfather     Allergies  Allergen Reactions   Sulfa Antibiotics Hives and Rash    Current Outpatient Medications on File Prior to Visit  Medication Sig Dispense Refill   albuterol (PROAIR HFA) 108 (90 Base) MCG/ACT inhaler Inhale 1 puff into the lungs every 4 (four) hours as needed for wheezing or shortness of breath. 8 g 3   ALPRAZolam (XANAX) 0.5 MG tablet Take 0.25 mg by mouth daily as needed for anxiety.      aspirin EC 81 MG tablet Take 1 tablet (81 mg total) by mouth daily. Swallow whole. 90 tablet 3   budesonide-formoterol (SYMBICORT) 160-4.5 MCG/ACT inhaler Take 2 puffs first  thing in am and then another 2 puffs about 12 hours later. 1 each 3   Cholecalciferol (VITAMIN D3) 125 MCG (5000 UT) CAPS Take 1 capsule by mouth daily.     citalopram (CELEXA) 20 MG tablet TAKE 1 TABLET BY MOUTH EVERY DAY 30 tablet 3   Coenzyme Q10 10 MG capsule Take 10 mg by mouth daily.     diclofenac (VOLTAREN) 75 MG EC tablet Take 1 tablet (75 mg total) by mouth 2 (two) times daily. 20 tablet 0   fenofibrate (TRICOR) 48 MG tablet TAKE 1 TABLET BY MOUTH EVERY DAY 30 tablet 3   hydrochlorothiazide (HYDRODIURIL) 12.5 MG tablet Take 1 tablet (12.5 mg total) by mouth daily. 90 tablet 3   HYDROcodone-acetaminophen (NORCO) 10-325 MG tablet Take 1 tablet by mouth every 4 (four) hours as needed.     levocetirizine (XYZAL) 5 MG tablet TAKE 1 TABLET BY MOUTH EVERYDAY AT BEDTIME 90 tablet 3   nicotine (NICODERM CQ - DOSED IN MG/24 HOURS) 21 mg/24hr patch Place 1 patch (21 mg total) onto the skin daily. 28 patch 0   pantoprazole (PROTONIX) 40 MG tablet TAKE 1 TABLET BY MOUTH EVERY DAY 30 tablet 11   rosuvastatin (CRESTOR) 10 MG tablet Take 1 tablet (10 mg total) by mouth every evening. 90 tablet 2   Tiotropium Bromide Monohydrate (SPIRIVA RESPIMAT) 2.5 MCG/ACT AERS INHALE 2 PUFFS BY MOUTH INTO THE LUNGS DAILY 4 g 5   vitamin E (VITAMIN E) 400 UNIT capsule Take 2 capsules (800 Units total) by mouth daily. 60 capsule 2   No current facility-administered medications on file prior to visit.    BP (!) 158/86   Pulse 96   Resp 18   Ht 5\' 4"  (1.626 m)   Wt 149 lb 9.6 oz (67.9 kg)   SpO2 91%   BMI 25.68 kg/m       Objective:   Physical Exam  General Mental Status- Alert. General Appearance- Not in acute distress.   Skin General: Color- Normal Color. Moisture- Normal Moisture.   Chest and Lung Exam Auscultation: Breath Sounds:-Normal.  Cardiovascular Auscultation:Rythm- Regular. Murmurs & Other Heart Sounds:Auscultation of the heart reveals- No Murmurs.  Abdomen Inspection:-Inspeection  Normal. Palpation/Percussion:Note:No mass. Palpation and Percussion of the abdomen reveal- Non Tender, Non Distended + BS, no rebound or guarding.   Neurologic Cranial Nerve exam:- CN III-XII intact(No nystagmus), symmetric smile. Strength:- 5/5 equal and symmetric strength both upper and lower extremities.   Back- no obvious pain on light palpation. Some pain on rt leg straight  leg lift.     Assessment & Plan:     Patient Instructions  Hx of high cholesterol. On crestor and fenofibrate. Lipid panel and cmp today.  Htn hx. Bp high on first check but on recheck much better. Check at home 2-3 times a week. Want to verify staying controlled. Better closer to 130/80. If above 140/90 let us know. Continue hctz.  Elevated sugar on occasion in past. Check a1c.  Smoking hx but recently decreased 2 pack a day down to 1/2 pack a day. Good job continue nicotine patch and try to taper off further. Refill patches today.  Copd. Continue using inhalers rx'd by pulmonologist.  For back pain chronic continue with pain management.  For gerd continue on protonix. Well controlled.    Mackie Pai, PA-C

## 2021-03-16 ENCOUNTER — Ambulatory Visit: Payer: Commercial Managed Care - PPO | Admitting: Internal Medicine

## 2021-03-29 ENCOUNTER — Other Ambulatory Visit: Payer: Self-pay | Admitting: Internal Medicine

## 2021-04-11 ENCOUNTER — Other Ambulatory Visit: Payer: Self-pay

## 2021-04-11 DIAGNOSIS — F17210 Nicotine dependence, cigarettes, uncomplicated: Secondary | ICD-10-CM

## 2021-04-20 ENCOUNTER — Ambulatory Visit (HOSPITAL_BASED_OUTPATIENT_CLINIC_OR_DEPARTMENT_OTHER)
Admission: RE | Admit: 2021-04-20 | Discharge: 2021-04-20 | Disposition: A | Discharge status: Home / Self Care | Payer: Commercial Managed Care - PPO | Source: Ambulatory Visit | Attending: Acute Care | Admitting: Acute Care

## 2021-04-20 ENCOUNTER — Other Ambulatory Visit: Payer: Self-pay

## 2021-04-20 DIAGNOSIS — Z122 Encounter for screening for malignant neoplasm of respiratory organs: Secondary | ICD-10-CM | POA: Insufficient documentation

## 2021-04-20 DIAGNOSIS — I700 Atherosclerosis of aorta: Secondary | ICD-10-CM | POA: Insufficient documentation

## 2021-04-20 DIAGNOSIS — J432 Centrilobular emphysema: Secondary | ICD-10-CM | POA: Insufficient documentation

## 2021-04-20 DIAGNOSIS — F17210 Nicotine dependence, cigarettes, uncomplicated: Secondary | ICD-10-CM | POA: Diagnosis not present

## 2021-04-20 DIAGNOSIS — I2510 Atherosclerotic heart disease of native coronary artery without angina pectoris: Secondary | ICD-10-CM | POA: Diagnosis not present

## 2021-04-21 ENCOUNTER — Telehealth: Payer: Self-pay | Admitting: Acute Care

## 2021-04-21 DIAGNOSIS — J449 Chronic obstructive pulmonary disease, unspecified: Secondary | ICD-10-CM

## 2021-04-21 DIAGNOSIS — F17210 Nicotine dependence, cigarettes, uncomplicated: Secondary | ICD-10-CM

## 2021-04-21 NOTE — Telephone Encounter (Signed)
Call report from [facility]:  IMPRESSION: 1. New nodular density in the central aspect of the right lower lobe, favored to represent a focal area of mucoid impaction within a bronchus, but technically categorized as Lung-RADS 4AS, suspicious. Follow up low-dose chest CT without contrast in 3 months (please use the following order, "CT CHEST LCS NODULE FOLLOW-UP W/O CM") is recommended. 2. The "S" modifier above refers to potentially clinically significant non lung cancer related findings. Specifically, there is aortic atherosclerosis, in addition to left main and 2 vessel coronary artery disease. Please note that although the presence of coronary artery calcium documents the presence of coronary artery disease, the severity of this disease and any potential stenosis cannot be assessed on this non-gated CT examination. Assessment for potential risk factor modification, dietary therapy or pharmacologic therapy may be warranted, if clinically indicated. 3. Mild diffuse bronchial wall thickening with mild centrilobular and paraseptal emphysema; imaging findings suggestive of underlying COPD. 4. Morphologic changes in the liver indicative of underlying cirrhosis.   These results will be called to the ordering clinician or representative by the Radiologist Assistant, and communication documented in the PACS or [employer].   Aortic Atherosclerosis (ICD10-I70.0) and Emphysema (ICD10-J43.9).     Electronically Signed   By: Vinnie Langton M.D.   On: 04/21/2021 09:07

## 2021-04-24 NOTE — Telephone Encounter (Signed)
Spoke with the pt  She is coming tomorrow 04/25/21 to pick up the flutter device  I have advised her that someone will need to show her how to use this and have her sign [employer] form  I stapled form to bag with flutter and placed up front

## 2021-04-25 NOTE — Telephone Encounter (Signed)
Ct results faxed to PCP. Order placed for 3 mth nodule f/u Ct.

## 2021-04-25 NOTE — Addendum Note (Signed)
Addended by: Doroteo Glassman D on: 04/25/2021 08:29 AM   Modules accepted: Orders

## 2021-06-26 ENCOUNTER — Other Ambulatory Visit: Payer: Self-pay | Admitting: Medical

## 2021-06-27 ENCOUNTER — Other Ambulatory Visit: Payer: Self-pay | Admitting: Medical

## 2021-07-16 ENCOUNTER — Other Ambulatory Visit: Payer: Self-pay | Admitting: Internal Medicine

## 2021-07-22 ENCOUNTER — Telehealth (HOSPITAL_BASED_OUTPATIENT_CLINIC_OR_DEPARTMENT_OTHER): Payer: Self-pay

## 2021-07-25 ENCOUNTER — Other Ambulatory Visit: Payer: Self-pay | Admitting: Internal Medicine

## 2021-07-26 ENCOUNTER — Other Ambulatory Visit: Payer: Self-pay | Admitting: Physician Assistant

## 2021-08-03 ENCOUNTER — Other Ambulatory Visit (HOSPITAL_BASED_OUTPATIENT_CLINIC_OR_DEPARTMENT_OTHER): Payer: Self-pay | Admitting: Medical

## 2021-08-03 DIAGNOSIS — Z1231 Encounter for screening mammogram for malignant neoplasm of breast: Secondary | ICD-10-CM

## 2021-08-11 ENCOUNTER — Ambulatory Visit (HOSPITAL_BASED_OUTPATIENT_CLINIC_OR_DEPARTMENT_OTHER)
Admission: RE | Admit: 2021-08-11 | Discharge: 2021-08-11 | Disposition: A | Discharge status: Home / Self Care | Payer: Commercial Managed Care - PPO | Source: Ambulatory Visit | Attending: Acute Care | Admitting: Acute Care

## 2021-08-11 ENCOUNTER — Other Ambulatory Visit: Payer: Self-pay

## 2021-08-11 DIAGNOSIS — F17210 Nicotine dependence, cigarettes, uncomplicated: Secondary | ICD-10-CM | POA: Insufficient documentation

## 2021-08-14 ENCOUNTER — Other Ambulatory Visit: Payer: Self-pay

## 2021-08-14 ENCOUNTER — Telehealth: Payer: Self-pay | Admitting: Acute Care

## 2021-08-14 DIAGNOSIS — Z87891 Personal history of nicotine dependence: Secondary | ICD-10-CM

## 2021-08-14 DIAGNOSIS — F17210 Nicotine dependence, cigarettes, uncomplicated: Secondary | ICD-10-CM

## 2021-08-14 NOTE — Telephone Encounter (Signed)
Spoke with patient by phone to review results of CT chest.  Patient has reviewed results in mychart.  Aware of previous emphysema, atherosclerosis and fatty liver.  Recommendation to return to annual LDCT per radiology review.  No suspicious finding for lung cancer at this time.  Patient acknowledged understanding and had no questions.  Results faxed to PCP and annual LDCT order placed ?

## 2021-08-15 ENCOUNTER — Encounter (HOSPITAL_BASED_OUTPATIENT_CLINIC_OR_DEPARTMENT_OTHER): Payer: Self-pay

## 2021-08-15 ENCOUNTER — Ambulatory Visit (HOSPITAL_BASED_OUTPATIENT_CLINIC_OR_DEPARTMENT_OTHER)
Admission: RE | Admit: 2021-08-15 | Discharge: 2021-08-15 | Disposition: A | Discharge status: Home / Self Care | Payer: Commercial Managed Care - PPO | Source: Ambulatory Visit | Attending: Medical | Admitting: Medical

## 2021-08-15 ENCOUNTER — Other Ambulatory Visit: Payer: Self-pay

## 2021-08-15 DIAGNOSIS — Z1231 Encounter for screening mammogram for malignant neoplasm of breast: Secondary | ICD-10-CM | POA: Diagnosis present

## 2021-08-21 ENCOUNTER — Ambulatory Visit: Payer: Commercial Managed Care - PPO | Admitting: Medical

## 2021-08-21 VITALS — BP 130/70 | HR 100 | Resp 18 | Ht 64.0 in | Wt 151.0 lb

## 2021-08-21 DIAGNOSIS — E785 Hyperlipidemia, unspecified: Secondary | ICD-10-CM

## 2021-08-21 DIAGNOSIS — J449 Chronic obstructive pulmonary disease, unspecified: Secondary | ICD-10-CM

## 2021-08-21 DIAGNOSIS — F17210 Nicotine dependence, cigarettes, uncomplicated: Secondary | ICD-10-CM

## 2021-08-21 DIAGNOSIS — R739 Hyperglycemia, unspecified: Secondary | ICD-10-CM | POA: Diagnosis not present

## 2021-08-21 DIAGNOSIS — I10 Essential (primary) hypertension: Secondary | ICD-10-CM

## 2021-08-21 DIAGNOSIS — F32A Depression, unspecified: Secondary | ICD-10-CM

## 2021-08-21 LAB — COMPREHENSIVE METABOLIC PANEL
ALT: 16 U/L (ref 0–35)
AST: 20 U/L (ref 0–37)
Albumin: 4.2 g/dL (ref 3.5–5.2)
Alkaline Phosphatase: 58 U/L (ref 39–117)
BUN: 10 mg/dL (ref 6–23)
CO2: 35 mEq/L — ABNORMAL HIGH (ref 19–32)
Calcium: 9.6 mg/dL (ref 8.4–10.5)
Chloride: 100 mEq/L (ref 96–112)
Creatinine, Ser: 0.85 mg/dL (ref 0.40–1.20)
GFR: 75.35 mL/min (ref >60.00)
Glucose, Bld: 83 mg/dL (ref 70–99)
Potassium: 3.9 mEq/L (ref 3.5–5.1)
Sodium: 143 mEq/L (ref 135–145)
Total Bilirubin: 0.4 mg/dL (ref 0.2–1.2)
Total Protein: 6.6 g/dL (ref 6.0–8.3)

## 2021-08-21 LAB — HEMOGLOBIN A1C: Hgb A1c MFr Bld: 6.3 % (ref 4.6–6.5)

## 2021-08-21 MED ORDER — BUPROPION HCL 75 MG PO TABS: ORAL_TABLET | ORAL | 0 refills | Status: DC

## 2021-08-21 NOTE — Patient Instructions (Addendum)
COPD stable presently but still smoking pack a cigarette a day.  Continue Symbicort, Spiriva and albuterol.  For smoking cessation Wellbutrin 75 mg 1 tab daily.  We will see how you do with this and might increase dose in 1 month.  Rx advisement given regarding Wellbutrin.  Attaching serotonin syndrome information to AVS.  If you have such symptoms stop both Wellbutrin and Celexa. ? ?Depression stable.  Well-controlled with Celexa. ? ?Hyperlipidemia with coronary artery atherosclerosis on CT.  Nonfasting today/just ate some sausage not getting lipid panel.  But on review 5 months ago LDL was less than 70.  If you develop any degree of chest pain notify us and we will refer back to your cardiologist for ischemia work-up.  If any degree of significant chest pain as discussed then be seen in emergency department. ? ?Elevated [id number] and metabolic panel today. ? ?Hypertension-blood pressure well controlled on recheck.  Continue current BP medication. ? ?Follow-up in 1 month or sooner if needed. ? ? ? ? ?Serotonin Syndrome ?Serotonin is a chemical in your body (neurotransmitter) that helps to control several functions, such as: ?Brain and nerve cell function. ?Mood and emotions. ?Memory. ?Eating. ?Sleeping. ?Sexual activity. ?Stress response. ?Having too much serotonin in your body can cause serotonin syndrome. This condition can be harmful to your brain and nerve cells. This can be a life-threatening condition. ?What are the causes? ?This condition may be caused by taking medicines or drugs that increase the level of serotonin in your body, such as: ?Antidepressant medicines. ?Migraine medicines. ?Certain pain medicines. ?Certain drugs, including ecstasy, LSD, cocaine, and amphetamines. ?Over-the-counter cough or cold medicines that contain dextromethorphan. ?Certain herbal supplements, including St. John's wort, ginseng, and nutmeg. ?This condition usually occurs when you take these medicines or drugs in combination,  but it can also happen with a high dose of a single medicine or drug. ?What increases the risk? ?You are more likely to develop this condition if: ?You just started taking a medicine or drug that increases the level of serotonin in the body. ?You recently increased the dose of a medicine or drug that increases the level of serotonin in the body. ?You take more than one medicine or drug that increases the level of serotonin in the body. ?What are the signs or symptoms? ?Symptoms of this condition usually start within several hours of taking a medicine or drug. Symptoms may be mild or severe. Mild symptoms include: ?Sweating. ?Restlessness or agitation. ?Muscle twitching or stiffness. ?Rapid heart rate. ?Nausea and vomiting. ?Diarrhea. ?Headache. ?Shivering or goose bumps. ?Confusion. ?Severe symptoms include: ?Irregular heartbeat. ?Seizures. ?Loss of consciousness. ?High fever. ?How is this diagnosed? ?This condition may be diagnosed based on: ?Your medical history.  ?A physical exam. ?Your prior use of drugs and medicines. ?Blood or urine tests. These may be used to rule out other causes of your symptoms. ?How is this treated? ?The treatment for this condition depends on the severity of your symptoms. ?For mild cases, stopping the medicine or drug that caused your condition is usually all that is needed. ?For moderate to severe cases, treatment in a hospital may be needed to prevent or manage life-threatening symptoms. This may include medicines to control your symptoms, IV fluids, interventions to support your breathing, and treatments to control your body temperature. ?Follow these instructions at home: ?Medicines ? ?Take over-the-counter and prescription medicines only as told by your health care provider. This is important. ?Check with your health care provider before you start taking any new  prescriptions, over-the-counter medicines, herbs, or supplements. ?Avoid combining any medicines that can cause this  condition to occur. ?Lifestyle ? ?Maintain a healthy lifestyle. ?Eat a healthy diet that includes plenty of vegetables, fruits, whole grains, low-fat dairy products, and lean protein. Do not eat a lot of foods that are high in fat, added sugars, or salt. ?Get the right amount and quality of sleep. Most adults need 7-9 hours of sleep each night. ?Make time to exercise, even if it is only for short periods of time. Most adults should exercise for at least 150 minutes each week. ?Do not drink alcohol. ?Do not use illegal drugs, and do not take medicines for reasons other than they are prescribed. ?General instructions ?Do not use any products that contain nicotine or tobacco, such as cigarettes and e-cigarettes. If you need help quitting, ask your health care provider. ?Keep all follow-up visits as told by your health care provider. This is important. ?Contact a health care provider if: ?Your symptoms do not improve or they get worse. ?Get help right away if you: ?Have worsening confusion, severe headache, chest pain, high fever, seizures, or loss of consciousness. ?Experience serious side effects of medicine, such as swelling of your face, lips, tongue, or throat. ?Have serious thoughts about hurting yourself or others. ?These symptoms may represent a serious problem that is an emergency. Do not wait to see if the symptoms will go away. Get medical help right away. Call your local emergency services (911 in the [place] Do not drive yourself to the hospital. ?If you ever feel like you may hurt yourself or others, or have thoughts about taking your own life, get help right away. You can go to your nearest emergency department or call: ?Your local emergency services (911 in the [place] ??A suicide crisis helpline, such as the [facility] at 220-264-9958. This is open 24 hours a day. ?Summary ?Serotonin is a brain chemical that helps to regulate the nervous system. High levels of serotonin in the  body can cause serotonin syndrome, which is a very dangerous condition. ?This condition may be caused by taking medicines or drugs that increase the level of serotonin in your body. ?Treatment depends on the severity of your symptoms. For mild cases, stopping the medicine or drug that caused your condition is usually all that is needed. ?Check with your health care provider before you start taking any new prescriptions, over-the-counter medicines, herbs, or supplements. ?This information is not intended to replace advice given to you by your health care provider. Make sure you discuss any questions you have with your health care provider. ?Document Revised: 06/28/2017 Document Reviewed: 06/28/2017 ?Elsevier Patient Education ? [place]. ? ?

## 2021-08-21 NOTE — Progress Notes (Signed)
? ?Subjective:  ? ? Patient ID: Valerie Key, female    DOB: 26-Sep-1960, 61 y.o.   MRN: [id number] ? ?HPI ? ?Pt in for follow up. ? ?In September her a1c was 6.2.  ? ?Recent ct report reads. Age advanced coronary artery atherosclerosis. Recommend ?assessment of coronary risk factors and consideration of medical ?therapy. Pt went to heart doctor last year. ? ?" ?She is at increased cardiovascular risk for coronary artery calcification low-dose aspirin statin as appropriate continue rosuvastatin with goal LDL less than 70 and optimal antihypertensive therapy.  If symptomatic an ischemia evaluation be appropriate.  We decided to defer for the time being. ?Stable hypertension she will continue to monitor and work with her PCP tells me the predominance of her numbers are less than 828 systolic ?Stable COPD little or no symptomatology ?Continue her high intensity statin I asked my office to call her PCP because I am unable to access a lipid profile in the chart ?Cigarette smoking given instructions and and referral online to the American Heart Association or along with referral to the American Heart Association andg lung Association for coaching and education" ? ? ?Hx of smoking copd. She is on symbicort and spiriva. Albuterol as well. ? ? ? ?Htn- on hctz.  ? ?Depression- pt on celexa. ? ? ? ?Review of Systems  ?Constitutional:  Negative for chills, diaphoresis and fatigue.  ?HENT:  Negative for dental problem, drooling and ear discharge.   ?Respiratory:  Negative for cough, chest tightness, shortness of breath and wheezing.   ?Cardiovascular:  Negative for chest pain and palpitations.  ?Gastrointestinal:  Negative for abdominal pain.  ?Genitourinary:  Negative for dyspareunia, enuresis, flank pain and frequency.  ?Musculoskeletal:  Negative for back pain and joint swelling.  ?Skin:  Negative for pallor and rash.  ?Neurological:  Negative for dizziness, speech difficulty, numbness and headaches.   ?Hematological:  Negative for adenopathy. Does not bruise/bleed easily.  ?Psychiatric/Behavioral:  Negative for behavioral problems and confusion.   ? ?Past Medical History:  ?Diagnosis Date  ? Allergy   ? SEASONAL  ? Anxiety   ? Arthritis   ? Asthma   ? Back pain   ? Chronic pain syndrome   ? COPD (chronic obstructive pulmonary disease) ([place])   ? Depression   ? Diverticulosis   ? Fatty liver   ? Gastritis   ? GERD (gastroesophageal reflux disease)   ? Hiatal hernia   ? History of kidney stones   ? Hyperlipidemia   ? ?  ?Social History  ? ?Socioeconomic History  ? Marital status: Divorced  ?  Spouse name: Not on file  ? Number of children: 2  ? Years of education: Not on file  ? Highest education level: Not on file  ?Occupational History  ? Occupation: clerk  ?  Employer: [employer]  ?Tobacco Use  ? Smoking status: Every Day  ?  Packs/day: 0.50  ?  Years: 39.00  ?  Pack years: 19.50  ?  Types: Cigarettes  ? Smokeless tobacco: Never  ? Tobacco comments:  ?  6 ciggs daily-AH-08/17/2020  ?Vaping Use  ? Vaping Use: Never used  ?Substance and Sexual Activity  ? Alcohol use: Yes  ?  Comment: 1-2 daily  ? Drug use: No  ? Sexual activity: Yes  ?  Birth control/protection: Surgical  ?Other Topics Concern  ? Not on file  ?Social History Narrative  ? Not on file  ? ?Social Determinants of Health  ? ?  Financial Resource Strain: Not on file  ?Food Insecurity: Not on file  ?Transportation Needs: Not on file  ?Physical Activity: Not on file  ?Stress: Not on file  ?Social Connections: Not on file  ?Intimate Partner Violence: Not on file  ? ? ?Past Surgical History:  ?Procedure Laterality Date  ? ABDOMINAL HYSTERECTOMY    ? with bladder tact  ? BLADDER TACK    ? CHOLECYSTECTOMY N/A 07/26/2017  ? Procedure: LAPAROSCOPIC CHOLECYSTECTOMY;  Surgeon: Clovis Riley, MD;  Location: [facility];  Service: General;  Laterality: N/A;  ? ? ?Family History  ?Problem Relation Age of Onset  ? Diabetes Father   ? Colon cancer Paternal  Uncle   ?     x 2  ? Heart disease Maternal Grandfather   ? Heart disease Paternal Grandfather   ? ? ?Allergies  ?Allergen Reactions  ? Sulfa Antibiotics Hives and Rash  ? ? ?Current Outpatient Medications on File Prior to Visit  ?Medication Sig Dispense Refill  ? albuterol (VENTOLIN HFA) 108 (90 Base) MCG/ACT inhaler INHALE 1 PUFF INTO THE LUNGS EVERY 4 HOURS AS NEEDED FOR WHEEZING OR SHORTNESS OF BREATH. 8.5 each 0  ? ALPRAZolam (XANAX) 0.5 MG tablet Take 0.25 mg by mouth daily as needed for anxiety.     ? aspirin EC 81 MG tablet Take 1 tablet (81 mg total) by mouth daily. Swallow whole. 90 tablet 3  ? budesonide-formoterol (SYMBICORT) 160-4.5 MCG/ACT inhaler INHALE 2 PUFFS BY MOUTH FIRST THING IN AM AND THEN ANOTHER 2 PUFFS ABOUT 12 HOURS LATER. 10.2 each 3  ? Cholecalciferol (VITAMIN D3) 125 MCG (5000 UT) CAPS Take 1 capsule by mouth daily.    ? citalopram (CELEXA) 20 MG tablet TAKE 1 TABLET BY MOUTH EVERY DAY 30 tablet 3  ? Coenzyme Q10 10 MG capsule Take 10 mg by mouth daily.    ? diclofenac (VOLTAREN) 75 MG EC tablet Take 1 tablet (75 mg total) by mouth 2 (two) times daily. 20 tablet 0  ? fenofibrate (TRICOR) 48 MG tablet TAKE 1 TABLET BY MOUTH EVERY DAY 30 tablet 2  ? hydrochlorothiazide (HYDRODIURIL) 12.5 MG tablet TAKE 1 TABLET BY MOUTH EVERY DAY 30 tablet 2  ? HYDROcodone-acetaminophen (NORCO) 10-325 MG tablet Take 1 tablet by mouth every 4 (four) hours as needed.    ? levocetirizine (XYZAL) 5 MG tablet TAKE 1 TABLET BY MOUTH EVERYDAY AT BEDTIME 90 tablet 3  ? nicotine (NICODERM CQ - DOSED IN MG/24 HOURS) 21 mg/24hr patch Place 1 patch (21 mg total) onto the skin daily. 28 patch 3  ? pantoprazole (PROTONIX) 40 MG tablet Take 1 tablet (40 mg total) by mouth daily. **PLEASE CONTACT OFFICE TO SCHEDULE APPOINTMENT 30 tablet 0  ? rosuvastatin (CRESTOR) 10 MG tablet Take 1 tablet (10 mg total) by mouth every evening. 90 tablet 2  ? SPIRIVA RESPIMAT 2.5 MCG/ACT AERS INHALE 2 PUFFS BY MOUTH INTO THE LUNGS DAILY 4  g 5  ? vitamin E (VITAMIN E) 400 UNIT capsule Take 2 capsules (800 Units total) by mouth daily. 60 capsule 2  ? ?No current facility-administered medications on file prior to visit.  ? ? ?BP (!) 148/78   Pulse 100   Resp 18   Ht '5\' 4"'$  (1.626 m)   Wt 151 lb (68.5 kg)   SpO2 94%   BMI 25.92 kg/m?  ?  ? ?   ?Objective:  ? Physical Exam ? ?General ?Mental Status- Alert. General Appearance- Not in acute distress.  ? ?  Skin ?General: Color- Normal Color. Moisture- Normal Moisture. ? ?Neck ?Carotid Arteries- Normal color. Moisture- Normal Moisture. No carotid bruits. No JVD. ? ?Chest and Lung Exam ?Auscultation: ?Breath Sounds:-Normal. ? ?Cardiovascular ?Auscultation:Rythm- Regular. ?Murmurs & Other Heart Sounds:Auscultation of the heart reveals- No Murmurs. ? ?Abdomen ?Inspection:-Inspeection Normal. ?Palpation/Percussion:Note:No mass. Palpation and Percussion of the abdomen reveal- Non Tender, Non Distended + BS, no rebound or guarding. ? ? ?Neurologic ?Cranial Nerve exam:- CN III-XII intact(No nystagmus), symmetric smile. ?Strength:- 5/5 equal and symmetric strength both upper and lower extremities.  ? ? ?   ?Assessment & Plan:  ? ?COPD stable presently but still smoking pack a cigarette a day.  Continue Symbicort, Spiriva and albuterol.  For smoking cessation Wellbutrin 75 mg 1 tab daily.  We will see how you do with this and might increase dose in 1 month.  Rx advisement given regarding Wellbutrin.  Attaching serotonin syndrome information to AVS.  If you have such symptoms stop both Wellbutrin and Celexa. ? ?Depression stable.  Well-controlled with Celexa. ? ?Hyperlipidemia with coronary artery atherosclerosis on CT.  Nonfasting today/just ate some sausage not getting lipid panel.  But on review 5 months ago LDL was less than 70.  If you develop any degree of chest pain notify us and we will refer back to your cardiologist for ischemia work-up.  If any degree of significant chest pain as discussed then be seen  in emergency department. ? ?Elevated [id number] and metabolic panel today. ? ?Hypertension-blood pressure well controlled on recheck.  Continue current BP medication. ? ?Follow-up in 1 month or sooner if needed. ? ? ?E

## 2021-08-22 ENCOUNTER — Other Ambulatory Visit: Payer: Self-pay | Admitting: Physician Assistant

## 2021-09-08 IMAGING — MG MM DIGITAL SCREENING BILAT W/ TOMO AND CAD
8 series · 9 of 24 positions shown · non-contrast
Comparison: None.

CLINICAL DATA: Screening.

EXAM:
DIGITAL SCREENING BILATERAL MAMMOGRAM WITH TOMOSYNTHESIS AND CAD
TECHNIQUE: Bilateral screening digital craniocaudal and mediolateral oblique
mammograms were obtained. Bilateral screening digital breast
tomosynthesis was performed. The images were evaluated with
computer-aided detection.

[L MLO synth-2D]
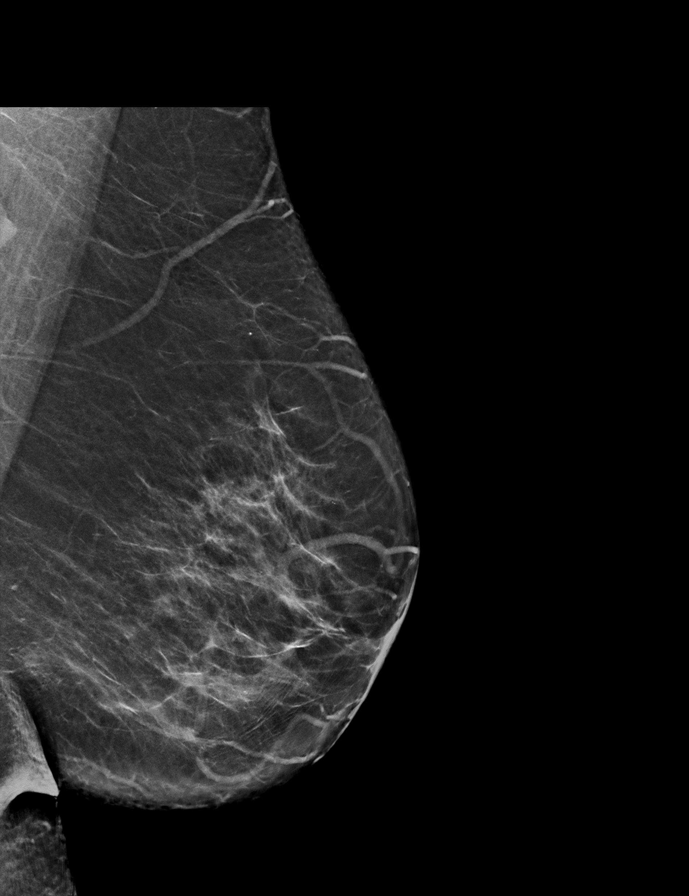

[R MLO synth-2D]
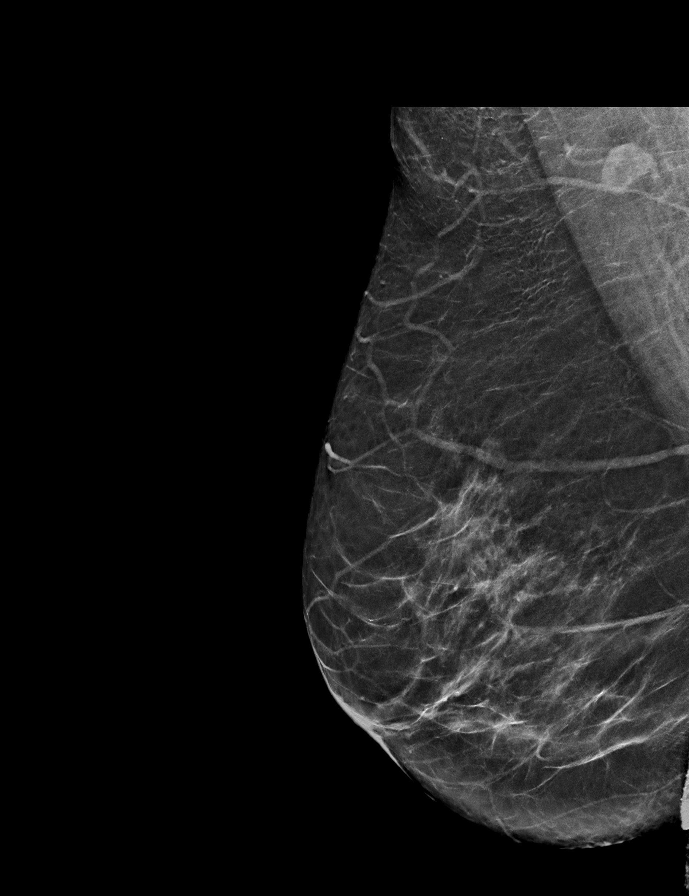

[R CC synth-2D]
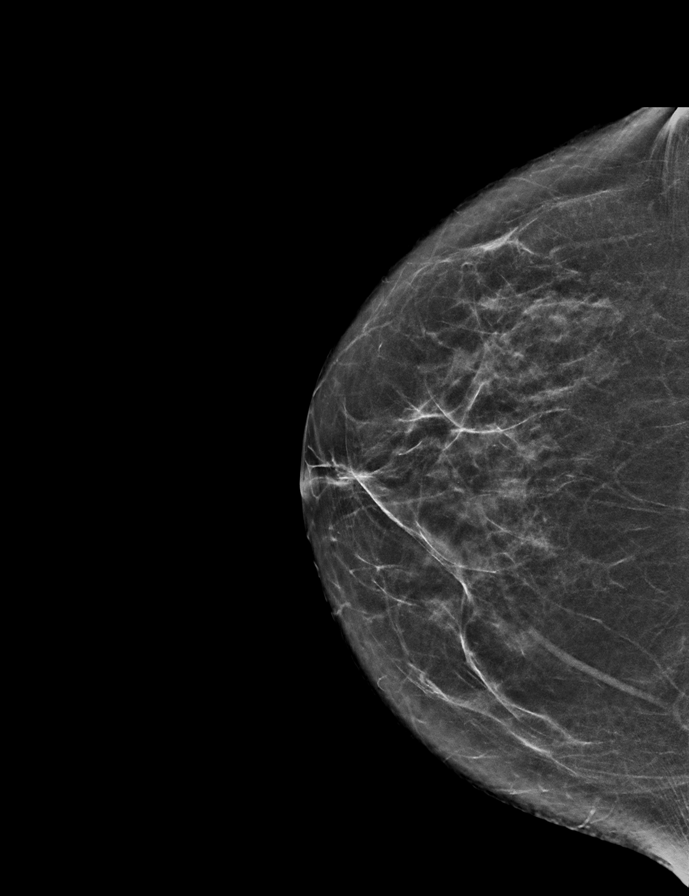

[L CC synth-2D]
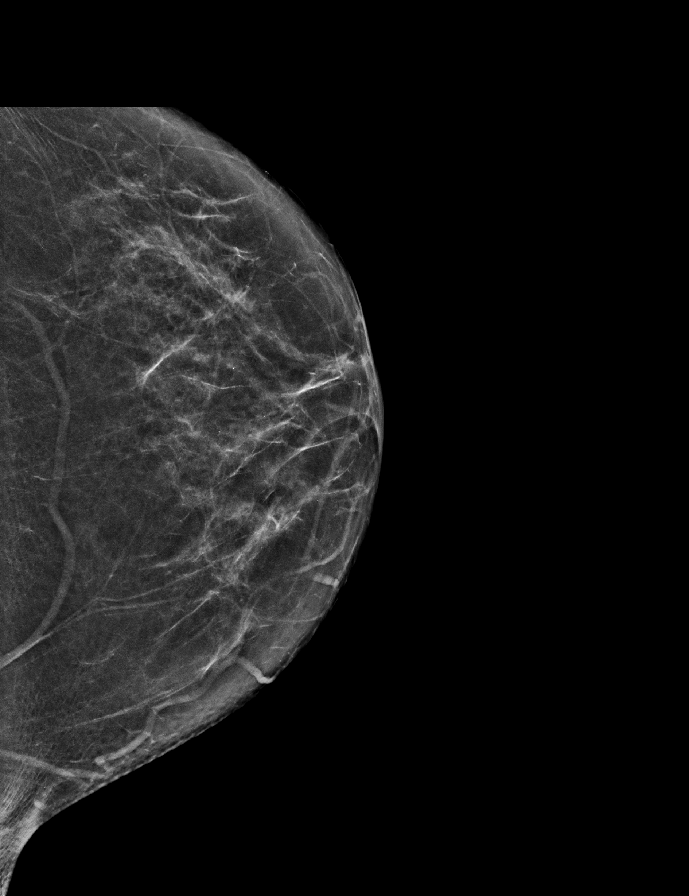

[L MLO tomo · 2 of 63 frames shown]
[frame 21/63]
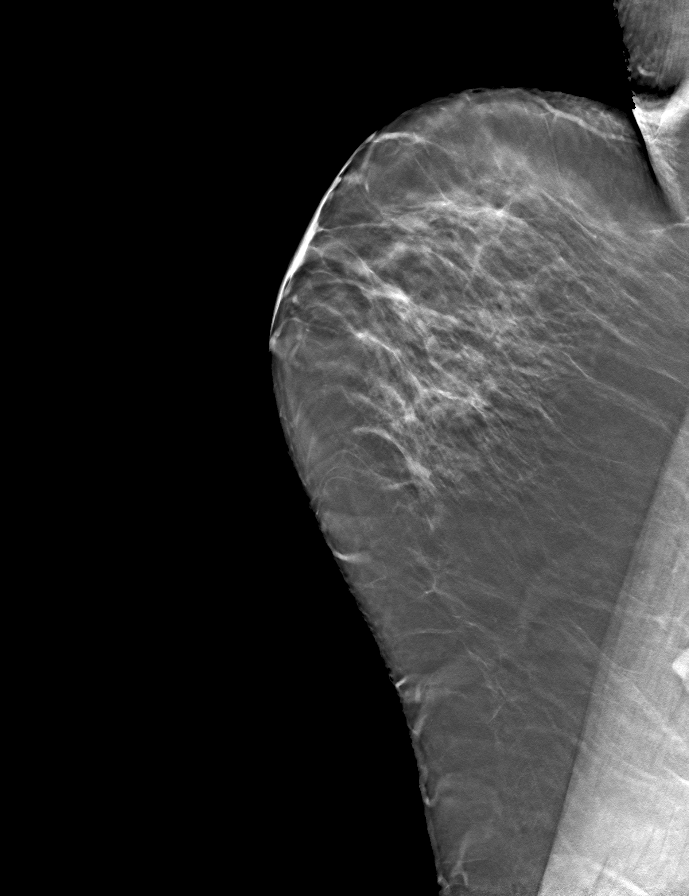
[frame 32/63]
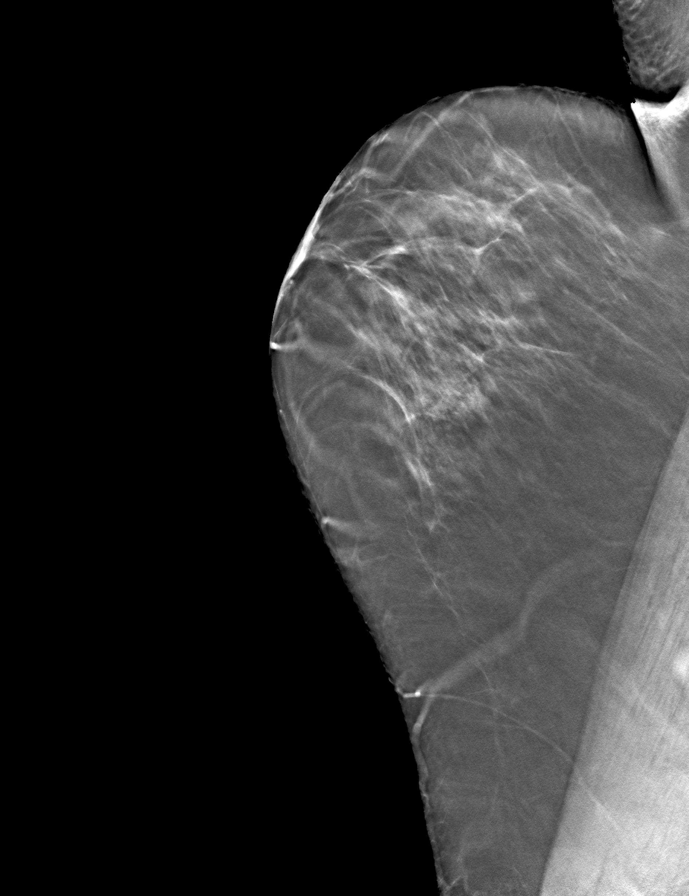

[R MLO tomo · tomo slice 33/65.0]
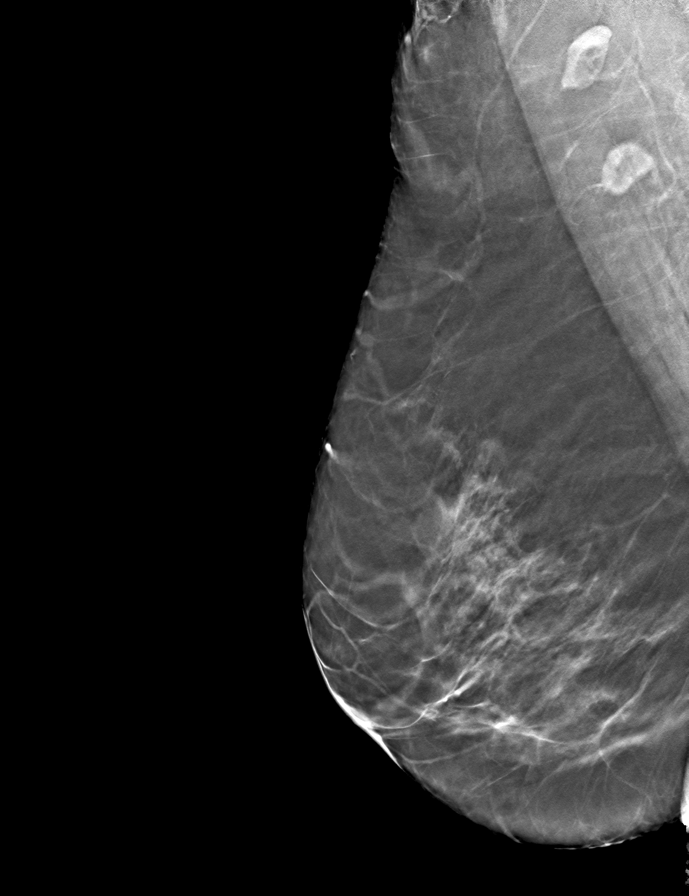

[R CC tomo · tomo slice 29/56.0]
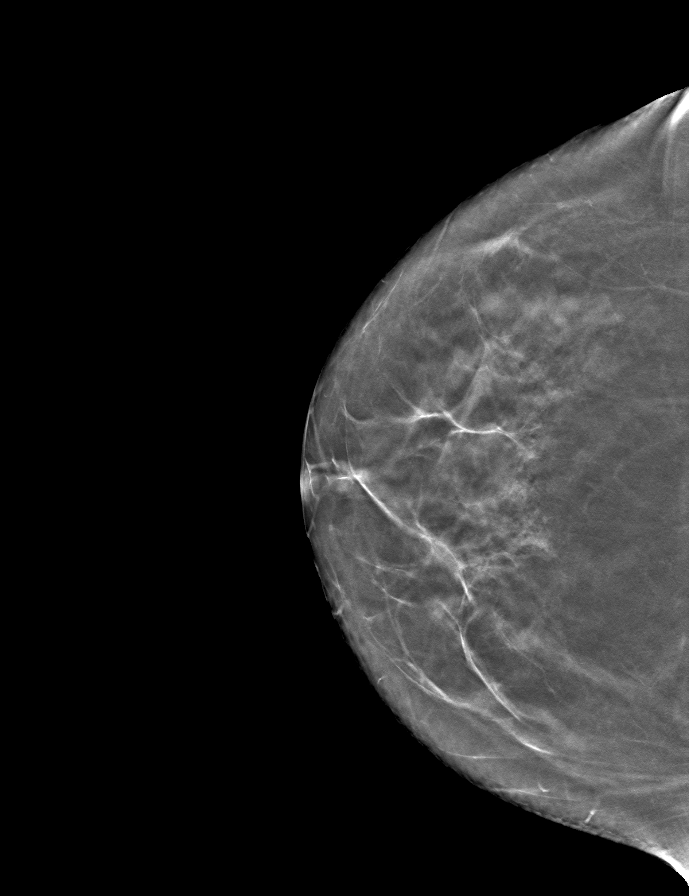

[L CC tomo · tomo slice 28/55.0]
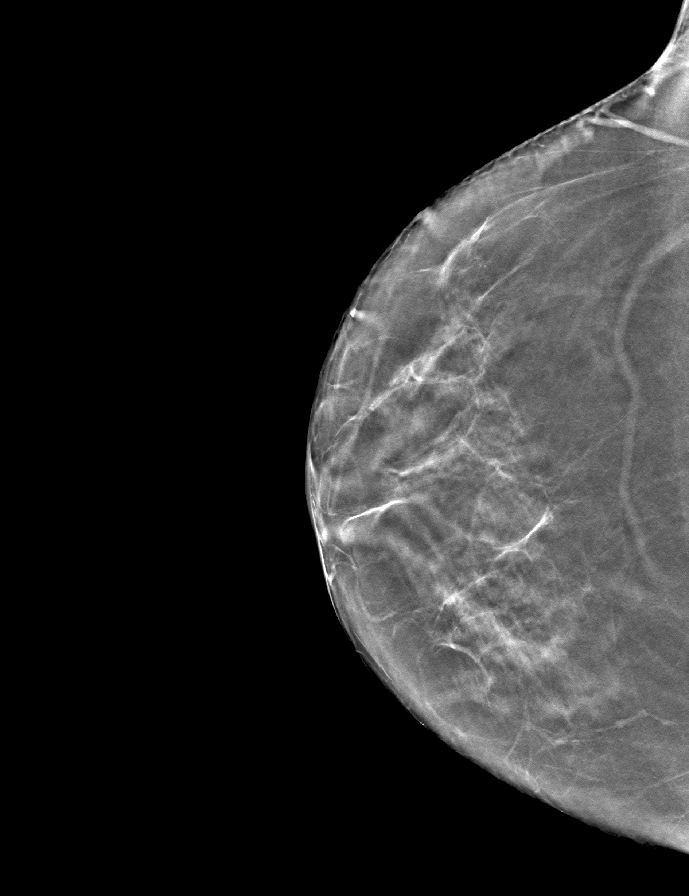

[9 of 24 positions shown; findings below may reference images not displayed]

ACR Breast Density Category b: There are scattered areas of
fibroglandular density.
FINDINGS: There are no findings suspicious for malignancy. The images were
evaluated with computer-aided detection.
IMPRESSION: No mammographic evidence of malignancy. A result letter of this
screening mammogram will be mailed directly to the patient.

RECOMMENDATION:
Screening mammogram in one year. (Code:C7-6-ASJ)

BI-RADS CATEGORY  1: Negative.

## 2021-09-14 ENCOUNTER — Telehealth: Payer: Self-pay | Admitting: Physician Assistant

## 2021-09-14 MED ORDER — PANTOPRAZOLE SODIUM 40 MG PO TBEC: 40.0000 mg | DELAYED_RELEASE_TABLET | Freq: Every day | ORAL | 0 refills | Status: DC

## 2021-09-14 NOTE — Telephone Encounter (Signed)
30 days has been sent to patient's pharmacy. No further refills will be given until she is seen. ?

## 2021-09-14 NOTE — Telephone Encounter (Signed)
Patient called to schedule OV in order to get a medication refill. Per patient, she is almost out of protonix. Patient would like to know if we can send some to last until her scheduled appointment on 10/18/21. Please advise.  ?

## 2021-09-21 ENCOUNTER — Ambulatory Visit: Payer: Commercial Managed Care - PPO | Admitting: Medical

## 2021-09-21 ENCOUNTER — Other Ambulatory Visit: Payer: Self-pay | Admitting: Medical

## 2021-10-10 ENCOUNTER — Other Ambulatory Visit: Payer: Self-pay | Admitting: Physician Assistant

## 2021-10-18 ENCOUNTER — Telehealth: Payer: Self-pay | Admitting: Physician Assistant

## 2021-10-18 ENCOUNTER — Ambulatory Visit: Payer: Commercial Managed Care - PPO | Admitting: Physician Assistant

## 2021-10-18 MED ORDER — PANTOPRAZOLE SODIUM 40 MG PO TBEC: 40.0000 mg | DELAYED_RELEASE_TABLET | Freq: Every day | ORAL | 0 refills | Status: DC

## 2021-10-18 NOTE — Telephone Encounter (Signed)
Pt came into the office for her appt with Amy. But her appt had to be rescheduled due to a change in Amy's scheduled. Pt's appt was r/s to 11/06/21. Pt is requesting a refill of Pantoprazole as she is completely our. Her pharmacy is [facility] on [address]. ?

## 2021-10-18 NOTE — Telephone Encounter (Signed)
30 days sent in. ?

## 2021-10-22 ENCOUNTER — Other Ambulatory Visit: Payer: Self-pay | Admitting: Medical

## 2021-11-03 ENCOUNTER — Telehealth: Payer: Self-pay | Admitting: Internal Medicine

## 2021-11-03 MED ORDER — SPIRIVA RESPIMAT 2.5 MCG/ACT IN AERS: 2.0000 | INHALATION_SPRAY | Freq: Every day | RESPIRATORY_TRACT | 0 refills | Status: DC

## 2021-11-03 NOTE — Telephone Encounter (Signed)
I called the patient and let her know that we could send enough to get her to the appointment. She voices understanding. Nothing further needed.

## 2021-11-03 NOTE — Telephone Encounter (Signed)
Patient made an appointment for 6/9 with Dr. Melvyn Novas, patient only has 2 days left on her Spiriva and wants to know if it can be called in before her appointment. Uses [facility] on [address].

## 2021-11-06 ENCOUNTER — Ambulatory Visit: Payer: Commercial Managed Care - PPO | Admitting: Physician Assistant

## 2021-11-06 ENCOUNTER — Encounter: Payer: Self-pay | Admitting: Physician Assistant

## 2021-11-06 VITALS — BP 124/62 | HR 91 | Ht 64.0 in | Wt 153.0 lb

## 2021-11-06 DIAGNOSIS — K760 Fatty (change of) liver, not elsewhere classified: Secondary | ICD-10-CM | POA: Diagnosis not present

## 2021-11-06 DIAGNOSIS — K219 Gastro-esophageal reflux disease without esophagitis: Secondary | ICD-10-CM | POA: Diagnosis not present

## 2021-11-06 MED ORDER — PANTOPRAZOLE SODIUM 40 MG PO TBEC: 40.0000 mg | DELAYED_RELEASE_TABLET | Freq: Every day | ORAL | 4 refills | Status: DC

## 2021-11-06 NOTE — Progress Notes (Signed)
Subjective:    Patient ID: Valerie Key, female    DOB: 07/17/1960, 61 y.o.   MRN: [id number]  HPI Valerie Key is a pleasant 61 year old female, established with Dr. Hilarie Fredrickson, with history of chronic GERD.  She comes back in today for medication refill and follow-up. She was last seen in April 2021 by myself.  She has been on Protonix 40 mg p.o. daily and says that this generally works well to control her symptoms.  She has no current complaints of dysphagia or odynophagia.  She does have occasional episodes of bloating postprandially but says that Tums usually works to relieve this. She did have EGD in 2019 which showed a normal esophagus and a 2 cm hiatal hernia. Last colonoscopy January 2019 with removal of 1 inflammatory rectal polyp and noted to have sigmoid diverticulosis, indicated for 10-year interval follow-up. She also has history of fatty liver and has been on vitamin E 800 international units daily Last abdominal ultrasound September 2021 status postcholecystectomy with echogenic appearing liver. Last labs March 2023 with normal LFTs. Patient says that she usually does drink at least 1 beer daily.  Other medical problems include hypertension, COPD with history of respiratory failure, asthma.  Review of Systems Pertinent positive and negative review of systems were noted in the above HPI section.  All other review of systems was otherwise negative.   Outpatient Encounter Medications as of 11/06/2021  Medication Sig   albuterol (VENTOLIN HFA) 108 (90 Base) MCG/ACT inhaler INHALE 1 PUFF INTO THE LUNGS EVERY 4 HOURS AS NEEDED FOR WHEEZING OR SHORTNESS OF BREATH.   ALPRAZolam (XANAX) 0.5 MG tablet Take 0.25 mg by mouth daily as needed for anxiety.    aspirin EC 81 MG tablet Take 1 tablet (81 mg total) by mouth daily. Swallow whole.   budesonide-formoterol (SYMBICORT) 160-4.5 MCG/ACT inhaler INHALE 2 PUFFS BY MOUTH FIRST THING IN AM AND THEN ANOTHER 2 PUFFS ABOUT 12 HOURS LATER.    buPROPion (WELLBUTRIN) 75 MG tablet 1 tab po a day   Cholecalciferol (VITAMIN D3) 125 MCG (5000 UT) CAPS Take 1 capsule by mouth daily.   citalopram (CELEXA) 20 MG tablet TAKE 1 TABLET BY MOUTH EVERY DAY   Coenzyme Q10 10 MG capsule Take 10 mg by mouth daily.   fenofibrate (TRICOR) 48 MG tablet TAKE 1 TABLET BY MOUTH EVERY DAY   hydrochlorothiazide (HYDRODIURIL) 12.5 MG tablet TAKE 1 TABLET BY MOUTH EVERY DAY   HYDROcodone-acetaminophen (NORCO) 10-325 MG tablet Take 1 tablet by mouth every 4 (four) hours as needed.   levocetirizine (XYZAL) 5 MG tablet TAKE 1 TABLET BY MOUTH EVERYDAY AT BEDTIME   nicotine (NICODERM CQ - DOSED IN MG/24 HOURS) 21 mg/24hr patch Place 1 patch (21 mg total) onto the skin daily.   rosuvastatin (CRESTOR) 10 MG tablet TAKE 1 TABLET BY MOUTH EVERY DAY IN THE EVENING   Tiotropium Bromide Monohydrate (SPIRIVA RESPIMAT) 2.5 MCG/ACT AERS Inhale 2 puffs into the lungs daily.   vitamin E (VITAMIN E) 400 UNIT capsule Take 2 capsules (800 Units total) by mouth daily.   [DISCONTINUED] pantoprazole (PROTONIX) 40 MG tablet Take 1 tablet (40 mg total) by mouth daily. **KEEP APPOINTMENT FOR FURTHER REFILLS   pantoprazole (PROTONIX) 40 MG tablet Take 1 tablet (40 mg total) by mouth daily.   [DISCONTINUED] diclofenac (VOLTAREN) 75 MG EC tablet Take 1 tablet (75 mg total) by mouth 2 (two) times daily.   No facility-administered encounter medications on file as of 11/06/2021.   Allergies  Allergen Reactions   Sulfa Antibiotics Hives and Rash   Patient Active Problem List   Diagnosis Date Noted   Patellofemoral pain syndrome of left knee 11/08/2020   History of kidney stones    Hiatal hernia    Gastritis    Fatty liver    Diverticulosis    COPD (chronic obstructive pulmonary disease) ([place])    Chronic pain syndrome    Back pain    Asthma    Arthritis    Allergy    Essential hypertension 07/17/2018   COPD GOLD 3 07/16/2018   Acute respiratory failure with hypoxia ([place])  06/28/2018   Anxiety 06/28/2018   COPD with acute exacerbation ([place]) 06/28/2018   Depression 06/28/2018   GERD (gastroesophageal reflux disease) 06/28/2018   Hyperlipidemia 06/28/2018   Cigarette smoker 06/28/2018   Dyspepsia 02/12/2012   Degenerative disc disease 01/02/2012   Chronic back pain 01/02/2012   Social History   Socioeconomic History   Marital status: Divorced    Spouse name: Not on file   Number of children: 2   Years of education: Not on file   Highest education level: Not on file  Occupational History   Occupation: Armed forces operational officer: [employer]  Tobacco Use   Smoking status: Every Day    Packs/day: 0.50    Years: 39.00    Pack years: 19.50    Types: Cigarettes   Smokeless tobacco: Never   Tobacco comments:    6 ciggs daily-AH-08/17/2020  Vaping Use   Vaping Use: Never used  Substance and Sexual Activity   Alcohol use: Yes    Comment: 1-2 daily   Drug use: No   Sexual activity: Yes    Birth control/protection: Surgical  Other Topics Concern   Not on file  Social History Narrative   Not on file   Social Determinants of Health   Financial Resource Strain: Not on file  Food Insecurity: Not on file  Transportation Needs: Not on file  Physical Activity: Not on file  Stress: Not on file  Social Connections: Not on file  Intimate Partner Violence: Not on file    Valerie Key's family history includes Colon cancer in her paternal uncle; Diabetes in her father; Heart disease in her maternal grandfather and paternal grandfather.      Objective:    Vitals:   11/06/21 0948  BP: 124/62  Pulse: 91    Physical Exam Well-developed well-nourished white female in no acute distress.  Height, Weight,153 BMI 26  HEENT; nontraumatic normocephalic, EOMI, PE R LA, sclera anicteric. Oropharynx; not examined today Neck; supple, no JVD Cardiovascular; regular rate and rhythm with S1-S2, no murmur rub or gallop Pulmonary; Clear bilaterally,  decreased breath sounds bilaterally Abdomen; soft, nontender, nondistended, no palpable mass or hepatosplenomegaly, bowel sounds are active Rectal; not done Skin; benign exam, no jaundice rash or appreciable lesions Extremities; no clubbing cyanosis or edema skin warm and dry Neuro/Psych; alert and oriented x4, grossly nonfocal mood and affect appropriate        Assessment & Plan:   #63 61 year old white female with history of chronic GERD, stable on Protonix 40 mg p.o. every morning #2 intermittent abdominal bloating #3 nonalcoholic fatty liver disease-normal LFTs March 2023/last ultrasound 2021 with hepatic steatosis Patient does drink beer at least 1/day  #4 Colon cancer surveillance-up-to-date with last colonoscopy January 2019, -1 inflammatory rectal polyp removed and noted sigmoid diverticulosis, follow-up in 10 years recommended. #5 status postcholecystectomy #6.  COPD #7.  Hypertension  Plan; continue Protonix 40 mg p.o. every morning, refill x1 year Continue vitamin E 800 IU daily Patient advised to discontinue EtOH use Schedule upper abdominal ultrasound Follow-up with Dr. Hilarie Fredrickson in 1 year or sooner as needed  Saverio Kader Genia Harold PA-C 11/06/2021   Cc: Mackie Pai, PA-C

## 2021-11-06 NOTE — Patient Instructions (Signed)
If you are age 59 or younger, your body mass index should be between 19-25. Your Body mass index is 26.26 kg/m. If this is out of the aformentioned range listed, please consider follow up with your Primary Care Provider.  ________________________________________________________  The [facility] providers would like to encourage you to use [facility] to communicate with providers for non-urgent requests or questions.  Due to long hold times on the telephone, sending your provider a message by [facility] may be a faster and more efficient way to get a response.  Please allow 48 business hours for a response.  Please remember that this is for non-urgent requests.  _______________________________________________________  Valerie Key have been scheduled for an abdominal ultrasound at [facility] (1st floor of hospital) on 11/15/2021 at 7:00 am. Please arrive 15 minutes prior to your appointment for registration. Make certain not to have anything to eat or drink 6 hours prior to your appointment. Should you need to reschedule your appointment, please contact radiology at [phone]. This test typically takes about 30 minutes to perform.  Continue Pantoprazole 40 mg 1 tablet every morning  Continue Vitamin E   Follow up in 1 year or sooner if needed.  Thank you for entrusting me with your care and choosing [facility].  Amy Esterwood, PA-C

## 2021-11-10 ENCOUNTER — Ambulatory Visit: Payer: Commercial Managed Care - PPO | Admitting: Internal Medicine

## 2021-11-10 ENCOUNTER — Encounter: Payer: Self-pay | Admitting: Internal Medicine

## 2021-11-10 DIAGNOSIS — J449 Chronic obstructive pulmonary disease, unspecified: Secondary | ICD-10-CM

## 2021-11-10 DIAGNOSIS — F17210 Nicotine dependence, cigarettes, uncomplicated: Secondary | ICD-10-CM

## 2021-11-10 MED ORDER — BREZTRI AEROSPHERE 160-9-4.8 MCG/ACT IN AERO: 2.0000 | INHALATION_SPRAY | Freq: Two times a day (BID) | RESPIRATORY_TRACT | 0 refills | Status: DC

## 2021-11-10 NOTE — Progress Notes (Unsigned)
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 with new pattern of recurrent uri/bronchitis/ wheezing x 2015 and fine in between on prn saba rarely needed but then admitted for first time with aecopd in setting of viral uri :  Admit date: 06/28/2018 Discharge date: 07/02/2018   Discharge Diagnoses:      Acute respiratory failure with hypoxia (HCC)  Chronic back pain  Anxiety  COPD with acute exacerbation (HCC)  Depression  GERD (gastroesophageal reflux disease)  Hyperlipidemia  Tobacco use   History of Present Illness  07/16/2018  Pulmonary/ 1st office eval/Valerie Key / active smoking  Chief Complaint  Patient presents with   Pulmonary Consult    Referred by Dr Tana Coast for eval of COPD. Pt states her breathing is "great". She stopped Breo approx 1 wk ago due to thrush. She has a rescue inhaler that she uses once every 2 wks approx.   Dyspnea:  More doe x MMRC2 = can't walk a nl pace on a flat grade s sob but does fine slow and flat Cough: minimum in am  Sleep: bed is flat with big pillow SABA use: back to baseline / rarely need  rec The key is to stop smoking completely before smoking completely stops you! - For smoking cessation classes call [phone]   Plan A = Automatic = Symbicort 80 Take 2 puffs first thing in am and then another 2 puffs about 12 hours later.  Work on inhaler technique:   Plan B = Backup Only use your albuterol inhaler(proair) as a rescue medication  Please schedule a follow up office visit in 4 weeks, sooner if needed with PFTs on return     08/15/2018 Referral: Lung cancer screening clinic Medications: Add Spiriva 1.25- take 2 puffs once daily (sample given, if helps let us know will send in RX) Continue Symbicort - take 2 puffs twice daily Back up - Albuterol rescue inhaler as needed  every 4 hours for shortness of breath/wheezing  Follow-up: 3-4 months with Dr. Melvyn Novas or sooner if breathing worsening  Due for Pneumococcal 23 vaccine at next visits (d/t smoking hx and age)    11/17/2018  f/u ov/Valerie Key re: COPD  3 on symb 46 and spiriva 1.25 x 2  Chief Complaint  Patient presents with   Follow-up    Breathing is doing well and no new co's. She is using her albuterol inhaler once per month on average.   Dyspnea:  MMRC2 = can't walk a nl pace on a flat grade s sob but does fine slow and flat eg Cough: minimal am  Sleeping: bed flat/ 2 pillows  Due to reflux  SABA use: rarely 02: none  rec Increase spiriva  2.5 mcg  X 2 puffs each am  Work on inhaler technique:   Good luck with the stop smoking    06/08/2019  f/u ov/Valerie Key re:  GOLD 3  / still smoking on symb 160/spiriva 2.5  Chief Complaint  Patient presents with   Follow-up    COPD, no symptoms  Dyspnea:  Varies a lot week to week, typically stops after a mile on a bad day and finish the mile on good day Cough: denies but did cough occ  during exam/interview Sleeping: ok on side 2 pillows flat bed  SABA use: once  every few weeks 02: none  Overt hb despite ppi rec Plan A = Automatic = Always=    symbicort increase to  160 Take 2 puffs first thing in am and then another 2 puffs about 12 hours later and spiriva 2.5 x 2 puffs first thing in am Plan B = Backup (to supplement plan A, not to replace it) Only use your albuterol inhaler as a rescue medication The key is to stop smoking completely before smoking completely stops you!   Changed from lisinopril to just just HCTZ around 12/2019   03/16/2020  f/u ov/Valerie Key re: GOLD 2 copd/ still smoking Chief Complaint  Patient presents with   Follow-up    Denies any problems with breathing  Dyspnea:   Walks up 30 min s stopping  Cough: some in am's  Sleeping: on side with 2 pillows  SABA use: rarely  02: none   Rec Stop smoking /yearly f/u   11/10/2021  f/u ov/Valerie Key/  [facility] re: GOLD 3   maint on symbicort/spiriva  Chief Complaint  Patient presents with   Follow-up    Breathing is some better since the last visit. She is using her albuterol inhaler 1-2 x per wk on average.   Dyspnea:  walking 1-2 miles 2-3 x per wek Cough: slt rattle  Sleeping: bed is flat/ 2 pillows on side with cpap' SABA use: couple times a week 02: none  Covid status:  vax x 2    No obvious day to day or daytime variability or assoc excess/ purulent sputum or mucus plugs or hemoptysis or cp or chest tightness, subjective wheeze or overt sinus or hb symptoms.   Sleeping  without nocturnal  or early am exacerbation  of respiratory  c/o's or need for noct saba. Also denies any obvious fluctuation of symptoms with weather or environmental changes or other aggravating or alleviating factors except as outlined above   No unusual exposure hx or h/o childhood pna/ asthma or knowledge of premature birth.  Current Allergies, Complete Past Medical History, Past Surgical History, Family History, and Social History were reviewed in [employer] record.  ROS  The following are not active complaints unless bolded Hoarseness, sore throat, dysphagia, dental problems, itching, sneezing,  nasal congestion or discharge of excess mucus or purulent secretions, ear ache,   fever, chills, sweats, unintended wt loss or wt gain, classically pleuritic or exertional cp,  orthopnea pnd or arm/hand swelling  or leg swelling, presyncope, palpitations, abdominal pain, anorexia, nausea, vomiting, diarrhea  or change in bowel habits or change in bladder habits, change in stools or change in urine, dysuria, hematuria,  rash, arthralgias, visual complaints, headache, numbness, weakness or ataxia or problems with walking or coordination,  change in mood or  memory.        Current Meds  Medication Sig   albuterol (VENTOLIN HFA) 108 (90 Base) MCG/ACT inhaler INHALE 1 PUFF INTO THE LUNGS  EVERY 4 HOURS AS NEEDED FOR WHEEZING OR SHORTNESS OF BREATH.   ALPRAZolam (XANAX) 0.5 MG tablet Take 0.25 mg by mouth daily as needed for anxiety.    aspirin EC 81 MG tablet Take 1 tablet (81 mg total) by mouth daily. Swallow whole.   budesonide-formoterol (SYMBICORT) 160-4.5 MCG/ACT inhaler INHALE 2 PUFFS BY MOUTH FIRST THING IN AM AND THEN ANOTHER 2 PUFFS ABOUT 12 HOURS LATER.   buPROPion (WELLBUTRIN) 75 MG tablet 1 tab po a day   Cholecalciferol (VITAMIN D3) 125 MCG (5000 UT) CAPS Take 1  capsule by mouth daily.   citalopram (CELEXA) 20 MG tablet TAKE 1 TABLET BY MOUTH EVERY DAY   Coenzyme Q10 10 MG capsule Take 10 mg by mouth daily.   fenofibrate (TRICOR) 48 MG tablet TAKE 1 TABLET BY MOUTH EVERY DAY   hydrochlorothiazide (HYDRODIURIL) 12.5 MG tablet TAKE 1 TABLET BY MOUTH EVERY DAY   HYDROcodone-acetaminophen (NORCO) 10-325 MG tablet Take 1 tablet by mouth every 4 (four) hours as needed.   levocetirizine (XYZAL) 5 MG tablet TAKE 1 TABLET BY MOUTH EVERYDAY AT BEDTIME   nicotine (NICODERM CQ - DOSED IN MG/24 HOURS) 21 mg/24hr patch Place 1 patch (21 mg total) onto the skin daily.   pantoprazole (PROTONIX) 40 MG tablet Take 1 tablet (40 mg total) by mouth daily.   rosuvastatin (CRESTOR) 10 MG tablet TAKE 1 TABLET BY MOUTH EVERY DAY IN THE EVENING   Tiotropium Bromide Monohydrate (SPIRIVA RESPIMAT) 2.5 MCG/ACT AERS Inhale 2 puffs into the lungs daily.   vitamin E (VITAMIN E) 400 UNIT capsule Take 2 capsules (800 Units total) by mouth daily.                      Objective:       11/10/2021         156  03/16/2020      154 06/08/2019          155   11/17/18 154 lb 3.2 oz (69.9 kg)  08/15/18 153 lb (69.4 kg)  07/16/18 152 lb 9.6 oz (69.2 kg)       Vital signs reviewed  11/10/2021  - Note at rest 02 sats  95% on RA   General appearance:    amb wf nad     Edentulous   HEENT :  Oropharynx  clear  Nasal turbinates nl    NECK :  without JVD/Nodes/TM/ nl carotid upstrokes  bilaterally   LUNGS: no acc muscle use,  Mod barrel  contour chest wall with bilateral  Distant bs s audible wheeze and  without cough on insp or exp maneuvers and mod  Hyperresonant  to  percussion bilaterally     CV:  RRR  no s3 or murmur or increase in P2, and no edema   ABD:  soft and nontender with pos mid insp Hoover's  in the supine position. No bruits or organomegaly appreciated, bowel sounds nl  MS:   Ext warm without deformities or   obvious joint restrictions , calf tenderness, cyanosis or clubbing  SKIN: warm and dry without lesions    NEURO:  alert, approp, nl sensorium with  no motor or cerebellar deficits apparent.            I personally reviewed images and agree with radiology impression as follows:  Chest CT  08/11/21  No nodules emphysema                Assessment

## 2021-11-10 NOTE — Patient Instructions (Signed)
The key is to stop smoking completely before smoking completely stops you!  Keep up with your lung cancer screening   Try breztri Take 1- 2 puffs first thing in am and then another 2 puffs about 12 hours later.   Please schedule a follow up visit in 12  months but call sooner if needed

## 2021-11-10 NOTE — Progress Notes (Signed)
Addendum: Reviewed and agree with assessment and management plan. Lejla Moeser M, MD  

## 2021-11-13 ENCOUNTER — Encounter: Payer: Self-pay | Admitting: Internal Medicine

## 2021-11-13 NOTE — Assessment & Plan Note (Signed)
Active smoker - alpha one AT levels 06/14/17 = 156  (MM proven 06/08/19) - Spirometry 07/16/2018  FEV1 1.4 (53%)  Ratio 0.58 typical curvature   - 07/16/2018  After extensive coaching inhaler device,  effectiveness =    75% with hfa so try symbicort 160 2bid - PFTs 08/15/2018    FEV1 1.12  (41 % ) ratio 0.48  p no % improvement from saba p symb 80 x 2  prior to study with DLCO  78/78c % corrects to 105  % for alv volume  And copd curvature on f/v loop > spiriva 1.25 x 2 added  - 11/17/2018   > continue symb 80 and increase spiriva to 2.5 x 2 puffs daily  - 06/08/2019  After extensive coaching inhaler device,  effectiveness =    90% increase symb to 160 2bid  - Alpha one Phenotype 06/08/2019  = MM - 11/10/2021  After extensive coaching inhaler device,  effectiveness = 90%     > try breztri instead of sym/spiriva (full dose of latter caused dry mouth so may only tol breztri one bid)   Group D (now reclassified as E) in terms of symptom/risk and laba/lama/ICS  therefore appropriate rx at this point >>>  Either breztri or sym/spiriva as above with approp saba   Re SABA :  I spent extra time with pt today reviewing appropriate use of albuterol for prn use on exertion with the following points: 1) saba is for relief of sob that does not improve by walking a slower pace or resting but rather if the pt does not improve after trying this first. 2) If the pt is convinced, as many are, that saba helps recover from activity faster then it's easy to tell if this is the case by re-challenging : ie stop, take the inhaler, then p 5 minutes try the exact same activity (intensity of workload) that just caused the symptoms and see if they are substantially diminished or not after saba 3) if there is an activity that reproducibly causes the symptoms, try the saba 15 min before the activity on alternate days   If in fact the saba really does help, then fine to continue to use it prn but advised may need to look closer at the  maintenance regimen being used to achieve better control of airways disease with exertion.

## 2021-11-13 NOTE — Assessment & Plan Note (Addendum)
Enrolled in LDCT program - see CT 02/26/20   Lung-RADS 2  - CT 08/11/2021 reviewed/ benign x for emphysema   Reviewed scan with pt including importance of emphysema  Counseled re importance of smoking cessation but did not meet time criteria for separate billing         >>> F/u in pulmonary clinic yearly, timed p yearly LDSCT     Each maintenance medication was reviewed in detail including emphasizing most importantly the difference between maintenance and prns and under what circumstances the prns are to be triggered using an action plan format where appropriate.  Total time for H and P, chart review, counseling, reviewing hfa/smi device(s) and generating customized AVS unique to this office visit / same day charting = 23 min

## 2021-11-15 ENCOUNTER — Ambulatory Visit (HOSPITAL_COMMUNITY)
Admission: RE | Admit: 2021-11-15 | Discharge: 2021-11-15 | Disposition: A | Discharge status: Home / Self Care | Payer: Commercial Managed Care - PPO | Source: Ambulatory Visit | Attending: Physician Assistant | Admitting: Physician Assistant

## 2021-11-15 DIAGNOSIS — K760 Fatty (change of) liver, not elsewhere classified: Secondary | ICD-10-CM | POA: Diagnosis present

## 2021-11-30 ENCOUNTER — Telehealth: Payer: Self-pay | Admitting: Internal Medicine

## 2021-11-30 MED ORDER — BREZTRI AEROSPHERE 160-9-4.8 MCG/ACT IN AERO: 2.0000 | INHALATION_SPRAY | Freq: Two times a day (BID) | RESPIRATORY_TRACT | 4 refills | Status: AC

## 2021-11-30 NOTE — Telephone Encounter (Signed)
Called patient and verified what medication she needed refilled and what pharmacy she wanted [place] to send the medication into. Nothing further needed

## 2021-12-15 ENCOUNTER — Other Ambulatory Visit: Payer: Self-pay | Admitting: Physical Medicine and Rehabilitation

## 2021-12-15 DIAGNOSIS — M5416 Radiculopathy, lumbar region: Secondary | ICD-10-CM

## 2021-12-18 ENCOUNTER — Ambulatory Visit
Admission: RE | Admit: 2021-12-18 | Discharge: 2021-12-18 | Disposition: A | Discharge status: Home / Self Care | Payer: Commercial Managed Care - PPO | Source: Ambulatory Visit | Attending: Physical Medicine and Rehabilitation | Admitting: Physical Medicine and Rehabilitation

## 2021-12-18 DIAGNOSIS — M5416 Radiculopathy, lumbar region: Secondary | ICD-10-CM

## 2021-12-25 ENCOUNTER — Other Ambulatory Visit: Payer: Self-pay | Admitting: Medical

## 2021-12-29 ENCOUNTER — Encounter: Payer: Self-pay | Admitting: Medical

## 2021-12-29 ENCOUNTER — Ambulatory Visit: Payer: Commercial Managed Care - PPO | Admitting: Medical

## 2021-12-29 VITALS — BP 145/75 | HR 97 | Resp 18 | Ht 64.0 in | Wt 153.4 lb

## 2021-12-29 DIAGNOSIS — E559 Vitamin D deficiency, unspecified: Secondary | ICD-10-CM | POA: Diagnosis not present

## 2021-12-29 DIAGNOSIS — F32A Depression, unspecified: Secondary | ICD-10-CM | POA: Diagnosis not present

## 2021-12-29 DIAGNOSIS — E785 Hyperlipidemia, unspecified: Secondary | ICD-10-CM

## 2021-12-29 DIAGNOSIS — F17210 Nicotine dependence, cigarettes, uncomplicated: Secondary | ICD-10-CM

## 2021-12-29 DIAGNOSIS — R5383 Other fatigue: Secondary | ICD-10-CM | POA: Diagnosis not present

## 2021-12-29 DIAGNOSIS — I10 Essential (primary) hypertension: Secondary | ICD-10-CM

## 2021-12-29 DIAGNOSIS — F419 Anxiety disorder, unspecified: Secondary | ICD-10-CM

## 2021-12-29 DIAGNOSIS — R739 Hyperglycemia, unspecified: Secondary | ICD-10-CM

## 2021-12-29 LAB — COMPREHENSIVE METABOLIC PANEL
ALT: 15 U/L (ref 0–35)
AST: 17 U/L (ref 0–37)
Albumin: 4.3 g/dL (ref 3.5–5.2)
Alkaline Phosphatase: 64 U/L (ref 39–117)
BUN: 10 mg/dL (ref 6–23)
CO2: 35 mEq/L — ABNORMAL HIGH (ref 19–32)
Calcium: 9.6 mg/dL (ref 8.4–10.5)
Chloride: 100 mEq/L (ref 96–112)
Creatinine, Ser: 0.83 mg/dL (ref 0.40–1.20)
GFR: 77.34 mL/min (ref >60.00)
Glucose, Bld: 91 mg/dL (ref 70–99)
Potassium: 3.8 mEq/L (ref 3.5–5.1)
Sodium: 144 mEq/L (ref 135–145)
Total Bilirubin: 0.4 mg/dL (ref 0.2–1.2)
Total Protein: 7 g/dL (ref 6.0–8.3)

## 2021-12-29 LAB — VITAMIN D 25 HYDROXY (VIT D DEFICIENCY, FRACTURES): VITD: 50.83 ng/mL (ref 30.00–100.00)

## 2021-12-29 LAB — CBC WITH DIFFERENTIAL/PLATELET
Basophils Absolute: 0.1 10*3/uL (ref 0.0–0.1)
Basophils Relative: 0.5 % (ref 0.0–3.0)
Eosinophils Absolute: 0.2 10*3/uL (ref 0.0–0.7)
Eosinophils Relative: 2.3 % (ref 0.0–5.0)
HCT: 40.7 % (ref 36.0–46.0)
Hemoglobin: 13.3 g/dL (ref 12.0–15.0)
Lymphocytes Relative: 22.4 % (ref 12.0–46.0)
Lymphs Abs: 2.4 10*3/uL (ref 0.7–4.0)
MCHC: 32.7 g/dL (ref 30.0–36.0)
MCV: 94.2 fl (ref 78.0–100.0)
Monocytes Absolute: 0.8 10*3/uL (ref 0.1–1.0)
Monocytes Relative: 7.5 % (ref 3.0–12.0)
Neutro Abs: 7.2 10*3/uL (ref 1.4–7.7)
Neutrophils Relative %: 67.3 % (ref 43.0–77.0)
Platelets: 267 10*3/uL (ref 150.0–400.0)
RBC: 4.32 Mil/uL (ref 3.87–5.11)
RDW: 13.8 % (ref 11.5–15.5)
WBC: 10.8 10*3/uL — ABNORMAL HIGH (ref 4.0–10.5)

## 2021-12-29 LAB — HEMOGLOBIN A1C: Hgb A1c MFr Bld: 6.4 % (ref 4.6–6.5)

## 2021-12-29 LAB — TSH: TSH: 2.34 u[IU]/mL (ref 0.35–5.50)

## 2021-12-29 LAB — VITAMIN B12: Vitamin B-12: 285 pg/mL (ref 211–911)

## 2021-12-29 LAB — LIPID PANEL
Cholesterol: 161 mg/dL (ref 0–200)
HDL: 57.5 mg/dL (ref >39.00)
LDL Cholesterol: 68 mg/dL (ref 0–99)
NonHDL: 103.26
Total CHOL/HDL Ratio: 3
Triglycerides: 176 mg/dL — ABNORMAL HIGH (ref 0.0–149.0)
VLDL: 35.2 mg/dL (ref 0.0–40.0)

## 2021-12-29 LAB — IRON: Iron: 91 ug/dL (ref 42–145)

## 2021-12-29 LAB — T4, FREE: Free T4: 0.75 ng/dL (ref 0.60–1.60)

## 2021-12-29 NOTE — Progress Notes (Signed)
   Subjective:    Patient ID: Valerie Key, female    DOB: 28-Dec-1960, 61 y.o.   MRN: [id number]  HPI  Pt in stating she has felt exhaused/fatigued. Going on for one week.   No fever, no chills, no sweats, no body aches, no insomnia, no depression and no black stools.  Pt states recently more stressed/anxious. Her work may be about to let her go. 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 Gad-7 score 6. Pt is on celexa. She has not taken wellturin yet.  Smoker- had written wellbutrin with hopes would quit smoking.  Htn- at home bp 137/79. Pt only on hctz 12.5 mg daily.  Elevated sugar in past with a1c up to 6.3.  Hyperlipidemia- she is on crestor 10 mg daily and fenofibrate 48 mg daily.    Review of Systems  Constitutional:  Positive for fatigue.  HENT:  Negative for congestion.   Respiratory:  Negative for cough, choking, shortness of breath and wheezing.   Cardiovascular:  Negative for chest pain and palpitations.  Gastrointestinal:  Negative for abdominal pain.  Genitourinary:  Negative for dysuria, flank pain and hematuria.  Skin:  Negative for pallor and rash.  Neurological:  Negative for dizziness, speech difficulty and headaches.  Hematological:  Negative for adenopathy. Does not bruise/bleed easily.  Psychiatric/Behavioral:  Negative for behavioral problems, decreased concentration, dysphoric mood, sleep disturbance and suicidal ideas. The patient is nervous/anxious.            Objective:   Physical Exam  General Mental Status- Alert. General Appearance- Not in acute distress.   Skin General: Color- Normal Color. Moisture- Normal Moisture.  Neck Carotid Arteries- Normal color. Moisture- Normal Moisture. No carotid bruits. No JVD.  Chest and Lung Exam Auscultation: Breath Sounds:-Normal.  Cardiovascular Auscultation:Rythm- Regular. Murmurs & Other Heart Sounds:Auscultation of the heart reveals- No  Murmurs.  Abdomen Inspection:-Inspeection Normal. Palpation/Percussion:Note:No mass. Palpation and Percussion of the abdomen reveal- Non Tender, Non Distended + BS, no rebound or guarding. No suprapubic tenderness.   Neurologic Cranial Nerve exam:- CN III-XII intact(No nystagmus), symmetric smile. Strength:- 5/5 equal and symmetric strength both upper and lower extremities.   Back- no cva tenderness.      Assessment & Plan:   Patient Instructions  For fatigue will get cbc, cmp, tsh, t4, iron, b12 and b1 level.  Vit deficiency- vit d level today.  Smoker- recommend reconsidering using welllbutrin for smoking to stop smoking.  Htn- mild high today on recheck. Continue hctz. Recheck bp at home and update me today. If over 140/90 would add on med.  High cholesterol- get cmp and lipid panel today. Continue current statin and fenofibrate.  Elevated sugar get a1c.  Depression- mood stable. Continue celexa.  Stress and some anxiety about potential job situation. GAD-7 6 score. If you anxiety increases could offer buspar.  Vit d def- recheck level.   Follow up in 3 weeks or sooner if needed.     Mackie Pai, PA-C

## 2021-12-29 NOTE — Patient Instructions (Addendum)
For fatigue will get cbc, cmp, tsh, t4, iron, b12 and b1 level.  Vit deficiency- vit d level today.  Smoker- recommend reconsidering using welllbutrin for smoking to stop smoking.  Htn- mild high today on recheck. Continue hctz. Recheck bp at home and update me today. If over 140/90 would add on med.  High cholesterol- get cmp and lipid panel today. Continue current statin and fenofibrate.  Elevated sugar get a1c.  Depression- mood stable. Continue celexa.  Stress and some anxiety about potential job situation. GAD-7 6 score. If you anxiety increases could offer buspar.  Vit d def- recheck level.   Follow up in 3 weeks or sooner if needed.

## 2022-01-02 DEATH — deceased

## 2022-01-03 ENCOUNTER — Other Ambulatory Visit: Payer: Self-pay | Admitting: Pulmonary Disease

## 2022-01-03 LAB — VITAMIN B1: Vitamin B1 (Thiamine): 14 nmol/L (ref 8–30)

## 2022-01-04 ENCOUNTER — Ambulatory Visit (INDEPENDENT_AMBULATORY_CARE_PROVIDER_SITE_OTHER): Payer: Commercial Managed Care - PPO

## 2022-01-04 DIAGNOSIS — R5383 Other fatigue: Secondary | ICD-10-CM | POA: Diagnosis not present

## 2022-01-04 MED ORDER — CYANOCOBALAMIN 1000 MCG/ML IJ SOLN
1000.0000 ug | Freq: Once | INTRAMUSCULAR | Status: AC
  Administered 2022-01-04: 1000 ug via INTRAMUSCULAR

## 2022-01-04 NOTE — Progress Notes (Addendum)
Valerie Key is a 61 y.o. female presents to the office today for B12:  injections, per physician's orders. Original order: on lab results from 7/28 to do once a week for 4 weeks and one a month for 5 monts.  Cyanocobalamin (med), 1000 mg/ml (dose),  IM (route) was administered left deltoid (location) today. Patient tolerated injection. Patient due for follow up labs/provider appt: Yes. Date due: 01/12/22, appt made Yes   Jolene Provost Rennis Petty, PA-C

## 2022-01-12 ENCOUNTER — Ambulatory Visit (INDEPENDENT_AMBULATORY_CARE_PROVIDER_SITE_OTHER): Payer: Commercial Managed Care - PPO

## 2022-01-12 DIAGNOSIS — E538 Deficiency of other specified B group vitamins: Secondary | ICD-10-CM

## 2022-01-12 MED ORDER — CYANOCOBALAMIN 1000 MCG/ML IJ SOLN
1000.0000 ug | Freq: Once | INTRAMUSCULAR | Status: AC
  Administered 2022-01-12: 1000 ug via INTRAMUSCULAR

## 2022-01-12 NOTE — Progress Notes (Addendum)
Glennice Marcos is a 60 y.o. female presents to the office today 2/4 weekly B12 injections, per physician's orders. Original order: on lab results from 7/28 to do once a week for 4 weeks and one a month for 5 monts.  Cyanocobalamin (med), 1000 mg/ml (dose),  IM (route) was administered right deltoid (location) today. Patient tolerated injection. Patient due for follow up labs/provider appt: Yes. Date due: 01/18/22, appt made Yes   Mackie Pai, PA-C

## 2022-01-18 ENCOUNTER — Ambulatory Visit: Payer: Commercial Managed Care - PPO | Admitting: Medical

## 2022-01-18 ENCOUNTER — Ambulatory Visit (INDEPENDENT_AMBULATORY_CARE_PROVIDER_SITE_OTHER): Payer: Commercial Managed Care - PPO

## 2022-01-18 DIAGNOSIS — E538 Deficiency of other specified B group vitamins: Secondary | ICD-10-CM | POA: Diagnosis not present

## 2022-01-18 MED ORDER — CYANOCOBALAMIN 1000 MCG/ML IJ SOLN
1000.0000 ug | Freq: Once | INTRAMUSCULAR | Status: AC
  Administered 2022-01-18: 1000 ug via INTRAMUSCULAR

## 2022-01-18 NOTE — Progress Notes (Signed)
Valerie Key is a 61 y.o. female presents to the office today for B12: 3/4 weekly  injections, per physician's orders. Original order: on lab results from 7/28 to do once a week for 4 weeks and one a month for 5 monts.  Cyanocobalamin (med), 1000 mg/ml (dose),  im (route) was administered left deltoid (location) today. Patient tolerated injection.  Patient next injection due: in one week for B12 , appt made Yes for 01/25/22 for b12 #4 of 4 weekly.   Jiles Prows

## 2022-01-25 ENCOUNTER — Ambulatory Visit (INDEPENDENT_AMBULATORY_CARE_PROVIDER_SITE_OTHER): Payer: Commercial Managed Care - PPO

## 2022-01-25 DIAGNOSIS — E538 Deficiency of other specified B group vitamins: Secondary | ICD-10-CM | POA: Diagnosis not present

## 2022-01-25 MED ORDER — CYANOCOBALAMIN 1000 MCG/ML IJ SOLN
1000.0000 ug | Freq: Once | INTRAMUSCULAR | Status: AC
  Administered 2022-01-25: 1000 ug via INTRAMUSCULAR

## 2022-01-25 NOTE — Progress Notes (Signed)
Pt here for weekly 4 of 4 B12 injection per Percell Miller  B12 1045mg given R deltoid IM, and pt tolerated injection well.  Next B12 injection scheduled for 1 month.

## 2022-02-06 ENCOUNTER — Telehealth: Payer: Self-pay | Admitting: Internal Medicine

## 2022-02-06 MED ORDER — BREZTRI AEROSPHERE 160-9-4.8 MCG/ACT IN AERO: 2.0000 | INHALATION_SPRAY | Freq: Two times a day (BID) | RESPIRATORY_TRACT | 5 refills | Status: DC

## 2022-02-06 NOTE — Telephone Encounter (Signed)
Called and spoke with patient.  Patient requested a refill for Breztri inhaler to be sent to [facility].  [facility]tri [facility].  Nothing further at this time.      LOV 11/10/21 with Dr. Melvyn Novas  Instructions  The key is to stop smoking completely before smoking completely stops you!   Keep up with your lung cancer screening    Try breztri Take 1- 2 puffs first thing in am and then another 2 puffs about 12 hours later.    Please schedule a follow up visit in 12  months but call sooner if needed

## 2022-02-15 ENCOUNTER — Other Ambulatory Visit (HOSPITAL_COMMUNITY): Payer: Self-pay

## 2022-02-15 MED ORDER — HYDROCODONE-ACETAMINOPHEN 10-325 MG PO TABS
1.0000 | ORAL_TABLET | ORAL | 0 refills | Status: DC | PRN
  Filled 2022-02-15: qty 180, 30d supply, fill #0

## 2022-02-26 ENCOUNTER — Other Ambulatory Visit: Payer: Self-pay | Admitting: Medical

## 2022-03-08 ENCOUNTER — Ambulatory Visit (INDEPENDENT_AMBULATORY_CARE_PROVIDER_SITE_OTHER): Payer: Commercial Managed Care - PPO

## 2022-03-08 DIAGNOSIS — E538 Deficiency of other specified B group vitamins: Secondary | ICD-10-CM

## 2022-03-08 MED ORDER — CYANOCOBALAMIN 1000 MCG/ML IJ SOLN
1000.0000 ug | Freq: Once | INTRAMUSCULAR | Status: AC
  Administered 2022-03-08: 1000 ug via INTRAMUSCULAR

## 2022-03-08 NOTE — Progress Notes (Addendum)
Pt here for Monthly B-12 injection per Percell Miller   Original Order: Recommend getting set up for weekly b12 vitamin injection over next 4 weeks followed by monthly injection for 5 months. Then repeat b12 level in 6 months.   B12 1068mg given left deltoid IM, and pt tolerated injection well.   Next B12 injection scheduled for 1 month.   EMackie Pai PA-C

## 2022-03-15 ENCOUNTER — Other Ambulatory Visit (HOSPITAL_COMMUNITY): Payer: Self-pay

## 2022-03-15 MED ORDER — HYDROCODONE-ACETAMINOPHEN 10-325 MG PO TABS
1.0000 | ORAL_TABLET | ORAL | 0 refills | Status: DC | PRN
  Filled 2022-04-16: qty 180, 30d supply, fill #0

## 2022-03-15 MED ORDER — HYDROCODONE-ACETAMINOPHEN 10-325 MG PO TABS
1.0000 | ORAL_TABLET | ORAL | 0 refills | Status: DC
  Filled 2022-03-15: qty 180, 30d supply, fill #0

## 2022-03-28 ENCOUNTER — Other Ambulatory Visit: Payer: Self-pay | Admitting: Medical

## 2022-04-10 ENCOUNTER — Ambulatory Visit (INDEPENDENT_AMBULATORY_CARE_PROVIDER_SITE_OTHER): Payer: Commercial Managed Care - PPO

## 2022-04-10 DIAGNOSIS — Z23 Encounter for immunization: Secondary | ICD-10-CM | POA: Diagnosis not present

## 2022-04-10 DIAGNOSIS — E538 Deficiency of other specified B group vitamins: Secondary | ICD-10-CM | POA: Diagnosis not present

## 2022-04-10 MED ORDER — CYANOCOBALAMIN 1000 MCG/ML IJ SOLN
1000.0000 ug | Freq: Once | INTRAMUSCULAR | Status: AC
  Administered 2022-04-10: 1000 ug via INTRAMUSCULAR

## 2022-04-10 NOTE — Progress Notes (Addendum)
Valerie Key is a 61 y.o. female presents to the office today for 2/5 monthly B12  injection, per physician's orders. Original order:  7/28 to do once a week for 4 weeks and one a month for 5 months.  Cyanocobalamin (med), 1000 mg/ml (dose),  im (route) was administered R deltoid (location) today Patient tolerated injection. Patient due for follow up labs/provider appt: No.  Patient next injection due: 1 Month, appt made Yes  Pt also received Influenza Vaccine in L Deltoid.  Creft, Margie Ege, PA-C

## 2022-04-12 ENCOUNTER — Other Ambulatory Visit (HOSPITAL_COMMUNITY): Payer: Self-pay

## 2022-04-16 ENCOUNTER — Other Ambulatory Visit (HOSPITAL_COMMUNITY): Payer: Self-pay

## 2022-05-04 ENCOUNTER — Encounter: Payer: Self-pay | Admitting: Medical

## 2022-05-10 ENCOUNTER — Other Ambulatory Visit (HOSPITAL_COMMUNITY): Payer: Self-pay

## 2022-05-10 ENCOUNTER — Ambulatory Visit (INDEPENDENT_AMBULATORY_CARE_PROVIDER_SITE_OTHER): Payer: Commercial Managed Care - PPO | Admitting: *Deleted

## 2022-05-10 DIAGNOSIS — E538 Deficiency of other specified B group vitamins: Secondary | ICD-10-CM

## 2022-05-10 MED ORDER — HYDROCODONE-ACETAMINOPHEN 10-325 MG PO TABS
1.0000 | ORAL_TABLET | ORAL | 0 refills | Status: DC | PRN
  Filled 2022-06-15: qty 180, 30d supply, fill #0

## 2022-05-10 MED ORDER — CYANOCOBALAMIN 1000 MCG/ML IJ SOLN
1000.0000 ug | Freq: Once | INTRAMUSCULAR | Status: AC
  Administered 2022-05-10: 1000 ug via INTRAMUSCULAR

## 2022-05-10 MED ORDER — HYDROCODONE-ACETAMINOPHEN 10-325 MG PO TABS
1.0000 | ORAL_TABLET | ORAL | 0 refills | Status: DC | PRN
  Filled 2022-05-16: qty 180, 30d supply, fill #0

## 2022-05-10 NOTE — Progress Notes (Addendum)
Chelse Matas is a 61 y.o. female presents to the office today for 2/5 monthly B12  injection, per physician's orders. Original order:  7/28 to do once a week for 4 weeks and one a month for 5 months.  Cyanocobalamin (med), 1000 mg/ml (dose),  im (route) was administered L deltoid (location) today Patient tolerated injection. Patient due for follow up labs/provider appt: No.  Patient next injection due: 1 Month, appt made Yes   Mackie Pai, PA-C

## 2022-05-16 ENCOUNTER — Other Ambulatory Visit (HOSPITAL_COMMUNITY): Payer: Self-pay

## 2022-06-08 ENCOUNTER — Ambulatory Visit (INDEPENDENT_AMBULATORY_CARE_PROVIDER_SITE_OTHER): Payer: 59

## 2022-06-08 DIAGNOSIS — E538 Deficiency of other specified B group vitamins: Secondary | ICD-10-CM

## 2022-06-08 MED ORDER — CYANOCOBALAMIN 1000 MCG/ML IJ SOLN
1000.0000 ug | Freq: Once | INTRAMUSCULAR | Status: AC
  Administered 2022-06-08: 1000 ug via INTRAMUSCULAR

## 2022-06-08 NOTE — Progress Notes (Addendum)
Valerie Key is a 62 y.o. female presents to the office today for 4/5 monthly B12  injection, per physician's orders. Original order:  "7/28 to do once a week for 4 weeks and one a month for 5 months." Cyanocobalamin (med), 1000 mg/ml (dose),  im (route) was administered L deltoid (location) today Patient tolerated injection. Patient due for follow up labs/provider appt: No.  Patient next injection due: 1 Month, appt made Yes  Creft, Margie Ege, PA-C

## 2022-06-13 ENCOUNTER — Other Ambulatory Visit: Payer: Self-pay | Admitting: Internal Medicine

## 2022-06-14 NOTE — Telephone Encounter (Signed)
Pharmacy team, are you able to do a benefits investigation to check for alternative for Breztri? Thanks!

## 2022-06-15 ENCOUNTER — Other Ambulatory Visit (HOSPITAL_COMMUNITY): Payer: Self-pay

## 2022-06-15 NOTE — Telephone Encounter (Signed)
Per benefits investigation alternatives covered at this time are Wixela, and Spiriva Respimat/Spiriva Handihaler to complete triple therapy.

## 2022-06-20 ENCOUNTER — Telehealth: Payer: Self-pay | Admitting: Internal Medicine

## 2022-06-20 NOTE — Telephone Encounter (Signed)
Will need ov for any of these alternatives as the devices are so different.  See if can give sample and then return to clinic with this year's formulary to regroup

## 2022-06-20 NOTE — Telephone Encounter (Signed)
Can you tell me what is preferred by her insurance?

## 2022-06-21 ENCOUNTER — Other Ambulatory Visit (HOSPITAL_COMMUNITY): Payer: Self-pay

## 2022-06-21 NOTE — Telephone Encounter (Signed)
Symbicort 160 2bid and spiriva 2.5  x 2 puff q am but will need to have pharmacist show her how to use the respimat device or see NP or me for training

## 2022-06-21 NOTE — Telephone Encounter (Signed)
Valerie Key is not covered by insurance. Prior our prior auth team Wixela, Symbicort are covered for ICS/LABA. Spiriva Respimat and Spiriva Handihaler are covered for LAMA in order to complete triple therapy.   Please advice on which inhaler to send

## 2022-06-21 NOTE — Telephone Encounter (Signed)
Per benefits investigation Wixela (generic Advair Diksus) and Symbicort are covered for ICS/LABA. Spiriva Respimat and Spiriva Handihaler are covered for LAMA in order to complete triple therapy.

## 2022-06-22 MED ORDER — SPIRIVA RESPIMAT 2.5 MCG/ACT IN AERS: 2.0000 | INHALATION_SPRAY | Freq: Every day | RESPIRATORY_TRACT | 9 refills | Status: DC

## 2022-06-22 MED ORDER — BUDESONIDE-FORMOTEROL FUMARATE 160-4.5 MCG/ACT IN AERO: INHALATION_SPRAY | RESPIRATORY_TRACT | 9 refills | Status: DC

## 2022-06-22 NOTE — Telephone Encounter (Signed)
Tried calling the pt  Someone answered and did not speak and then hung up  Will try back later.

## 2022-06-22 NOTE — Telephone Encounter (Signed)
ATC LMTCB 06/22/22 @ 8:50 AM

## 2022-06-22 NOTE — Telephone Encounter (Signed)
Spoke with pt and reviewed Dr. Gustavus Bryant advise and inhaler education. Pt states she has used Spiriva before and knows how to use it. Inhalers called into verified pharmacy. Nothing further needed at this time.

## 2022-06-23 ENCOUNTER — Other Ambulatory Visit: Payer: Self-pay | Admitting: Medical

## 2022-06-27 ENCOUNTER — Other Ambulatory Visit (HOSPITAL_COMMUNITY): Payer: Self-pay

## 2022-06-27 MED ORDER — ALPRAZOLAM 0.5 MG PO TABS
0.2500 mg | ORAL_TABLET | Freq: Two times a day (BID) | ORAL | 3 refills | Status: DC | PRN
  Filled 2022-06-27: qty 60, 30d supply, fill #0

## 2022-06-29 ENCOUNTER — Other Ambulatory Visit (HOSPITAL_COMMUNITY): Payer: Self-pay

## 2022-07-06 ENCOUNTER — Other Ambulatory Visit: Payer: Self-pay

## 2022-07-10 ENCOUNTER — Ambulatory Visit (INDEPENDENT_AMBULATORY_CARE_PROVIDER_SITE_OTHER): Payer: 59

## 2022-07-10 DIAGNOSIS — E538 Deficiency of other specified B group vitamins: Secondary | ICD-10-CM

## 2022-07-10 MED ORDER — CYANOCOBALAMIN 1000 MCG/ML IJ SOLN
1000.0000 ug | Freq: Once | INTRAMUSCULAR | Status: AC
  Administered 2022-07-10: 1000 ug via INTRAMUSCULAR

## 2022-07-10 NOTE — Progress Notes (Addendum)
Pt here for monthly B12 injection per Elise Benne  B12 109mg given IM, left deltoid and pt tolerated injection well.  EMackie Pai PA-C

## 2022-07-12 ENCOUNTER — Other Ambulatory Visit (HOSPITAL_COMMUNITY): Payer: Self-pay

## 2022-07-12 MED ORDER — HYDROCODONE-ACETAMINOPHEN 10-325 MG PO TABS
ORAL_TABLET | ORAL | 0 refills | Status: DC
  Filled 2022-08-14: qty 180, 30d supply, fill #0

## 2022-07-12 MED ORDER — HYDROCODONE-ACETAMINOPHEN 10-325 MG PO TABS
1.0000 | ORAL_TABLET | ORAL | 0 refills | Status: DC | PRN
  Filled 2022-07-12 – 2022-07-13 (×2): qty 180, 30d supply, fill #0

## 2022-07-13 ENCOUNTER — Other Ambulatory Visit (HOSPITAL_BASED_OUTPATIENT_CLINIC_OR_DEPARTMENT_OTHER): Payer: Self-pay

## 2022-07-13 ENCOUNTER — Other Ambulatory Visit: Payer: Self-pay

## 2022-07-13 ENCOUNTER — Other Ambulatory Visit (HOSPITAL_COMMUNITY): Payer: Self-pay

## 2022-07-16 ENCOUNTER — Other Ambulatory Visit (HOSPITAL_COMMUNITY): Payer: Self-pay

## 2022-07-21 ENCOUNTER — Other Ambulatory Visit: Payer: Self-pay | Admitting: Medical

## 2022-08-01 ENCOUNTER — Other Ambulatory Visit (HOSPITAL_COMMUNITY): Payer: Self-pay

## 2022-08-06 ENCOUNTER — Encounter: Payer: Self-pay | Admitting: Physician Assistant

## 2022-08-10 ENCOUNTER — Other Ambulatory Visit (HOSPITAL_COMMUNITY): Payer: Self-pay

## 2022-08-13 ENCOUNTER — Ambulatory Visit (HOSPITAL_BASED_OUTPATIENT_CLINIC_OR_DEPARTMENT_OTHER)
Admission: RE | Admit: 2022-08-13 | Discharge: 2022-08-13 | Disposition: A | Discharge status: Home / Self Care | Payer: 59 | Source: Ambulatory Visit | Attending: Acute Care | Admitting: Acute Care

## 2022-08-13 DIAGNOSIS — Z87891 Personal history of nicotine dependence: Secondary | ICD-10-CM

## 2022-08-13 DIAGNOSIS — F17210 Nicotine dependence, cigarettes, uncomplicated: Secondary | ICD-10-CM | POA: Diagnosis not present

## 2022-08-14 ENCOUNTER — Other Ambulatory Visit (HOSPITAL_COMMUNITY): Payer: Self-pay

## 2022-08-15 ENCOUNTER — Other Ambulatory Visit: Payer: Self-pay | Admitting: Acute Care

## 2022-08-15 DIAGNOSIS — F17210 Nicotine dependence, cigarettes, uncomplicated: Secondary | ICD-10-CM

## 2022-08-15 DIAGNOSIS — Z87891 Personal history of nicotine dependence: Secondary | ICD-10-CM

## 2022-08-15 DIAGNOSIS — Z122 Encounter for screening for malignant neoplasm of respiratory organs: Secondary | ICD-10-CM

## 2022-09-01 ENCOUNTER — Other Ambulatory Visit: Payer: Self-pay | Admitting: Internal Medicine

## 2022-09-12 ENCOUNTER — Other Ambulatory Visit: Payer: Self-pay

## 2022-09-12 ENCOUNTER — Other Ambulatory Visit: Payer: Self-pay | Admitting: Medical

## 2022-09-12 ENCOUNTER — Other Ambulatory Visit (HOSPITAL_COMMUNITY): Payer: Self-pay

## 2022-09-12 MED ORDER — HYDROCODONE-ACETAMINOPHEN 10-325 MG PO TABS
1.0000 | ORAL_TABLET | ORAL | 0 refills | Status: DC | PRN
  Filled 2022-09-12: qty 180, 30d supply, fill #0

## 2022-09-12 MED ORDER — HYDROCODONE-ACETAMINOPHEN 10-325 MG PO TABS
1.0000 | ORAL_TABLET | ORAL | 0 refills | Status: DC | PRN
  Filled 2022-10-15: qty 180, 30d supply, fill #0

## 2022-09-12 MED ORDER — ALPRAZOLAM 0.5 MG PO TABS
0.2500 mg | ORAL_TABLET | Freq: Two times a day (BID) | ORAL | 3 refills | Status: DC | PRN
  Filled 2022-09-12: qty 60, 30d supply, fill #0
  Filled 2022-12-14: qty 60, 30d supply, fill #1

## 2022-09-14 ENCOUNTER — Other Ambulatory Visit (HOSPITAL_COMMUNITY): Payer: Self-pay

## 2022-09-17 ENCOUNTER — Encounter: Payer: Self-pay | Admitting: *Deleted

## 2022-10-15 ENCOUNTER — Other Ambulatory Visit (HOSPITAL_COMMUNITY): Payer: Self-pay

## 2022-10-18 ENCOUNTER — Other Ambulatory Visit: Payer: Self-pay | Admitting: Medical

## 2022-11-05 ENCOUNTER — Other Ambulatory Visit (HOSPITAL_COMMUNITY): Payer: Self-pay

## 2022-11-05 ENCOUNTER — Telehealth: Payer: Self-pay | Admitting: Internal Medicine

## 2022-11-05 NOTE — Telephone Encounter (Signed)
Any of those substitutes is fine

## 2022-11-05 NOTE — Telephone Encounter (Signed)
PA team can you please advise on what's covered by insurance. Patient is stating albuterol inhaler is no longer covered.  Please route back to triage

## 2022-11-05 NOTE — Telephone Encounter (Signed)
Dr. Sherene Sires please advise? Ventolin is not covered by insurance any longer. PA team has advised Albuterol is covered if run through for the ProAir (8.5g) or Proventil (6.7g)

## 2022-11-05 NOTE — Telephone Encounter (Signed)
Called pt to get her reschedule and she informed me her albuteroll is no longer covered through her ins. Her ins will cover albuterol sulfate.

## 2022-11-05 NOTE — Telephone Encounter (Signed)
Per test claim, Albuterol is covered if run through for the ProAir (8.5g) or Proventil (6.7g). 18g Ventolin is not covered

## 2022-11-07 MED ORDER — ALBUTEROL SULFATE HFA 108 (90 BASE) MCG/ACT IN AERS: 1.0000 | INHALATION_SPRAY | Freq: Four times a day (QID) | RESPIRATORY_TRACT | 4 refills | Status: DC | PRN

## 2022-11-07 NOTE — Telephone Encounter (Signed)
Called and spoke with patient. She is aware that the Proventil has been sent in for her.   Nothing further needed at time of call.

## 2022-11-12 ENCOUNTER — Other Ambulatory Visit (HOSPITAL_COMMUNITY): Payer: Self-pay

## 2022-11-12 ENCOUNTER — Ambulatory Visit: Payer: Commercial Managed Care - PPO | Admitting: Internal Medicine

## 2022-11-12 MED ORDER — HYDROCODONE-ACETAMINOPHEN 10-325 MG PO TABS
ORAL_TABLET | ORAL | 0 refills | Status: DC
  Filled 2022-12-14: qty 180, 30d supply, fill #0

## 2022-11-12 MED ORDER — HYDROCODONE-ACETAMINOPHEN 10-325 MG PO TABS
ORAL_TABLET | ORAL | 0 refills | Status: DC
  Filled 2022-11-12: qty 180, 30d supply, fill #0

## 2022-11-14 ENCOUNTER — Other Ambulatory Visit (HOSPITAL_COMMUNITY): Payer: Self-pay

## 2022-11-26 ENCOUNTER — Other Ambulatory Visit: Payer: Self-pay | Admitting: Physician Assistant

## 2022-12-14 ENCOUNTER — Other Ambulatory Visit (HOSPITAL_COMMUNITY): Payer: Self-pay

## 2022-12-18 ENCOUNTER — Other Ambulatory Visit: Payer: Self-pay

## 2022-12-27 ENCOUNTER — Other Ambulatory Visit: Payer: Self-pay | Admitting: Medical

## 2022-12-27 ENCOUNTER — Other Ambulatory Visit: Payer: Self-pay | Admitting: Physician Assistant

## 2023-01-14 ENCOUNTER — Ambulatory Visit: Payer: 59 | Admitting: Internal Medicine

## 2023-01-14 ENCOUNTER — Other Ambulatory Visit (HOSPITAL_COMMUNITY): Payer: Self-pay

## 2023-01-14 MED ORDER — HYDROCODONE-ACETAMINOPHEN 10-325 MG PO TABS
1.0000 | ORAL_TABLET | ORAL | 0 refills | Status: DC | PRN
  Filled 2023-01-14: qty 180, 30d supply, fill #0

## 2023-01-14 MED ORDER — HYDROCODONE-ACETAMINOPHEN 10-325 MG PO TABS
1.0000 | ORAL_TABLET | ORAL | 0 refills | Status: DC | PRN
  Filled 2023-02-14: qty 180, 30d supply, fill #0

## 2023-01-22 ENCOUNTER — Encounter: Payer: Self-pay | Admitting: Physician Assistant

## 2023-01-23 ENCOUNTER — Other Ambulatory Visit: Payer: Self-pay | Admitting: Physical Medicine and Rehabilitation

## 2023-01-23 DIAGNOSIS — M5416 Radiculopathy, lumbar region: Secondary | ICD-10-CM

## 2023-01-26 ENCOUNTER — Other Ambulatory Visit: Payer: Self-pay | Admitting: Medical

## 2023-01-28 ENCOUNTER — Telehealth: Payer: Self-pay | Admitting: Medical

## 2023-01-28 MED ORDER — ROSUVASTATIN CALCIUM 10 MG PO TABS: 10.0000 mg | ORAL_TABLET | Freq: Every day | ORAL | 8 refills | Status: DC

## 2023-01-28 MED ORDER — HYDROCHLOROTHIAZIDE 12.5 MG PO TABS: 12.5000 mg | ORAL_TABLET | Freq: Every day | ORAL | 0 refills | Status: DC

## 2023-01-28 NOTE — Telephone Encounter (Signed)
Pt scheduled follow up for 02/05/23. Please advise if able to send in a refill to get her through until her appt.

## 2023-01-28 NOTE — Telephone Encounter (Signed)
Called pt to verify Rx. Rx sent  Valerie Key [facility]

## 2023-01-28 NOTE — Addendum Note (Signed)
Addended by: Maximino Sarin on: 01/28/2023 11:51 AM   Modules accepted: Orders

## 2023-01-30 ENCOUNTER — Encounter: Payer: Self-pay | Admitting: Physical Medicine and Rehabilitation

## 2023-02-02 ENCOUNTER — Ambulatory Visit
Admission: RE | Admit: 2023-02-02 | Discharge: 2023-02-02 | Disposition: A | Discharge status: Home / Self Care | Payer: 59 | Source: Ambulatory Visit | Attending: Physical Medicine and Rehabilitation | Admitting: Physical Medicine and Rehabilitation

## 2023-02-02 DIAGNOSIS — M5416 Radiculopathy, lumbar region: Secondary | ICD-10-CM

## 2023-02-05 ENCOUNTER — Encounter: Payer: Self-pay | Admitting: Medical

## 2023-02-05 ENCOUNTER — Ambulatory Visit: Payer: 59 | Admitting: Medical

## 2023-02-05 VITALS — BP 137/77 | HR 88 | Resp 18 | Ht 64.0 in | Wt 146.0 lb

## 2023-02-05 DIAGNOSIS — Z124 Encounter for screening for malignant neoplasm of cervix: Secondary | ICD-10-CM | POA: Diagnosis not present

## 2023-02-05 DIAGNOSIS — Z13220 Encounter for screening for lipoid disorders: Secondary | ICD-10-CM

## 2023-02-05 DIAGNOSIS — R739 Hyperglycemia, unspecified: Secondary | ICD-10-CM

## 2023-02-05 DIAGNOSIS — Z0000 Encounter for general adult medical examination without abnormal findings: Secondary | ICD-10-CM

## 2023-02-05 DIAGNOSIS — K130 Diseases of lips: Secondary | ICD-10-CM | POA: Diagnosis not present

## 2023-02-05 DIAGNOSIS — E538 Deficiency of other specified B group vitamins: Secondary | ICD-10-CM

## 2023-02-05 DIAGNOSIS — I10 Essential (primary) hypertension: Secondary | ICD-10-CM

## 2023-02-05 DIAGNOSIS — F419 Anxiety disorder, unspecified: Secondary | ICD-10-CM

## 2023-02-05 DIAGNOSIS — F17210 Nicotine dependence, cigarettes, uncomplicated: Secondary | ICD-10-CM

## 2023-02-05 DIAGNOSIS — Z1231 Encounter for screening mammogram for malignant neoplasm of breast: Secondary | ICD-10-CM | POA: Diagnosis not present

## 2023-02-05 DIAGNOSIS — E785 Hyperlipidemia, unspecified: Secondary | ICD-10-CM

## 2023-02-05 LAB — LIPID PANEL
Cholesterol: 159 mg/dL (ref 0–200)
HDL: 48.1 mg/dL (ref >39.00)
LDL Cholesterol: 69 mg/dL (ref 0–99)
NonHDL: 110.74
Total CHOL/HDL Ratio: 3
Triglycerides: 208 mg/dL — ABNORMAL HIGH (ref 0.0–149.0)
VLDL: 41.6 mg/dL — ABNORMAL HIGH (ref 0.0–40.0)

## 2023-02-05 LAB — COMPREHENSIVE METABOLIC PANEL
ALT: 12 U/L (ref 0–35)
AST: 14 U/L (ref 0–37)
Albumin: 4 g/dL (ref 3.5–5.2)
Alkaline Phosphatase: 65 U/L (ref 39–117)
BUN: 12 mg/dL (ref 6–23)
CO2: 35 meq/L — ABNORMAL HIGH (ref 19–32)
Calcium: 9.5 mg/dL (ref 8.4–10.5)
Chloride: 99 meq/L (ref 96–112)
Creatinine, Ser: 0.8 mg/dL (ref 0.40–1.20)
GFR: 80.21 mL/min (ref >60.00)
Glucose, Bld: 84 mg/dL (ref 70–99)
Potassium: 3.9 meq/L (ref 3.5–5.1)
Sodium: 143 meq/L (ref 135–145)
Total Bilirubin: 0.4 mg/dL (ref 0.2–1.2)
Total Protein: 6.7 g/dL (ref 6.0–8.3)

## 2023-02-05 LAB — CBC WITH DIFFERENTIAL/PLATELET
Basophils Absolute: 0.1 10*3/uL (ref 0.0–0.1)
Basophils Relative: 0.8 % (ref 0.0–3.0)
Eosinophils Absolute: 0.3 10*3/uL (ref 0.0–0.7)
Eosinophils Relative: 3.3 % (ref 0.0–5.0)
HCT: 41.5 % (ref 36.0–46.0)
Hemoglobin: 13.5 g/dL (ref 12.0–15.0)
Lymphocytes Relative: 30.1 % (ref 12.0–46.0)
Lymphs Abs: 2.6 10*3/uL (ref 0.7–4.0)
MCHC: 32.4 g/dL (ref 30.0–36.0)
MCV: 95 fl (ref 78.0–100.0)
Monocytes Absolute: 0.6 10*3/uL (ref 0.1–1.0)
Monocytes Relative: 6.8 % (ref 3.0–12.0)
Neutro Abs: 5 10*3/uL (ref 1.4–7.7)
Neutrophils Relative %: 59 % (ref 43.0–77.0)
Platelets: 270 10*3/uL (ref 150.0–400.0)
RBC: 4.37 Mil/uL (ref 3.87–5.11)
RDW: 13.9 % (ref 11.5–15.5)
WBC: 8.5 10*3/uL (ref 4.0–10.5)

## 2023-02-05 LAB — HEMOGLOBIN A1C: Hgb A1c MFr Bld: 6.2 % (ref 4.6–6.5)

## 2023-02-05 MED ORDER — NYSTATIN 100000 UNIT/GM EX CREA: 1.0000 | TOPICAL_CREAM | Freq: Two times a day (BID) | CUTANEOUS | 0 refills | Status: DC

## 2023-02-05 NOTE — Patient Instructions (Addendum)
For you wellness exam today I have ordered cbc, cmp and  lipid panel.  Vaccine declined but to consider near future tdap, flu and shingrix.  Colonsocopy up to date.  Referral for pap/gyn placed.  Ct screening lung cancer up to date  Recommend exercise and healthy diet.  We will let you know lab results as they come in.  Follow up date appointment will be determined after lab review.      Breast cancer screening by mammogram  - MM 3D SCREENING MAMMOGRAM BILATERAL BREAST; Future  Cervical cancer screening  - Ambulatory referral to Gynecology      Angular cheilitis Nystatin cream apply bid   B12 deficiency Recheck b12 level. Supplament if necessary on level check/review.   Elevated blood sugar A1c toay  Anxiety Continue celexa. Anxiety controlled/improved.  Hyperlipidemia, unspecified hyperlipidemia type Continue crestor. Discussed ct chest Coronary finding. Seen by cardiologist in 2022.   Cigarette smoker Recommend smoking sessation. Reduce cardiovascular risl   Hypertension, unspecified type  - bp better today but slight high. Check bp 3 times a week or more if symptomatic. Want to see close to 130/80 or less.

## 2023-02-05 NOTE — Progress Notes (Signed)
Subjective:    Patient ID: Valerie Key, female    DOB: August 27, 1960, 62 y.o.   MRN: [id number]  HPI  Pt in follow up. Has been on year since last seen. Decided to do wellness exam along with follow up on chronic med problems. She is fasting.   Pt defers shingrix today. She states will do on same day as flu vaccine in 3-5 weeks as discussed. Declines tdap as well today.  Pt admits she needs mammogram done. I placed order today.   Pt states had partial hysterectomy. She states some years since went to [facility]   New complaint mild rash corner her mouth for 2 weeks. Pt does wear dentures. She is putting on neosporin and still has rash. She takes out dentures at night.  Pt states stressed and anxious and stress much better than last year. Pt is still on celexa. Pt has low b12 and notes that when she got b12 shots she noted improvement in her mood.    Smoker- had written wellbutrin with hopes would quit smoking. Pt states still smoking pack a day. /   Htn- Bp better on recheck and you report when you check at other office systolic in 120 range. Pt only on hctz 12.5 mg daily.   Elevated sugar in past with a1c up to 6.3.   Hyperlipidemia- she is on crestor 10 mg daily and fenofibrate 48 mg daily.     Review of Systems  Constitutional:  Negative for fatigue.  HENT:  Negative for congestion.   Respiratory:  Negative for cough, choking, shortness of breath and wheezing.   Cardiovascular:  Negative for chest pain and palpitations.  Gastrointestinal:  Negative for abdominal pain.  Genitourinary:  Negative for dysuria, flank pain and hematuria.  Skin:  Negative for pallor and rash.  Neurological:  Negative for dizziness, speech difficulty and headaches.  Hematological:  Negative for adenopathy. Does not bruise/bleed easily.  Psychiatric/Behavioral:  Negative for behavioral problems, decreased concentration, dysphoric mood, sleep disturbance and suicidal ideas. The  patient is not nervous/anxious.    Past Medical History:  Diagnosis Date   Allergy    SEASONAL   Anxiety    Arthritis    Asthma    Back pain    Chronic pain syndrome    COPD (chronic obstructive pulmonary disease) (HCC)    Depression    Diverticulosis    Fatty liver    Gastritis    GERD (gastroesophageal reflux disease)    Hiatal hernia    History of kidney stones    Hyperlipidemia      Social History   Socioeconomic History   Marital status: Divorced    Spouse name: Not on file   Number of children: 2   Years of education: Not on file   Highest education level: 12th grade  Occupational History   Occupation: Marine scientist: [employer]  Tobacco Use   Smoking status: Every Day    Current packs/day: 0.50    Average packs/day: 0.5 packs/day for 39.0 years (19.5 ttl pk-yrs)    Types: Cigarettes   Smokeless tobacco: Never   Tobacco comments:    6 ciggs daily- 11/10/21 LMR  Vaping Use   Vaping status: Never Used  Substance and Sexual Activity   Alcohol use: Yes    Comment: 1-2 daily   Drug use: No   Sexual activity: Yes    Birth control/protection: Surgical  Other Topics Concern   Not  on file  Social History Narrative   Not on file   Social Determinants of Health   Financial Resource Strain: Low Risk  (02/01/2023)   Overall Financial Resource Strain (CARDIA)    Difficulty of Paying Living Expenses: Not very hard  Food Insecurity: No Food Insecurity (02/01/2023)   Hunger Vital Sign    Worried About Running Out of Food in the Last Year: Never true    Ran Out of Food in the Last Year: Never true  Transportation Needs: No Transportation Needs (02/01/2023)   PRAPARE - Administrator, Civil Service (Medical): No    Lack of Transportation (Non-Medical): No  Physical Activity: Insufficiently Active (02/01/2023)   Exercise Vital Sign    Days of Exercise per Week: 2 days    Minutes of Exercise per Session: 20 min  Stress: No Stress  Concern Present (02/01/2023)   [employer] of Occupational Health - Occupational Stress Questionnaire    Feeling of Stress : Only a little  Social Connections: Moderately Isolated (02/01/2023)   Social Connection and Isolation Panel [NHANES]    Frequency of Communication with Friends and Family: More than three times a week    Frequency of Social Gatherings with Friends and Family: Twice a week    Attends Religious Services: 1 to 4 times per year    Active Member of [employer] or Organizations: No    Attends Engineer, structural: Not on file    Marital Status: Divorced  Intimate Partner Violence: Not At Risk (06/28/2018)   Humiliation, Afraid, Rape, and Kick questionnaire    Fear of Current or Ex-Partner: No    Emotionally Abused: No    Physically Abused: No    Sexually Abused: No    Past Surgical History:  Procedure Laterality Date   ABDOMINAL HYSTERECTOMY     with bladder tact   BLADDER TACK     CHOLECYSTECTOMY N/A 07/26/2017   Procedure: LAPAROSCOPIC CHOLECYSTECTOMY;  Surgeon: Berna Bue, MD;  Location: [facility];  Service: General;  Laterality: N/A;    Family History  Problem Relation Age of Onset   Diabetes Father    Colon cancer Paternal Uncle        x 2   Heart disease Maternal Grandfather    Heart disease Paternal Grandfather     Allergies  Allergen Reactions   Sulfa Antibiotics Hives and Rash    Current Outpatient Medications on File Prior to Visit  Medication Sig Dispense Refill   vitamin B-12 (CYANOCOBALAMIN) 100 MCG tablet Take 100 mcg by mouth daily.     albuterol (PROVENTIL HFA) 108 (90 Base) MCG/ACT inhaler Inhale 1-2 puffs into the lungs every 6 (six) hours as needed for wheezing or shortness of breath. 6.7 g 4   albuterol (VENTOLIN HFA) 108 (90 Base) MCG/ACT inhaler INHALE 1 PUFF INTO THE LUNGS EVERY 4 HOURS AS NEEDED FOR WHEEZING OR SHORTNESS OF BREATH 8.5 each 5   ALPRAZolam (XANAX) 0.5 MG tablet Take 0.25 mg by mouth daily as needed for  anxiety.      ALPRAZolam (XANAX) 0.5 MG tablet Take 0.5-1 tablets (0.25-0.5 mg total) by mouth 2 (two) times daily as needed. 60 tablet 3   ALPRAZolam (XANAX) 0.5 MG tablet Take 1/2 - 1 tablet by mouth 2 times daily as needed. 60 tablet 3   aspirin EC 81 MG tablet Take 1 tablet (81 mg total) by mouth daily. Swallow whole. 90 tablet 3   budesonide-formoterol (SYMBICORT) 160-4.5 MCG/ACT inhaler INHALE 2  PUFFS BY MOUTH FIRST THING IN AM AND THEN ANOTHER 2 PUFFS ABOUT 12 HOURS LATER. 10.2 each 9   buPROPion (WELLBUTRIN) 75 MG tablet 1 tab po a day 30 tablet 0   Cholecalciferol (VITAMIN D3) 125 MCG (5000 UT) CAPS Take 1 capsule by mouth daily.     citalopram (CELEXA) 20 MG tablet TAKE 1 TABLET BY MOUTH EVERY DAY 30 tablet 3   fenofibrate (TRICOR) 48 MG tablet Take 1 tablet (48 mg total) by mouth daily. 30 tablet 0   hydrochlorothiazide (HYDRODIURIL) 12.5 MG tablet Take 1 tablet (12.5 mg total) by mouth daily. 30 tablet 0   HYDROcodone-acetaminophen (NORCO) 10-325 MG tablet Take 1 tablet by mouth every 4 (four) hours as needed.     HYDROcodone-acetaminophen (NORCO) 10-325 MG tablet Take 1 tablet by mouth every 4 (four) hours as needed. 180 tablet 0   HYDROcodone-acetaminophen (NORCO) 10-325 MG tablet Take 1 tablet by mouth every 4 (four) hours as needed. 180 tablet 0   HYDROcodone-acetaminophen (NORCO) 10-325 MG tablet Take 1 tablet by mouth every 4 (four) hours as needed. 180 tablet 0   HYDROcodone-acetaminophen (NORCO) 10-325 MG tablet Take 1 tablet by mouth every 4 (four) hours as needed. (fill 06/08/22) 180 tablet 0   HYDROcodone-acetaminophen (NORCO) 10-325 MG tablet Take 1 tablet by mouth every 4 (four) hours as needed. 180 tablet 0   HYDROcodone-acetaminophen (NORCO) 10-325 MG tablet Take 1 tablet by mouth every 4 hours as needed 180 tablet 0   HYDROcodone-acetaminophen (NORCO) 10-325 MG tablet Take 1 tablet by mouth every 4 (four) hours as needed. 180 tablet 0   HYDROcodone-acetaminophen (NORCO)  10-325 MG tablet Take 1 tablet by mouth every 4 (four) hours as needed. 180 tablet 0   HYDROcodone-acetaminophen (NORCO) 10-325 MG tablet Take 1 tablet by mouth every four hours as needed 180 tablet 0   HYDROcodone-acetaminophen (NORCO) 10-325 MG tablet Take 1 tablet by mouth every 4 hours as needed 12/10/22 180 tablet 0   [START ON 02/12/2023] HYDROcodone-acetaminophen (NORCO) 10-325 MG tablet Take 1 tablet by mouth every 4 (four) hours as needed. (fill 02/12/23) 180 tablet 0   HYDROcodone-acetaminophen (NORCO) 10-325 MG tablet Take 1 tablet by mouth every 4 (four) hours as needed. 180 tablet 0   levocetirizine (XYZAL) 5 MG tablet TAKE 1 TABLET BY MOUTH EVERYDAY AT BEDTIME 30 tablet 11   nicotine (NICODERM CQ - DOSED IN MG/24 HOURS) 21 mg/24hr patch Place 1 patch (21 mg total) onto the skin daily. 28 patch 3   pantoprazole (PROTONIX) 40 MG tablet TAKE 1 TABLET BY MOUTH EVERY DAY 30 tablet 2   rosuvastatin (CRESTOR) 10 MG tablet Take 1 tablet (10 mg total) by mouth daily. 30 tablet 8   Tiotropium Bromide Monohydrate (SPIRIVA RESPIMAT) 2.5 MCG/ACT AERS Inhale 2 puffs into the lungs daily. 4 g 9   vitamin E (VITAMIN E) 400 UNIT capsule Take 2 capsules (800 Units total) by mouth daily. 60 capsule 2   No current facility-administered medications on file prior to visit.    BP 137/77   Pulse 88   Resp 18   Ht 5\' 4"  (1.626 m)   Wt 146 lb (66.2 kg)   SpO2 98%   BMI 25.06 kg/m         Objective:   Physical Exam  General Mental Status- Alert. General Appearance- Not in acute distress.   Skin General: Color- Normal Color. Moisture- Normal Moisture.  Neck Carotid Arteries- Normal color. Moisture- Normal Moisture. No carotid bruits. No  JVD.  Chest and Lung Exam Auscultation: Breath Sounds:-Normal.  Cardiovascular Auscultation:Rythm- Regular. Murmurs & Other Heart Sounds:Auscultation of the heart reveals- No Murmurs.  Abdomen Inspection:-Inspeection  Normal. Palpation/Percussion:Note:No mass. Palpation and Percussion of the abdomen reveal- Non Tender, Non Distended + BS, no rebound or guarding.   Neurologic Cranial Nerve exam:- CN III-XII intact(No nystagmus), symmetric smile. Strength:- 5/5 equal and symmetric strength both upper and lower extremities.       Assessment & Plan:   Patient Instructions  For you wellness exam today I have ordered cbc, cmp and  lipid panel.  Vaccine declined but to consider near future tdap, flu and shingrix.  Colonsocopy up to date.  Referral for pap/gyn placed.  Ct screening lung cancer up to date  Recommend exercise and healthy diet.  We will let you know lab results as they come in.  Follow up date appointment will be determined after lab review.      Breast cancer screening by mammogram  - MM 3D SCREENING MAMMOGRAM BILATERAL BREAST; Future  Cervical cancer screening  - Ambulatory referral to Gynecology      Angular cheilitis Nystatin cream apply bid  5. B12 deficiency Recheck b12 level. Supplament if necessary on level check/review.  6. Elevated blood sugar A1c toay  Anxiety Continue celexa. Anxiety controlled/improved.  Hyperlipidemia, unspecified hyperlipidemia type Continue crestor. Discussed ct chest Coronary finding. Seen by cardiologist in 2022.   Cigarette smoker Recommend smoking sessation. Reduce cardiovascular risl   Hypertension, unspecified type  - bp better today but slight high. Check bp 3 times a week or more if symptomatic. Want to see close to 130/80 or less.     Esperanza Richters, [place]   [id number] charge as did address htn, high cholesterol, angular cheiliti, elevated sugar, cigarrette smoking and b12 deficicency.

## 2023-02-06 LAB — VITAMIN B12: Vitamin B-12: 391 pg/mL (ref 211–911)

## 2023-02-07 MED ORDER — FENOFIBRATE 48 MG PO TABS
48.0000 mg | ORAL_TABLET | Freq: Every day | ORAL | 3 refills | Status: DC
Start: 2023-02-07 — End: 2023-06-10

## 2023-02-07 NOTE — Addendum Note (Signed)
Addended by: Gwenevere Abbot on: 02/07/2023 06:43 AM   Modules accepted: Orders

## 2023-02-14 ENCOUNTER — Other Ambulatory Visit: Payer: Self-pay

## 2023-02-14 ENCOUNTER — Other Ambulatory Visit (HOSPITAL_COMMUNITY): Payer: Self-pay

## 2023-02-19 ENCOUNTER — Telehealth (HOSPITAL_BASED_OUTPATIENT_CLINIC_OR_DEPARTMENT_OTHER): Payer: Self-pay

## 2023-02-24 ENCOUNTER — Other Ambulatory Visit: Payer: Self-pay | Admitting: Medical

## 2023-03-07 ENCOUNTER — Ambulatory Visit: Payer: 59 | Admitting: Internal Medicine

## 2023-03-11 ENCOUNTER — Other Ambulatory Visit (HOSPITAL_COMMUNITY): Payer: Self-pay

## 2023-03-11 MED ORDER — HYDROCODONE-ACETAMINOPHEN 10-325 MG PO TABS
1.0000 | ORAL_TABLET | ORAL | 0 refills | Status: DC | PRN
Start: 2023-04-09 — End: 2023-10-11
  Filled 2023-04-12: qty 180, 30d supply, fill #0

## 2023-03-11 MED ORDER — HYDROCODONE-ACETAMINOPHEN 10-325 MG PO TABS
1.0000 | ORAL_TABLET | ORAL | 0 refills | Status: DC | PRN
  Filled 2023-03-14: qty 180, 30d supply, fill #0

## 2023-03-14 ENCOUNTER — Other Ambulatory Visit (HOSPITAL_COMMUNITY): Payer: Self-pay

## 2023-03-15 ENCOUNTER — Other Ambulatory Visit (HOSPITAL_COMMUNITY): Payer: Self-pay

## 2023-03-26 ENCOUNTER — Other Ambulatory Visit: Payer: Self-pay | Admitting: Physician Assistant

## 2023-03-31 ENCOUNTER — Other Ambulatory Visit: Payer: Self-pay | Admitting: Medical

## 2023-04-05 ENCOUNTER — Ambulatory Visit: Payer: 59 | Admitting: Physician Assistant

## 2023-04-12 ENCOUNTER — Other Ambulatory Visit (HOSPITAL_COMMUNITY): Payer: Self-pay

## 2023-04-15 ENCOUNTER — Other Ambulatory Visit (HOSPITAL_COMMUNITY): Payer: Self-pay

## 2023-04-27 ENCOUNTER — Other Ambulatory Visit: Payer: Self-pay | Admitting: Internal Medicine

## 2023-04-30 ENCOUNTER — Other Ambulatory Visit: Payer: Self-pay | Admitting: Medical

## 2023-04-30 ENCOUNTER — Other Ambulatory Visit: Payer: Self-pay | Admitting: Internal Medicine

## 2023-05-09 ENCOUNTER — Other Ambulatory Visit (HOSPITAL_COMMUNITY): Payer: Self-pay

## 2023-05-09 MED ORDER — MELOXICAM 15 MG PO TABS
15.0000 mg | ORAL_TABLET | Freq: Every day | ORAL | 0 refills | Status: DC
  Filled 2023-05-09: qty 30, 30d supply, fill #0

## 2023-05-09 MED ORDER — HYDROCODONE-ACETAMINOPHEN 10-325 MG PO TABS
ORAL_TABLET | ORAL | 0 refills | Status: DC
  Filled 2023-06-10: qty 180, 30d supply, fill #0

## 2023-05-09 MED ORDER — HYDROCODONE-ACETAMINOPHEN 10-325 MG PO TABS
ORAL_TABLET | ORAL | 0 refills | Status: DC
  Filled 2023-05-09 – 2023-05-10 (×2): qty 180, 30d supply, fill #0

## 2023-05-10 ENCOUNTER — Other Ambulatory Visit (HOSPITAL_COMMUNITY): Payer: Self-pay

## 2023-05-15 ENCOUNTER — Other Ambulatory Visit (HOSPITAL_COMMUNITY): Payer: Self-pay

## 2023-05-27 ENCOUNTER — Other Ambulatory Visit: Payer: Self-pay | Admitting: Medical

## 2023-05-28 ENCOUNTER — Ambulatory Visit: Payer: 59 | Admitting: Internal Medicine

## 2023-06-10 ENCOUNTER — Ambulatory Visit: Payer: 59 | Admitting: Physician Assistant

## 2023-06-10 ENCOUNTER — Encounter: Payer: Self-pay | Admitting: Physician Assistant

## 2023-06-10 ENCOUNTER — Other Ambulatory Visit (HOSPITAL_COMMUNITY): Payer: Self-pay

## 2023-06-10 VITALS — BP 110/70 | HR 97 | Ht 64.0 in | Wt 148.4 lb

## 2023-06-10 DIAGNOSIS — K760 Fatty (change of) liver, not elsewhere classified: Secondary | ICD-10-CM | POA: Diagnosis not present

## 2023-06-10 DIAGNOSIS — R140 Abdominal distension (gaseous): Secondary | ICD-10-CM | POA: Diagnosis not present

## 2023-06-10 DIAGNOSIS — K219 Gastro-esophageal reflux disease without esophagitis: Secondary | ICD-10-CM

## 2023-06-10 MED ORDER — PANTOPRAZOLE SODIUM 40 MG PO TBEC: 40.0000 mg | DELAYED_RELEASE_TABLET | Freq: Every day | ORAL | 3 refills | Status: DC

## 2023-06-10 NOTE — Progress Notes (Signed)
 Subjective:    Patient ID: Valerie Key, female    DOB: [birth year], 63 y.o.   MRN: [id number]  HPI Valerie Key is a pleasant 63 year old white female, established with Dr. Albertus last seen in the office in June 2023 by myself.  She has been followed for chronic GERD, diverticulosis and has history of fatty liver disease. Other chronic medical problems include COPD, asthma, hypertension, anxiety and B12 deficiency. She comes in today for routine follow-up, and med refill. She has been on Protonix  40 mg p.o. every morning chronically and says this works very well to control her reflux symptoms.  She is not having any active issues with heartburn or indigestion, no dysphagia or odynophagia.  She does mention that she has been having a lot of issues with bloating lately which is intermittent usually postprandially and felt in her upper abdomen.  She is wondering if increasing the dose of her medication may help.  She has also had some increase in gassiness.  She has not had any recent medication changes or dietary changes.  She does say that Gas-X is helpful. No current lower GI complaints. She is status post cholecystectomy. Last colonoscopy was done in 2019 with finding of sigmoid diverticulosis and 1 inflammatory rectal polyp and is indicated for 10-year interval follow-up. She also had EGD in November 2019 with normal-appearing esophagus and 2 cm hiatal hernia otherwise negative exam.  Ultrasound was done June 2023, status postcholecystectomy, CBD 7.9 mm, and noted diffuse increased echotexture throughout the liver no focal mass, portal vein patent.  Most recent LFTs September 2024 were normal and CBC at that time also normal.  Review of Systems Pertinent positive and negative review of systems were noted in the above HPI section.  All other review of systems was otherwise negative.   Outpatient Encounter Medications as of 06/10/2023  Medication Sig   albuterol  (VENTOLIN  HFA) 108 (90  Base) MCG/ACT inhaler INHALE 1 PUFF INTO THE LUNGS EVERY 4 HOURS AS NEEDED FOR WHEEZING OR SHORTNESS OF BREATH   albuterol  (VENTOLIN  HFA) 108 (90 Base) MCG/ACT inhaler INHALE 1-2 PUFFS BY MOUTH EVERY 6 HOURS AS NEEDED FOR WHEEZE OR SHORTNESS OF BREATH   ALPRAZolam  (XANAX ) 0.5 MG tablet Take 0.25 mg by mouth daily as needed for anxiety.    ALPRAZolam  (XANAX ) 0.5 MG tablet Take 0.5-1 tablets (0.25-0.5 mg total) by mouth 2 (two) times daily as needed.   ALPRAZolam  (XANAX ) 0.5 MG tablet Take 1/2 - 1 tablet by mouth 2 times daily as needed.   aspirin  EC 81 MG tablet Take 1 tablet (81 mg total) by mouth daily. Swallow whole.   budesonide -formoterol  (SYMBICORT ) 160-4.5 MCG/ACT inhaler INHALE 2 PUFFS BY MOUTH FIRST THING IN AM AND THEN ANOTHER 2 PUFFS ABOUT 12 HOURS LATER.   Cholecalciferol (VITAMIN D3) 125 MCG (5000 UT) CAPS Take 1 capsule by mouth daily.   citalopram  (CELEXA ) 20 MG tablet TAKE 1 TABLET BY MOUTH EVERY DAY   hydrochlorothiazide  (HYDRODIURIL ) 12.5 MG tablet TAKE 1 TABLET BY MOUTH EVERY DAY   HYDROcodone -acetaminophen  (NORCO) 10-325 MG tablet Take 1 tablet by mouth every 4 (four) hours as needed.   HYDROcodone -acetaminophen  (NORCO) 10-325 MG tablet Take 1 tablet by mouth every 4 (four) hours as needed.   HYDROcodone -acetaminophen  (NORCO) 10-325 MG tablet Take 1 tablet by mouth every 4 (four) hours as needed.   HYDROcodone -acetaminophen  (NORCO) 10-325 MG tablet Take 1 tablet by mouth every 4 (four) hours as needed.   HYDROcodone -acetaminophen  (NORCO) 10-325 MG tablet Take 1  tablet by mouth every 4 (four) hours as needed. (fill 06/08/22)   HYDROcodone -acetaminophen  (NORCO) 10-325 MG tablet Take 1 tablet by mouth every 4 (four) hours as needed.   HYDROcodone -acetaminophen  (NORCO) 10-325 MG tablet Take 1 tablet by mouth every 4 hours as needed   HYDROcodone -acetaminophen  (NORCO) 10-325 MG tablet Take 1 tablet by mouth every 4 (four) hours as needed.   HYDROcodone -acetaminophen  (NORCO) 10-325 MG  tablet Take 1 tablet by mouth every 4 (four) hours as needed.   HYDROcodone -acetaminophen  (NORCO) 10-325 MG tablet Take 1 tablet by mouth every four hours as needed   HYDROcodone -acetaminophen  (NORCO) 10-325 MG tablet Take 1 tablet by mouth every 4 hours as needed 12/10/22   HYDROcodone -acetaminophen  (NORCO) 10-325 MG tablet Take 1 tablet by mouth every 4 (four) hours as needed. (fill 02/12/23)   HYDROcodone -acetaminophen  (NORCO) 10-325 MG tablet Take 1 tablet by mouth every 4 (four) hours as needed.   HYDROcodone -acetaminophen  (NORCO) 10-325 MG tablet Take 1 tablet by mouth every 4 (four) hours as needed.   HYDROcodone -acetaminophen  (NORCO) 10-325 MG tablet Take 1 tablet by mouth every 4 (four) hours as needed.   HYDROcodone -acetaminophen  (NORCO) 10-325 MG tablet Take 1 tablet by mouth every four hours as needed   HYDROcodone -acetaminophen  (NORCO) 10-325 MG tablet Take 1 tablet by mouth every four hours as needed   levocetirizine (XYZAL ) 5 MG tablet TAKE 1 TABLET BY MOUTH EVERYDAY AT BEDTIME   meloxicam  (MOBIC ) 15 MG tablet Take 1 tablet (15 mg total) by mouth daily.   nicotine  (NICODERM CQ  - DOSED IN MG/24 HOURS) 21 mg/24hr patch Place 1 patch (21 mg total) onto the skin daily.   nystatin  cream (MYCOSTATIN ) Apply 1 Application topically 2 (two) times daily.   Tiotropium Bromide  Monohydrate (SPIRIVA  RESPIMAT) 2.5 MCG/ACT AERS Inhale 2 puffs into the lungs daily.   vitamin B-12 (CYANOCOBALAMIN ) 100 MCG tablet Take 100 mcg by mouth daily.   vitamin E  (VITAMIN E ) 400 UNIT capsule Take 2 capsules (800 Units total) by mouth daily.   [DISCONTINUED] pantoprazole  (PROTONIX ) 40 MG tablet TAKE 1 TABLET BY MOUTH EVERY DAY   pantoprazole  (PROTONIX ) 40 MG tablet Take 1 tablet (40 mg total) by mouth daily.   rosuvastatin  (CRESTOR ) 10 MG tablet Take 1 tablet (10 mg total) by mouth daily.   [DISCONTINUED] buPROPion  (WELLBUTRIN ) 75 MG tablet 1 tab po a day   [DISCONTINUED] fenofibrate  (TRICOR ) 48 MG tablet Take 1  tablet (48 mg total) by mouth daily.   [DISCONTINUED] fenofibrate  (TRICOR ) 48 MG tablet Take 1 tablet (48 mg total) by mouth daily.   No facility-administered encounter medications on file as of 06/10/2023.   Allergies  Allergen Reactions   Sulfa Antibiotics Hives and Rash   Patient Active Problem List   Diagnosis Date Noted   Patellofemoral pain syndrome of left knee 11/08/2020   History of kidney stones    Hiatal hernia    Gastritis    Fatty liver    Diverticulosis    COPD (chronic obstructive pulmonary disease) (HCC)    Chronic pain syndrome    Back pain    Asthma    Arthritis    Allergy    Essential hypertension 07/17/2018   COPD GOLD 3 07/16/2018   Acute respiratory failure with hypoxia (HCC) 06/28/2018   Anxiety 06/28/2018   COPD with acute exacerbation (HCC) 06/28/2018   Depression 06/28/2018   GERD (gastroesophageal reflux disease) 06/28/2018   Hyperlipidemia 06/28/2018   Cigarette smoker 06/28/2018   Dyspepsia 02/12/2012   Degenerative disc disease 01/02/2012  Chronic back pain 01/02/2012   Social History   Socioeconomic History   Marital status: Divorced    Spouse name: Not on file   Number of children: 2   Years of education: Not on file   Highest education level: 12th grade  Occupational History   Occupation: Marine Scientist: [employer]  Tobacco Use   Smoking status: Every Day    Current packs/day: 0.50    Average packs/day: 0.5 packs/day for 39.0 years (19.5 ttl pk-yrs)    Types: Cigarettes   Smokeless tobacco: Never   Tobacco comments:    6 ciggs daily- 11/10/21 LMR  Vaping Use   Vaping status: Never Used  Substance and Sexual Activity   Alcohol use: Yes    Comment: 1-2 daily   Drug use: No   Sexual activity: Yes    Birth control/protection: Surgical  Other Topics Concern   Not on file  Social History Narrative   Not on file   Social Drivers of Health   Financial Resource Strain: Low Risk  (02/01/2023)   Overall  Financial Resource Strain (CARDIA)    Difficulty of Paying Living Expenses: Not very hard  Food Insecurity: No Food Insecurity (02/01/2023)   Hunger Vital Sign    Worried About Running Out of Food in the Last Year: Never true    Ran Out of Food in the Last Year: Never true  Transportation Needs: No Transportation Needs (02/01/2023)   PRAPARE - Administrator, Civil Service (Medical): No    Lack of Transportation (Non-Medical): No  Physical Activity: Insufficiently Active (02/01/2023)   Exercise Vital Sign    Days of Exercise per Week: 2 days    Minutes of Exercise per Session: 20 min  Stress: No Stress Concern Present (02/01/2023)   [employer] of Occupational Health - Occupational Stress Questionnaire    Feeling of Stress : Only a little  Social Connections: Moderately Isolated (02/01/2023)   Social Connection and Isolation Panel [NHANES]    Frequency of Communication with Friends and Family: More than three times a week    Frequency of Social Gatherings with Friends and Family: Twice a week    Attends Religious Services: 1 to 4 times per year    Active Member of [employer] or Organizations: No    Attends Engineer, Structural: Not on file    Marital Status: Divorced  Intimate Partner Violence: Not At Risk (06/28/2018)   Humiliation, Afraid, Rape, and Kick questionnaire    Fear of Current or Ex-Partner: No    Emotionally Abused: No    Physically Abused: No    Sexually Abused: No    Ms. Haydon's family history includes Colon cancer in her paternal uncle; Diabetes in her father; Heart disease in her maternal grandfather and paternal grandfather.      Objective:    Vitals:   06/10/23 1454  BP: 110/70  Pulse: 97    Physical Exam.Well-developed well-nourished white female in no acute distress.  Height, Weight 148, BMI 25.4  HEENT; nontraumatic normocephalic, EOMI, PE R LA, sclera anicteric. Oropharynx; not examined today Neck; supple, no JVD Cardiovascular;  regular rate and rhythm with S1-S2, no murmur rub or gallop Pulmonary; Clear bilaterally Abdomen; soft, nontender, nondistended, no palpable mass or hepatosplenomegaly, bowel sounds are active Rectal; not done today Skin; benign exam, no jaundice rash or appreciable lesions Extremities; no clubbing cyanosis or edema skin warm and dry Neuro/Psych; alert and oriented x4, grossly nonfocal mood and  affect appropriate        Assessment & Plan:   #8 63 year old white female with history of chronic GERD, well-controlled on Protonix  40 mg p.o. every morning #2 intermittent abdominal bloating-likely dietary  #3 colon cancer screening-up-to-date with last colonoscopy 2019 showing diverticulosis and 1 inflammatory polyp/indicated for 10-year interval follow-up #4.  Nonalcoholic fatty liver disease/MASLD #5 COPD 6.  Asthma 7.  Hypertension  Plan; refill Protonix  40 mg p.o. every morning AC breakfast x 1 year Continue antireflux regimen Patient will be given a low gas diet to review possible dietary contributors to intermittent bloating, we have reviewed avoiding carbonated beverages and artificial sweeteners which she does not use regularly Gas-X or Phazyme as needed She will be scheduled for abdominal ultrasound with elastography to further risk stratify her fatty liver disease. She should follow-up with Dr. Albertus in 1 year or sooner as needed.   Crayton Savarese GORMAN Corti PA-C 06/10/2023   Cc: Saguier, Edward, PA-C

## 2023-06-10 NOTE — Patient Instructions (Addendum)
 _______________________________________________________  If your blood pressure at your visit was 140/90 or greater, please contact your primary care physician to follow up on this.  _______________________________________________________  If you are age 61 or older, your body mass index should be between 23-30. Your Body mass index is 25.47 kg/m. If this is out of the aforementioned range listed, please consider follow up with your Primary Care Provider.  If you are age 73 or younger, your body mass index should be between 19-25. Your Body mass index is 25.47 kg/m. If this is out of the aformentioned range listed, please consider follow up with your Primary Care Provider.   ________________________________________________________  The [facility] providers would like to encourage you to use MYCHART to communicate with providers for non-urgent requests or questions.  Due to long hold times on the telephone, sending your provider a message by [facility] may be a faster and more efficient way to get a response.  Please allow 48 business hours for a response.  Please remember that this is for non-urgent requests.  _______________________________________________________  We have sent the following medications to your pharmacy for you to pick up at your convenience: Protonix   You have been scheduled for an abdominal ultrasound at [facility] (1st floor of hospital) on 06-18-23 at 9am. Please arrive 30 minutes prior to your appointment for registration. Make certain not to have anything to eat or drink midnight prior to your appointment. Should you need to reschedule your appointment, please contact radiology at [phone]. This test typically takes about 30 minutes to perform.  It was a pleasure to see you today!  Thank you for trusting me with your gastrointestinal care!

## 2023-06-11 NOTE — Progress Notes (Signed)
 Addendum: Reviewed and agree with assessment and management plan. Asha Grumbine, Carie Caddy, MD

## 2023-06-18 ENCOUNTER — Ambulatory Visit (HOSPITAL_COMMUNITY): Admission: RE | Admit: 2023-06-18 | Payer: 59 | Source: Ambulatory Visit

## 2023-06-20 ENCOUNTER — Other Ambulatory Visit: Payer: Self-pay | Admitting: Internal Medicine

## 2023-06-23 ENCOUNTER — Other Ambulatory Visit: Payer: Self-pay | Admitting: Medical

## 2023-06-23 NOTE — Progress Notes (Unsigned)
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, agricultural / adulthood including 2 IUP's last in 1992 with new pattern of recurrent uri/bronchitis/ wheezing x 2015 and fine in between on prn saba rarely needed but then admitted for first time with aecopd in setting of viral uri :  Admit date: 06/28/2018 Discharge date: 07/02/2018   Discharge Diagnoses:      Acute respiratory failure with hypoxia (HCC)  Chronic back pain  Anxiety  COPD with acute exacerbation (HCC)  Depression  GERD (gastroesophageal reflux disease)  Hyperlipidemia  Tobacco use   History of Present Illness  07/16/2018  Pulmonary/ 1st office eval/Valerie Key / active smoking  Chief Complaint  Patient presents with   Pulmonary Consult    Referred by Dr Isidoro Donning for eval of COPD. Pt states her breathing is "great". She stopped Breo approx 1 wk ago due to thrush. She has a rescue inhaler that she uses once every 2 wks approx.   Dyspnea:  More doe x MMRC2 = can't walk a nl pace on a flat grade s sob but does fine slow and flat Cough: minimum in am  Sleep: bed is flat with big pillow SABA use: back to baseline / rarely need  rec The key is to stop smoking completely before smoking completely stops you! - For smoking cessation classes call [phone]   Plan A = Automatic = Symbicort 80 Take 2 puffs first thing in am and then another 2 puffs about 12 hours later.  Work on inhaler technique:   Plan B = Backup Only use your albuterol inhaler(proair) as a rescue medication  Please schedule a follow up office visit in 4 weeks, sooner if needed with PFTs on return     08/15/2018 Referral: Lung cancer screening clinic Medications: Add Spiriva 1.25- take 2 puffs once daily (sample given, if helps let us know will send in RX) Continue Symbicort - take 2 puffs twice daily Back up - Albuterol rescue inhaler as needed  every 4 hours for shortness of breath/wheezing  Follow-up: 3-4 months with Dr. Sherene Sires or sooner if breathing worsening  Due for Pneumococcal 23 vaccine at next visits (d/t smoking hx and age)    11/17/2018  f/u ov/Valerie Key re: COPD  3 on symb 80 and spiriva 1.25 x 2  Chief Complaint  Patient presents with   Follow-up    Breathing is doing well and no new co's. She is using her albuterol inhaler once per month on average.   Dyspnea:  MMRC2 = can't walk a nl pace on a flat grade s sob but does fine slow and flat eg Cough: minimal am  Sleeping: bed flat/ 2 pillows  Due to reflux  SABA use: rarely 02: none  rec Increase spiriva  2.5 mcg  X 2 puffs each am  Work on inhaler technique:   Good luck with the stop smoking    06/08/2019  f/u ov/Valerie Key re:  GOLD 3  / still smoking on symb 160/spiriva 2.5  Chief Complaint  Patient presents with   Follow-up    COPD, no symptoms  Dyspnea:  Varies a lot week to week, typically stops after a mile on a bad day and finish the mile on good day Cough: denies but did cough occ  during exam/interview Sleeping: ok on side 2 pillows flat bed  SABA use: once  every few weeks 02: none  Overt hb despite ppi rec Plan A = Automatic = Always=    symbicort increase to  160 Take 2 puffs first thing in am and then another 2 puffs about 12 hours later and spiriva 2.5 x 2 puffs first thing in am Plan B = Backup (to supplement plan A, not to replace it) Only use your albuterol inhaler as a rescue medication The key is to stop smoking completely before smoking completely stops you!   Changed from lisinopril to just just HCTZ around 12/2019   03/16/2020  f/u ov/Valerie Key re: GOLD 2 copd/ still smoking Chief Complaint  Patient presents with   Follow-up    Denies any problems with breathing  Dyspnea:   Walks up 30 min s stopping  Cough: some in am's  Sleeping: on side with 2 pillows  SABA use: rarely  02: none   Rec Stop smoking /yearly f/u   11/10/2021  f/u ov/Valerie Key/  [facility] re: GOLD 3   maint on symbicort/spiriva  Chief Complaint  Patient presents with   Follow-up    Breathing is some better since the last visit. She is using her albuterol inhaler 1-2 x per wk on average.   Dyspnea:  walking 1-2 miles 2-3 x per wek Cough: slt rattle  Sleeping: bed is flat/ 2 pillows on side with cpap' SABA use: couple times a week 02: none  Covid status:  vax x 2  Rec    06/25/2023  f/u ov/Valerie Key re: GOLD 3 copd    maint on ***  No chief complaint on file.   Dyspnea:  *** Cough: *** Sleeping: *** resp cc  SABA use: *** 02: ***  Lung cancer screening :  ***    No obvious day to day or daytime variability or assoc excess/ purulent sputum or mucus plugs or hemoptysis or cp or chest tightness, subjective wheeze or overt sinus or hb symptoms.    Also denies any obvious fluctuation of symptoms with weather or environmental changes or other aggravating or alleviating factors except as outlined above   No unusual exposure hx or h/o childhood pna/ asthma or knowledge of premature birth.  Current Allergies, Complete Past Medical History, Past Surgical History, Family History, and Social History were reviewed in [employer] record.  ROS  The following are not active complaints unless bolded Hoarseness, sore throat, dysphagia, dental problems, itching, sneezing,  nasal congestion or discharge of excess mucus or purulent secretions, ear ache,   fever, chills, sweats, unintended wt loss or wt gain, classically pleuritic or exertional cp,  orthopnea pnd or arm/hand swelling  or leg swelling, presyncope, palpitations, abdominal pain, anorexia, nausea, vomiting, diarrhea  or change in bowel habits or change in bladder habits, change in stools or change in urine, dysuria, hematuria,  rash, arthralgias, visual complaints, headache, numbness, weakness or ataxia or problems with walking or coordination,  change in mood or  memory.        No  outpatient medications have been marked as taking for the 06/25/23 encounter (Appointment) with Nyoka Cowden, MD.                      Objective:     Wts  06/25/2023        ***   11/10/2021         156  03/16/2020      154 06/08/2019          155  11/17/18 154 lb 3.2 oz (69.9 kg)  08/15/18 153 lb (69.4 kg)  07/16/18 152 lb 9.6 oz (69.2 kg)      Vital signs reviewed  06/25/2023  - Note at rest 02 sats  ***% on ***   General appearance:    ***      Edentulous   Mod bar***                  Assessment

## 2023-06-24 ENCOUNTER — Other Ambulatory Visit: Payer: Self-pay | Admitting: Physician Assistant

## 2023-06-25 ENCOUNTER — Ambulatory Visit (HOSPITAL_COMMUNITY): Admission: RE | Admit: 2023-06-25 | Payer: 59 | Source: Ambulatory Visit

## 2023-06-25 ENCOUNTER — Encounter (HOSPITAL_COMMUNITY): Payer: Self-pay

## 2023-06-25 ENCOUNTER — Encounter: Payer: Self-pay | Admitting: Internal Medicine

## 2023-06-25 ENCOUNTER — Ambulatory Visit: Payer: 59 | Admitting: Internal Medicine

## 2023-06-25 VITALS — BP 128/76 | HR 95 | Temp 98.4°F | Ht 64.0 in | Wt 151.2 lb

## 2023-06-25 DIAGNOSIS — F17210 Nicotine dependence, cigarettes, uncomplicated: Secondary | ICD-10-CM | POA: Diagnosis not present

## 2023-06-25 DIAGNOSIS — J449 Chronic obstructive pulmonary disease, unspecified: Secondary | ICD-10-CM | POA: Diagnosis not present

## 2023-06-25 NOTE — Assessment & Plan Note (Addendum)
Active smoker/MM - alpha one AT levels 06/14/17 = 156  (MM proven 06/08/19) - Spirometry 07/16/2018  FEV1 1.4 (53%)  Ratio 0.58 typical curvature   - 07/16/2018  After extensive coaching inhaler device,  effectiveness =    75% with hfa so try symbicort 160 2bid - PFTs 08/15/2018    FEV1 1.12  (41 % ) ratio 0.48  p no % improvement from saba p symb 80 x 2  prior to study with DLCO  78/78c % corrects to 105  % for alv volume  And copd curvature on f/v loop > spiriva 1.25 x 2 added  - 11/17/2018   > continue symb 80 and increase spiriva to 2.5 x 2 puffs daily  - 06/08/2019  After extensive coaching inhaler device,  effectiveness =    90% increase symb to 160 2bid  - Alpha one Phenotype 06/08/2019  = MM - 11/10/2021  After extensive coaching inhaler device,  effectiveness = 90%     > try breztri instead of sym/spiriva (full dose of latter caused dry mouth so may only tol breztri one bid) -  Chest LDSCT     3/121/24  Moderate centrilobular emphysema.   Group D (now reclassified as E) in terms of symptom/risk and laba/lama/ICS  therefore appropriate rx at this point >>>  symbicort 160/ spiriva each am / approp saba   No change in rx needed - f/u yearly          Each maintenance medication was reviewed in detail including emphasizing most importantly the difference between maintenance and prns and under what circumstances the prns are to be triggered using an action plan format where appropriate.  Total time for H and P, chart review, counseling, reviewing hfa/smie device(s) and generating customized AVS unique to this office visit / same day charting = 20 min

## 2023-06-25 NOTE — Patient Instructions (Signed)
The key is to stop smoking completely before smoking completely stops you!  Keep appt for your CT scan    Please schedule a follow up visit in 12  months but call sooner if needed

## 2023-06-25 NOTE — Assessment & Plan Note (Signed)
Enrolled in LDCT program - CT 02/26/20   Lung-RADS 2  - CT 08/11/2021 reviewed/ benign x for emphysema - CT 08/23/22  RADS 2   4-5 min discussion re active cigarette smoking in addition to office E&M  Ask about tobacco use:   ongoing  Advise quitting   I took an extended  opportunity with this patient to outline the consequences of continued cigarette use  in airway disorders based on all the data we have from the multiple national lung health studies (perfomed over decades at millions of dollars in cost)  indicating that smoking cessation, not choice of inhalers or pulmonary physicians, is the most important aspect of her  care.   Assess willingness:  Not committed at this point Assist in quit attempt:  Per PCP when ready Arrange follow up:   Follow up per Primary Care planned

## 2023-07-08 ENCOUNTER — Other Ambulatory Visit: Payer: Self-pay

## 2023-07-08 ENCOUNTER — Other Ambulatory Visit (HOSPITAL_COMMUNITY): Payer: Self-pay

## 2023-07-08 MED ORDER — HYDROCODONE-ACETAMINOPHEN 10-325 MG PO TABS
1.0000 | ORAL_TABLET | ORAL | 0 refills | Status: DC | PRN
  Filled 2023-08-08: qty 180, 30d supply, fill #0

## 2023-07-08 MED ORDER — HYDROCODONE-ACETAMINOPHEN 10-325 MG PO TABS
1.0000 | ORAL_TABLET | ORAL | 0 refills | Status: DC | PRN
  Filled 2023-07-08: qty 180, 30d supply, fill #0

## 2023-07-11 ENCOUNTER — Other Ambulatory Visit (HOSPITAL_COMMUNITY): Payer: Self-pay

## 2023-07-20 ENCOUNTER — Other Ambulatory Visit: Payer: Self-pay | Admitting: Medical

## 2023-07-25 ENCOUNTER — Other Ambulatory Visit: Payer: Self-pay | Admitting: Internal Medicine

## 2023-07-25 ENCOUNTER — Other Ambulatory Visit: Payer: Self-pay | Admitting: Medical

## 2023-07-26 ENCOUNTER — Other Ambulatory Visit: Payer: Self-pay | Admitting: Internal Medicine

## 2023-08-08 ENCOUNTER — Other Ambulatory Visit (HOSPITAL_COMMUNITY): Payer: Self-pay

## 2023-08-14 ENCOUNTER — Ambulatory Visit (HOSPITAL_BASED_OUTPATIENT_CLINIC_OR_DEPARTMENT_OTHER)
Admission: RE | Admit: 2023-08-14 | Discharge: 2023-08-14 | Disposition: A | Discharge status: Home / Self Care | Payer: 59 | Source: Ambulatory Visit | Attending: Acute Care | Admitting: Acute Care

## 2023-08-14 DIAGNOSIS — Z87891 Personal history of nicotine dependence: Secondary | ICD-10-CM | POA: Diagnosis present

## 2023-08-14 DIAGNOSIS — Z122 Encounter for screening for malignant neoplasm of respiratory organs: Secondary | ICD-10-CM | POA: Insufficient documentation

## 2023-08-14 DIAGNOSIS — F17210 Nicotine dependence, cigarettes, uncomplicated: Secondary | ICD-10-CM | POA: Insufficient documentation

## 2023-09-03 ENCOUNTER — Other Ambulatory Visit (HOSPITAL_COMMUNITY): Payer: Self-pay

## 2023-09-03 MED ORDER — HYDROCODONE-ACETAMINOPHEN 10-325 MG PO TABS
1.0000 | ORAL_TABLET | ORAL | 0 refills | Status: DC | PRN
  Filled 2023-09-03 – 2023-09-05 (×2): qty 180, 30d supply, fill #0

## 2023-09-03 MED ORDER — HYDROCODONE-ACETAMINOPHEN 10-325 MG PO TABS
1.0000 | ORAL_TABLET | ORAL | 0 refills | Status: DC | PRN
  Filled 2023-10-07: qty 180, 30d supply, fill #0

## 2023-09-04 ENCOUNTER — Other Ambulatory Visit (HOSPITAL_COMMUNITY): Payer: Self-pay

## 2023-09-05 ENCOUNTER — Other Ambulatory Visit (HOSPITAL_COMMUNITY): Payer: Self-pay

## 2023-09-09 ENCOUNTER — Other Ambulatory Visit (HOSPITAL_COMMUNITY): Payer: Self-pay

## 2023-09-09 ENCOUNTER — Other Ambulatory Visit: Payer: Self-pay | Admitting: Acute Care

## 2023-09-09 ENCOUNTER — Encounter: Payer: Self-pay | Admitting: Medical

## 2023-09-09 DIAGNOSIS — Z87891 Personal history of nicotine dependence: Secondary | ICD-10-CM

## 2023-09-09 DIAGNOSIS — F17210 Nicotine dependence, cigarettes, uncomplicated: Secondary | ICD-10-CM

## 2023-09-09 DIAGNOSIS — Z122 Encounter for screening for malignant neoplasm of respiratory organs: Secondary | ICD-10-CM

## 2023-09-09 MED ORDER — ROSUVASTATIN CALCIUM 10 MG PO TABS: 10.0000 mg | ORAL_TABLET | Freq: Every day | ORAL | 3 refills | Status: DC

## 2023-09-09 NOTE — Addendum Note (Signed)
 Addended by: Gwenevere Abbot on: 09/09/2023 08:51 PM   Modules accepted: Orders

## 2023-09-16 ENCOUNTER — Other Ambulatory Visit: Payer: Self-pay | Admitting: Medical

## 2023-09-27 ENCOUNTER — Other Ambulatory Visit: Payer: Self-pay | Admitting: Medical

## 2023-10-07 ENCOUNTER — Other Ambulatory Visit (HOSPITAL_COMMUNITY): Payer: Self-pay

## 2023-10-11 ENCOUNTER — Encounter: Payer: Self-pay | Admitting: Physician Assistant

## 2023-10-11 ENCOUNTER — Ambulatory Visit (INDEPENDENT_AMBULATORY_CARE_PROVIDER_SITE_OTHER): Admitting: Physician Assistant

## 2023-10-11 VITALS — BP 144/82 | HR 90 | Temp 97.8°F | Ht 64.0 in | Wt 146.0 lb

## 2023-10-11 DIAGNOSIS — H66001 Acute suppurative otitis media without spontaneous rupture of ear drum, right ear: Secondary | ICD-10-CM | POA: Diagnosis not present

## 2023-10-11 DIAGNOSIS — B379 Candidiasis, unspecified: Secondary | ICD-10-CM

## 2023-10-11 DIAGNOSIS — T3695XA Adverse effect of unspecified systemic antibiotic, initial encounter: Secondary | ICD-10-CM

## 2023-10-11 MED ORDER — FLUCONAZOLE 150 MG PO TABS: 150.0000 mg | ORAL_TABLET | Freq: Once | ORAL | 0 refills | Status: AC

## 2023-10-11 MED ORDER — FLUTICASONE PROPIONATE 50 MCG/ACT NA SUSP: 2.0000 | Freq: Every day | NASAL | 1 refills | Status: DC

## 2023-10-11 MED ORDER — AMOXICILLIN-POT CLAVULANATE 875-125 MG PO TABS: 1.0000 | ORAL_TABLET | Freq: Two times a day (BID) | ORAL | 0 refills | Status: AC

## 2023-10-11 NOTE — Progress Notes (Signed)
 Established patient visit   Patient: Valerie Key   DOB: 01-15-1961   62 y.o. Female  MRN: [id number] Visit Date: 10/11/2023  Today's healthcare provider: Trenton Frock, PA-C   Cc. Congestion, right ear clogged  Subjective     Pt reports cough, congestion, reports right ear discomfort x 5 days, initially painful and some ear drops. Overall sinus congestion.  History of COPD, reports allergies are worse, denies wheezing, SOB, needing her inhalers more often.   Medications: Outpatient Medications Prior to Visit  Medication Sig   albuterol  (VENTOLIN  HFA) 108 (90 Base) MCG/ACT inhaler INHALE 1-2 PUFFS BY MOUTH EVERY 6 HOURS AS NEEDED FOR WHEEZE OR SHORTNESS OF BREATH   ALPRAZolam  (XANAX ) 0.5 MG tablet Take 0.25 mg by mouth daily as needed for anxiety.    aspirin  EC 81 MG tablet Take 1 tablet (81 mg total) by mouth daily. Swallow whole.   budesonide -formoterol  (SYMBICORT ) 160-4.5 MCG/ACT inhaler Inhale 2 puffs into the lungs every 12 (twelve) hours. MUST KEEP APPOINTMENT   Cholecalciferol (VITAMIN D3) 125 MCG (5000 UT) CAPS Take 1 capsule by mouth daily.   citalopram  (CELEXA ) 20 MG tablet TAKE 1 TABLET BY MOUTH EVERY DAY   hydrochlorothiazide  (HYDRODIURIL ) 12.5 MG tablet TAKE 1 TABLET BY MOUTH EVERY DAY   HYDROcodone -acetaminophen  (NORCO) 10-325 MG tablet Take 1 tablet by mouth every 4 (four) hours as needed. 10/01/23   levocetirizine (XYZAL ) 5 MG tablet TAKE 1 TABLET BY MOUTH EVERYDAY AT BEDTIME   meloxicam  (MOBIC ) 15 MG tablet Take 1 tablet (15 mg total) by mouth daily.   pantoprazole  (PROTONIX ) 40 MG tablet Take 1 tablet (40 mg total) by mouth daily.   rosuvastatin  (CRESTOR ) 10 MG tablet Take 1 tablet (10 mg total) by mouth daily.   Tiotropium Bromide  Monohydrate (SPIRIVA  RESPIMAT) 2.5 MCG/ACT AERS Inhale 2 puffs into the lungs daily.   vitamin B-12 (CYANOCOBALAMIN ) 100 MCG tablet Take 100 mcg by mouth daily.   vitamin E  (VITAMIN E ) 400 UNIT capsule Take 2  capsules (800 Units total) by mouth daily.   [DISCONTINUED] HYDROcodone -acetaminophen  (NORCO) 10-325 MG tablet Take 1 tablet by mouth every 4 (four) hours as needed. (Patient not taking: Reported on 06/25/2023)   [DISCONTINUED] HYDROcodone -acetaminophen  (NORCO) 10-325 MG tablet Take 1 tablet by mouth every 4 (four) hours as needed. (Patient not taking: Reported on 06/25/2023)   [DISCONTINUED] HYDROcodone -acetaminophen  (NORCO) 10-325 MG tablet Take 1 tablet by mouth every 4 (four) hours as needed. (Patient not taking: Reported on 06/25/2023)   [DISCONTINUED] HYDROcodone -acetaminophen  (NORCO) 10-325 MG tablet Take 1 tablet by mouth every 4 (four) hours as needed. (Patient not taking: Reported on 06/25/2023)   [DISCONTINUED] HYDROcodone -acetaminophen  (NORCO) 10-325 MG tablet Take 1 tablet by mouth every 4 (four) hours as needed. (fill 06/08/22) (Patient not taking: Reported on 06/25/2023)   [DISCONTINUED] HYDROcodone -acetaminophen  (NORCO) 10-325 MG tablet Take 1 tablet by mouth every 4 (four) hours as needed. (Patient not taking: Reported on 06/25/2023)   [DISCONTINUED] HYDROcodone -acetaminophen  (NORCO) 10-325 MG tablet Take 1 tablet by mouth every 4 hours as needed (Patient not taking: Reported on 06/25/2023)   [DISCONTINUED] HYDROcodone -acetaminophen  (NORCO) 10-325 MG tablet Take 1 tablet by mouth every 4 (four) hours as needed. (Patient not taking: Reported on 06/25/2023)   [DISCONTINUED] HYDROcodone -acetaminophen  (NORCO) 10-325 MG tablet Take 1 tablet by mouth every 4 (four) hours as needed. (Patient not taking: Reported on 06/25/2023)   [DISCONTINUED] HYDROcodone -acetaminophen  (NORCO) 10-325 MG tablet Take 1 tablet by mouth every four hours as needed (Patient not  taking: Reported on 06/25/2023)   [DISCONTINUED] HYDROcodone -acetaminophen  (NORCO) 10-325 MG tablet Take 1 tablet by mouth every 4 hours as needed 12/10/22 (Patient not taking: Reported on 06/25/2023)   [DISCONTINUED] HYDROcodone -acetaminophen  (NORCO)  10-325 MG tablet Take 1 tablet by mouth every 4 (four) hours as needed. (fill 02/12/23) (Patient not taking: Reported on 06/25/2023)   [DISCONTINUED] HYDROcodone -acetaminophen  (NORCO) 10-325 MG tablet Take 1 tablet by mouth every 4 (four) hours as needed. (Patient not taking: Reported on 06/25/2023)   [DISCONTINUED] HYDROcodone -acetaminophen  (NORCO) 10-325 MG tablet Take 1 tablet by mouth every 4 (four) hours as needed. (Patient not taking: Reported on 06/25/2023)   [DISCONTINUED] HYDROcodone -acetaminophen  (NORCO) 10-325 MG tablet Take 1 tablet by mouth every 4 (four) hours as needed. (Patient not taking: Reported on 06/25/2023)   [DISCONTINUED] HYDROcodone -acetaminophen  (NORCO) 10-325 MG tablet Take 1 tablet by mouth every four hours as needed (Patient not taking: Reported on 06/25/2023)   [DISCONTINUED] HYDROcodone -acetaminophen  (NORCO) 10-325 MG tablet Take 1 tablet by mouth every four hours as needed   [DISCONTINUED] HYDROcodone -acetaminophen  (NORCO) 10-325 MG tablet Take 1 tablet by mouth every 4 (four) hours as needed.   [DISCONTINUED] HYDROcodone -acetaminophen  (NORCO) 10-325 MG tablet Take 1 tablet by mouth every 4 (four) hours as needed.   [DISCONTINUED] rosuvastatin  (CRESTOR ) 10 MG tablet Take 1 tablet (10 mg total) by mouth daily.   No facility-administered medications prior to visit.    Review of Systems  Constitutional:  Negative for fatigue and fever.  HENT:  Positive for congestion and ear pain.   Respiratory:  Positive for cough. Negative for shortness of breath.   Cardiovascular:  Negative for chest pain and leg swelling.  Gastrointestinal:  Negative for abdominal pain.  Neurological:  Negative for dizziness and headaches.       Objective    BP (!) 144/82   Pulse 90   Temp 97.8 F (36.6 C)   Ht 5\' 4"  (1.626 m)   Wt 146 lb (66.2 kg)   SpO2 96%   BMI 25.06 kg/m    Physical Exam Constitutional:      General: She is awake.     Appearance: She is well-developed.  HENT:      Head: Normocephalic.     Ears:     Comments: Right TM bulging, erythematous, fluid appears hazy.  Left TM bulging with injection Eyes:     Conjunctiva/sclera: Conjunctivae normal.  Cardiovascular:     Rate and Rhythm: Normal rate and regular rhythm.     Heart sounds: Normal heart sounds.  Pulmonary:     Effort: Pulmonary effort is normal.     Breath sounds: Normal breath sounds.  Skin:    General: Skin is warm.  Neurological:     Mental Status: She is alert and oriented to person, place, and time.  Psychiatric:        Attention and Perception: Attention normal.        Mood and Affect: Mood normal.        Speech: Speech normal.        Behavior: Behavior is cooperative.     No results found for any visits on 10/11/23.  Assessment & Plan    Non-recurrent acute suppurative otitis media of right ear without spontaneous rupture of tympanic membrane -     Fluticasone  Propionate; Place 2 sprays into both nostrils daily.  Dispense: 16 g; Refill: 1 -     Amoxicillin-Pot Clavulanate; Take 1 tablet by mouth 2 (two) times daily for 7 days.  Dispense: 14  tablet; Refill: 0  Antibiotic-induced yeast infection -     Fluconazole; Take 1 tablet (150 mg total) by mouth once for 1 dose.  Dispense: 1 tablet; Refill: 0    Rx augmentin bid x 7 days, cont antihistamines, saline rinses, rx flonase.  Rx diflucan prn yeast infection symptoms.   Return if symptoms worsen or fail to improve.       Trenton Frock, PA-C  [facility] at [facility] [phone] (phone) [phone] (fax)  [facility]

## 2023-10-14 ENCOUNTER — Other Ambulatory Visit: Payer: Self-pay | Admitting: Medical

## 2023-10-24 ENCOUNTER — Other Ambulatory Visit: Payer: Self-pay | Admitting: Internal Medicine

## 2023-10-29 ENCOUNTER — Other Ambulatory Visit: Payer: Self-pay | Admitting: Medical

## 2023-10-29 ENCOUNTER — Other Ambulatory Visit: Payer: Self-pay | Admitting: Internal Medicine

## 2023-11-04 ENCOUNTER — Other Ambulatory Visit (HOSPITAL_COMMUNITY): Payer: Self-pay

## 2023-11-04 MED ORDER — HYDROCODONE-ACETAMINOPHEN 10-325 MG PO TABS
1.0000 | ORAL_TABLET | ORAL | 0 refills | Status: DC | PRN
  Filled 2023-11-06: qty 180, 30d supply, fill #0

## 2023-11-04 MED ORDER — HYDROCODONE-ACETAMINOPHEN 10-325 MG PO TABS
1.0000 | ORAL_TABLET | ORAL | 0 refills | Status: DC | PRN
  Filled 2023-12-05 (×2): qty 180, 30d supply, fill #0

## 2023-11-05 ENCOUNTER — Other Ambulatory Visit (HOSPITAL_COMMUNITY): Payer: Self-pay

## 2023-11-06 ENCOUNTER — Other Ambulatory Visit (HOSPITAL_COMMUNITY): Payer: Self-pay

## 2023-11-12 ENCOUNTER — Ambulatory Visit: Payer: Self-pay

## 2023-11-12 NOTE — Telephone Encounter (Addendum)
 FYI Only or Action Required?: FYI only for provider  Patient was last seen in primary care on 02/05/2023 by Sylvia Everts, PA-C. Called Nurse Triage reporting Shortness of Breath and Cough. Symptoms began several days ago. Interventions attempted: Rest, hydration, or home remedies. Symptoms are: unchanged.  Triage Disposition: See PCP Within 2 Weeks  Patient/caregiver understands and will follow disposition?:                     Copied from CRM [id number]. Topic: Clinical - Red Word Triage >> Nov 12, 2023  2:43 PM [place] J wrote: [facility] that prompted transfer to Nurse Triage: Patient has COPD and has been having shortness of breathe and headaches. Reason for Disposition  [1] MILD longstanding difficulty breathing AND [2]  SAME as normal  Answer Assessment - Initial Assessment Questions 1. RESPIRATORY STATUS: "Describe your breathing?" (e.g., wheezing, shortness of breath, unable to speak, severe coughing)      Cough, yellow phlegm, mild SOB (with activity) 2. ONSET: "When did this breathing problem begin?"      Saturday  3. PATTERN "Does the difficult breathing come and go, or has it been constant since it started?"      Comes and goes 4. SEVERITY: "How bad is your breathing?" (e.g., mild, moderate, severe)    - MILD: No SOB at rest, mild SOB with walking, speaks normally in sentences, can lie down, no retractions, pulse < 100.    - MODERATE: SOB at rest, SOB with minimal exertion and prefers to sit, cannot lie down flat, speaks in phrases, mild retractions, audible wheezing, pulse 100-120.    - SEVERE: Very SOB at rest, speaks in single words, struggling to breathe, sitting hunched forward, retractions, pulse > 120      SOB with activity and with sitting down after activity, denies getting SOB at rest without having recently performed activity 5. RECURRENT SYMPTOM: "Have you had difficulty breathing before?" If Yes, ask: "When was the last time?" and "What happened that  time?"      yes 6. CARDIAC HISTORY: "Do you have any history of heart disease?" (e.g., heart attack, angina, bypass surgery, angioplasty)      HTN 7. LUNG HISTORY: "Do you have any history of lung disease?"  (e.g., pulmonary embolus, asthma, emphysema)     COPD 8. CAUSE: "What do you think is causing the breathing problem?"      COPD, sinus infection 9. OTHER SYMPTOMS: "Do you have any other symptoms? (e.g., dizziness, runny nose, cough, chest pain, fever)     "I don't know if I have a sinus infection", started Saturday  Yellow sputum  SOB was worse Saturday, is getting better now  Denies fever, denies CP  H/A, intermittent, frequent  Denies dizziness/weakness  Endorses runny nose   10. O2 SATURATION MONITOR:  "Do you use an oxygen saturation monitor (pulse oximeter) at home?" If Yes, ask: "What is your reading (oxygen level) today?" "What is your usual oxygen saturation reading?" (e.g., 95%)       N/a - doesn't check  Protocols used: Breathing Difficulty-A-AH

## 2023-11-13 ENCOUNTER — Ambulatory Visit (HOSPITAL_BASED_OUTPATIENT_CLINIC_OR_DEPARTMENT_OTHER)
Admission: RE | Admit: 2023-11-13 | Discharge: 2023-11-13 | Disposition: A | Discharge status: Home / Self Care | Source: Ambulatory Visit | Attending: Medical | Admitting: Medical

## 2023-11-13 ENCOUNTER — Ambulatory Visit (INDEPENDENT_AMBULATORY_CARE_PROVIDER_SITE_OTHER): Admitting: Medical

## 2023-11-13 VITALS — BP 128/60 | HR 80 | Temp 98.1°F | Resp 18 | Ht 64.0 in | Wt 139.8 lb

## 2023-11-13 DIAGNOSIS — F17200 Nicotine dependence, unspecified, uncomplicated: Secondary | ICD-10-CM | POA: Diagnosis not present

## 2023-11-13 DIAGNOSIS — R062 Wheezing: Secondary | ICD-10-CM

## 2023-11-13 DIAGNOSIS — J209 Acute bronchitis, unspecified: Secondary | ICD-10-CM

## 2023-11-13 DIAGNOSIS — R059 Cough, unspecified: Secondary | ICD-10-CM

## 2023-11-13 DIAGNOSIS — J440 Chronic obstructive pulmonary disease with acute lower respiratory infection: Secondary | ICD-10-CM | POA: Diagnosis not present

## 2023-11-13 LAB — CBC WITH DIFFERENTIAL/PLATELET
Absolute Lymphocytes: 2245 {cells}/uL (ref 850–3900)
Absolute Monocytes: 709 {cells}/uL (ref 200–950)
Basophils Absolute: 44 {cells}/uL (ref 0–200)
Basophils Relative: 0.4 %
Eosinophils Absolute: 98 {cells}/uL (ref 15–500)
Eosinophils Relative: 0.9 %
HCT: 40.8 % (ref 35.0–45.0)
Hemoglobin: 12.7 g/dL (ref 11.7–15.5)
MCH: 29.3 pg (ref 27.0–33.0)
MCHC: 31.1 g/dL — ABNORMAL LOW (ref 32.0–36.0)
MCV: 94 fL (ref 80.0–100.0)
MPV: 9.8 fL (ref 7.5–12.5)
Monocytes Relative: 6.5 %
Neutro Abs: 7804 {cells}/uL — ABNORMAL HIGH (ref 1500–7800)
Neutrophils Relative %: 71.6 %
Platelets: 289 10*3/uL (ref 140–400)
RBC: 4.34 10*6/uL (ref 3.80–5.10)
RDW: 12.6 % (ref 11.0–15.0)
Total Lymphocyte: 20.6 %
WBC: 10.9 10*3/uL — ABNORMAL HIGH (ref 3.8–10.8)

## 2023-11-13 MED ORDER — CEFDINIR 300 MG PO CAPS: 300.0000 mg | ORAL_CAPSULE | Freq: Two times a day (BID) | ORAL | 0 refills | Status: DC

## 2023-11-13 MED ORDER — BENZONATATE 100 MG PO CAPS: 100.0000 mg | ORAL_CAPSULE | Freq: Three times a day (TID) | ORAL | 0 refills | Status: DC | PRN

## 2023-11-13 MED ORDER — METHYLPREDNISOLONE 4 MG PO TABS
ORAL_TABLET | ORAL | 0 refills | Status: DC
Start: 2023-11-13 — End: 2024-02-26

## 2023-11-13 NOTE — Progress Notes (Signed)
 Subjective:    Patient ID: Valerie Key, female    DOB: 1960-12-09, 63 y.o.   MRN: [id number]  HPI Valerie Key is a 63 year old female with COPD and coronary artery disease who presents with shortness of breath and yellow sputum.  She has been experiencing mild shortness of breath/wheezing and coughing up yellow sputum, which worsened over the weekend with wheezing but has since improved. She continues to feel chest congestion and is bringing up mucus. Some maxillary  sinus pain, but she describes a 'funny' feeling in her head, suggestive of congestion. She sees colored mucus when blowing her nose.  A similar episode occurred about a month ago, treated with a nasal spray, Augmentin , and Diflucan , leading to improvement. However, symptoms reappeared suddenly over the weekend. No fevers, chills, or sweats are reported.  She smokes about a pack a day and has done so for over thirty years. A CT scan in April 2025 for lung cancer screening revealed coronary artery calcification. She has not seen a cardiologist since 2022, when coronary artery calcification was noted on a CT scan. On crestor   Her current medications include Symbicort , Albuterol , and Spiriva  inhalers. She reports increased use of Albuterol , up to five to seven times a day recently, compared to her usual one to two times a day. She has not used it today.   Review of Systems  Constitutional:  Negative for chills, fatigue and fever.  HENT:  Positive for congestion, sinus pressure and sinus pain.   Respiratory:  Positive for cough and wheezing. Negative for chest tightness and shortness of breath.   Cardiovascular:  Negative for chest pain and palpitations.  Gastrointestinal:  Negative for abdominal pain.  Genitourinary:  Negative for dysuria.  Musculoskeletal:  Negative for back pain and joint swelling.  Neurological:  Negative for facial asymmetry, speech difficulty, weakness and light-headedness.   Hematological:  Negative for adenopathy. Does not bruise/bleed easily.  Psychiatric/Behavioral:  Negative for behavioral problems and confusion. The patient is not nervous/anxious.     Past Medical History:  Diagnosis Date   Allergy    SEASONAL   Anxiety    Arthritis    Asthma    Back pain    Chronic pain syndrome    COPD (chronic obstructive pulmonary disease) (HCC)    Depression    Diverticulosis    Fatty liver    Gastritis    GERD (gastroesophageal reflux disease)    Hiatal hernia    History of kidney stones    Hyperlipidemia      Social History   Socioeconomic History   Marital status: Divorced    Spouse name: Not on file   Number of children: 2   Years of education: Not on file   Highest education level: 12th grade  Occupational History   Occupation: Marine scientist: [employer]  Tobacco Use   Smoking status: Every Day    Current packs/day: 0.50    Average packs/day: 0.5 packs/day for 39.0 years (19.5 ttl pk-yrs)    Types: Cigarettes   Smokeless tobacco: Never   Tobacco comments:    6 ciggs daily- 11/10/21 LMR  Vaping Use   Vaping status: Never Used  Substance and Sexual Activity   Alcohol use: Yes    Comment: 1-2 daily   Drug use: No   Sexual activity: Yes    Birth control/protection: Surgical  Other Topics Concern   Not on file  Social History Narrative  Not on file   Social Drivers of Health   Financial Resource Strain: Low Risk  (02/01/2023)   Overall Financial Resource Strain (CARDIA)    Difficulty of Paying Living Expenses: Not very hard  Food Insecurity: No Food Insecurity (02/01/2023)   Hunger Vital Sign    Worried About Running Out of Food in the Last Year: Never true    Ran Out of Food in the Last Year: Never true  Transportation Needs: No Transportation Needs (02/01/2023)   PRAPARE - Administrator, Civil Service (Medical): No    Lack of Transportation (Non-Medical): No  Physical Activity: Insufficiently  Active (02/01/2023)   Exercise Vital Sign    Days of Exercise per Week: 2 days    Minutes of Exercise per Session: 20 min  Stress: No Stress Concern Present (02/01/2023)   [employer] of Occupational Health - Occupational Stress Questionnaire    Feeling of Stress : Only a little  Social Connections: Moderately Isolated (02/01/2023)   Social Connection and Isolation Panel [NHANES]    Frequency of Communication with Friends and Family: More than three times a week    Frequency of Social Gatherings with Friends and Family: Twice a week    Attends Religious Services: 1 to 4 times per year    Active Member of [employer] or Organizations: No    Attends Engineer, structural: Not on file    Marital Status: Divorced  Intimate Partner Violence: Not At Risk (06/28/2018)   Humiliation, Afraid, Rape, and Kick questionnaire    Fear of Current or Ex-Partner: No    Emotionally Abused: No    Physically Abused: No    Sexually Abused: No    Past Surgical History:  Procedure Laterality Date   ABDOMINAL HYSTERECTOMY     with bladder tact   BLADDER TACK     CHOLECYSTECTOMY N/A 07/26/2017   Procedure: LAPAROSCOPIC CHOLECYSTECTOMY;  Surgeon: Adalberto Acton, MD;  Location: [facility];  Service: General;  Laterality: N/A;    Family History  Problem Relation Age of Onset   Diabetes Father    Colon cancer Paternal Uncle        x 2   Heart disease Maternal Grandfather    Heart disease Paternal Grandfather     Allergies  Allergen Reactions   Sulfa Antibiotics Hives and Rash    Current Outpatient Medications on File Prior to Visit  Medication Sig Dispense Refill   albuterol  (VENTOLIN  HFA) 108 (90 Base) MCG/ACT inhaler INHALE 1-2 PUFFS BY MOUTH EVERY 6 HOURS AS NEEDED FOR WHEEZE OR SHORTNESS OF BREATH 6.7 each 2   ALPRAZolam  (XANAX ) 0.5 MG tablet Take 0.25 mg by mouth daily as needed for anxiety.      aspirin  EC 81 MG tablet Take 1 tablet (81 mg total) by mouth daily. Swallow whole. 90 tablet 3    budesonide -formoterol  (SYMBICORT ) 160-4.5 MCG/ACT inhaler Inhale 2 puffs into the lungs every 12 (twelve) hours. MUST KEEP APPOINTMENT 10.2 each 3   Cholecalciferol (VITAMIN D3) 125 MCG (5000 UT) CAPS Take 1 capsule by mouth daily.     citalopram  (CELEXA ) 20 MG tablet Take 1 tablet (20 mg total) by mouth daily. 30 tablet 0   fluticasone  (FLONASE ) 50 MCG/ACT nasal spray Place 2 sprays into both nostrils daily. 16 g 1   hydrochlorothiazide  (HYDRODIURIL ) 12.5 MG tablet Take 1 tablet (12.5 mg total) by mouth daily. 30 tablet 0   [START ON 12/02/2023] HYDROcodone -acetaminophen  (NORCO) 10-325 MG tablet Take 1 tablet by  mouth every 4 (four) hours as needed. 180 tablet 0   HYDROcodone -acetaminophen  (NORCO) 10-325 MG tablet Take 1 tablet by mouth every 4 (four) hours as needed. 180 tablet 0   levocetirizine (XYZAL ) 5 MG tablet TAKE 1 TABLET BY MOUTH EVERYDAY AT BEDTIME 30 tablet 11   meloxicam  (MOBIC ) 15 MG tablet Take 1 tablet (15 mg total) by mouth daily. 30 tablet 0   pantoprazole  (PROTONIX ) 40 MG tablet Take 1 tablet (40 mg total) by mouth daily. 90 tablet 3   rosuvastatin  (CRESTOR ) 10 MG tablet Take 1 tablet (10 mg total) by mouth daily. 90 tablet 3   Tiotropium Bromide  Monohydrate (SPIRIVA  RESPIMAT) 2.5 MCG/ACT AERS Inhale 2 puffs into the lungs daily. 4 g 5   vitamin B-12 (CYANOCOBALAMIN ) 100 MCG tablet Take 100 mcg by mouth daily.     vitamin E  (VITAMIN E ) 400 UNIT capsule Take 2 capsules (800 Units total) by mouth daily. 60 capsule 2   No current facility-administered medications on file prior to visit.    BP 128/60   Pulse 80   Temp 98.1 F (36.7 C)   Resp 18   Ht 5' 4 (1.626 m)   Wt 139 lb 12.8 oz (63.4 kg)   SpO2 95%   BMI 24.00 kg/m        Objective:   Physical Exam  General Mental Status- Alert. General Appearance- Not in acute distress.   Skin General: Color- Normal Color. Moisture- Normal Moisture.  Neck Carotid Arteries- Normal color. Moisture- Normal Moisture. No  carotid bruits. No JVD.  Chest and Lung Exam Auscultation: Breath Sounds: even and unlabored. Mild expiratory wheeze bilaterally.  Cardiovascular Auscultation:Rythm- RRR Murmurs & Other Heart Sounds:Auscultation of the heart reveals- No Murmurs.  Abdomen Inspection:-Inspeection Normal. Palpation/Percussion:Note:No mass. Palpation and Percussion of the abdomen reveal- Non Tender, Non Distended + BS, no rebound or guarding.   Neurologic Cranial Nerve exam:- CN III-XII intact(No nystagmus), symmetric smile. Strength:- 5/5 equal and symmetric strength both upper and lower extremities.   Heent- mild maxillary sinus pressure and congestion    Assessment & Plan:   Patient Instructions  Recurrent Bronchitis with COPD Exacerbation(concern for sinus infection as well) Recurrent bronchitis with COPD exacerbation, indicated by increased albuterol  use and symptoms of mild dyspnea, yellow sputum, and wheezing. Smoking history and CT findings of emphysema noted. Medrol  prescribed with caution for hyperglycemia risk. - Prescribe cefdinir 300 mg, one tablet twice daily for 10 days. - Prescribe benzonatate for cough relief. - Recommend continued use of Flonase  nasal spray. - Prescribe Medrol  dose pack (21 tablets) for a 6-day taper. - Order chest x-ray to rule out pneumonia. - Order complete blood count to assess leukocytes.  Coronary Artery Disease -On Crestor  for cholesterol management.  Prediabetes Prediabetic with previous blood glucose average of 120 mg/dL. Advised low sugar diet during Medrol  treatment to mitigate hyperglycemia risk. - Advise low sugar diet, especially during Medrol  treatment.  General Health Maintenance Long-term smoker contributing to respiratory issues. Smoking cessation advised to improve health and reduce complications. - Advise smoking cessation.  Follow-up Follow-up necessary to ensure symptom resolution and monitor for complications. - Schedule follow-up  appointment if symptoms persist or worsen by next Wednesday or Thursday. - Advise earlier follow-up if symptoms worsen.   Laurina Fischl, PA-C

## 2023-11-13 NOTE — Patient Instructions (Signed)
 Recurrent Bronchitis with COPD Exacerbation(concern for sinus infection as well) Recurrent bronchitis with COPD exacerbation, indicated by increased albuterol  use and symptoms of mild dyspnea, yellow sputum, and wheezing. Smoking history and CT findings of emphysema noted. Medrol  prescribed with caution for hyperglycemia risk. - Prescribe cefdinir 300 mg, one tablet twice daily for 10 days. - Prescribe benzonatate for cough relief. - Recommend continued use of Flonase  nasal spray. - Prescribe Medrol  dose pack (21 tablets) for a 6-day taper. - Order chest x-ray to rule out pneumonia. - Order complete blood count to assess leukocytes.  Coronary Artery Disease -On Crestor  for cholesterol management.  Prediabetes Prediabetic with previous blood glucose average of 120 mg/dL. Advised low sugar diet during Medrol  treatment to mitigate hyperglycemia risk. - Advise low sugar diet, especially during Medrol  treatment.  General Health Maintenance Long-term smoker contributing to respiratory issues. Smoking cessation advised to improve health and reduce complications. - Advise smoking cessation.  Follow-up Follow-up necessary to ensure symptom resolution and monitor for complications. - Schedule follow-up appointment if symptoms persist or worsen by next Wednesday or Thursday. - Advise earlier follow-up if symptoms worsen.

## 2023-11-14 ENCOUNTER — Ambulatory Visit: Payer: Self-pay | Admitting: Medical

## 2023-11-25 ENCOUNTER — Other Ambulatory Visit: Payer: Self-pay | Admitting: Medical

## 2023-12-04 ENCOUNTER — Other Ambulatory Visit: Payer: Self-pay | Admitting: Physician Assistant

## 2023-12-04 ENCOUNTER — Other Ambulatory Visit (HOSPITAL_COMMUNITY): Payer: Self-pay

## 2023-12-04 DIAGNOSIS — H66001 Acute suppurative otitis media without spontaneous rupture of ear drum, right ear: Secondary | ICD-10-CM

## 2023-12-05 ENCOUNTER — Other Ambulatory Visit (HOSPITAL_COMMUNITY): Payer: Self-pay

## 2023-12-07 ENCOUNTER — Other Ambulatory Visit: Payer: Self-pay | Admitting: Medical

## 2023-12-15 ENCOUNTER — Other Ambulatory Visit: Payer: Self-pay | Admitting: Medical

## 2023-12-16 ENCOUNTER — Telehealth: Payer: Self-pay

## 2023-12-16 ENCOUNTER — Other Ambulatory Visit (HOSPITAL_COMMUNITY): Payer: Self-pay

## 2023-12-16 NOTE — Telephone Encounter (Signed)
 Pharmacy Patient Advocate Encounter   Received notification from RX Request Messages that prior authorization for Levocetirizine 5mg  tabs is required/requested.   Insurance verification completed.   The patient is insured through [facility] .   Per test claim: Refills are not covered.   Placed a call to the insurance, per the rep, the patient will need to use mail order or receive a 90 day supply from [facility], [facility] or [facility].   Per [facility], patient will need a new script.

## 2023-12-30 ENCOUNTER — Other Ambulatory Visit: Payer: Self-pay | Admitting: Medical

## 2023-12-30 ENCOUNTER — Other Ambulatory Visit: Payer: Self-pay | Admitting: Internal Medicine

## 2023-12-31 NOTE — Telephone Encounter (Unsigned)
 Copied from CRM [id number]. Topic: Clinical - Medication Refill >> Dec 31, 2023 11:37 AM Harlene ORN wrote: Medication: citalopram  (CELEXA ) 20 MG tablet, hydrochlorothiazide  (HYDRODIURIL ) 12.5 MG tablet, levocetirizine (XYZAL ) 5 MG tablet, rosuvastatin  (CRESTOR ) 10 MG tablet, fenofibrate  (TRICOR ) 48 MG tablet  Has the patient contacted their pharmacy? Yes (Agent: If no, request that the patient contact the pharmacy for the refill. If patient does not wish to contact the pharmacy document the reason why and proceed with request.) (Agent: If yes, when and what did the pharmacy advise?)  This is the patient's preferred pharmacy:  [id number] [place], [place] - [address] AT [facility] [address] [place] [address] Phone: [phone] Fax: [phone]  Is this the correct pharmacy for this prescription? Yes If no, delete pharmacy and type the correct one.   Has the prescription been filled recently? No  Is the patient out of the medication? Yes  Has the patient been seen for an appointment in the last year OR does the patient have an upcoming appointment? Yes  Can we respond through MyChart? Yes  Agent: Please be advised that Rx refills may take up to 3 business days. We ask that you follow-up with your pharmacy.

## 2024-01-02 ENCOUNTER — Other Ambulatory Visit (HOSPITAL_COMMUNITY): Payer: Self-pay

## 2024-01-02 ENCOUNTER — Other Ambulatory Visit: Payer: Self-pay

## 2024-01-02 MED ORDER — HYDROCODONE-ACETAMINOPHEN 10-325 MG PO TABS
1.0000 | ORAL_TABLET | ORAL | 0 refills | Status: DC
  Filled 2024-01-02: qty 180, 30d supply, fill #0

## 2024-01-02 MED ORDER — HYDROCODONE-ACETAMINOPHEN 10-325 MG PO TABS
1.0000 | ORAL_TABLET | ORAL | 0 refills | Status: DC
  Filled 2024-02-04: qty 180, 30d supply, fill #0

## 2024-01-04 ENCOUNTER — Other Ambulatory Visit (HOSPITAL_COMMUNITY): Payer: Self-pay

## 2024-01-06 ENCOUNTER — Encounter: Payer: Self-pay | Admitting: Medical

## 2024-01-06 ENCOUNTER — Other Ambulatory Visit: Payer: Self-pay | Admitting: Medical

## 2024-01-06 DIAGNOSIS — H66001 Acute suppurative otitis media without spontaneous rupture of ear drum, right ear: Secondary | ICD-10-CM

## 2024-01-06 MED ORDER — ROSUVASTATIN CALCIUM 10 MG PO TABS: 10.0000 mg | ORAL_TABLET | Freq: Every day | ORAL | 3 refills | Status: AC

## 2024-01-06 MED ORDER — CEFDINIR 300 MG PO CAPS: 300.0000 mg | ORAL_CAPSULE | Freq: Two times a day (BID) | ORAL | 0 refills | Status: DC

## 2024-01-06 MED ORDER — CITALOPRAM HYDROBROMIDE 20 MG PO TABS: 20.0000 mg | ORAL_TABLET | Freq: Every day | ORAL | 0 refills | Status: DC

## 2024-01-06 MED ORDER — HYDROCHLOROTHIAZIDE 12.5 MG PO TABS: 12.5000 mg | ORAL_TABLET | Freq: Every day | ORAL | 0 refills | Status: DC

## 2024-01-06 MED ORDER — FLUTICASONE PROPIONATE 50 MCG/ACT NA SUSP: 2.0000 | Freq: Every day | NASAL | 5 refills | Status: AC

## 2024-01-08 ENCOUNTER — Other Ambulatory Visit: Payer: Self-pay

## 2024-01-08 MED ORDER — LEVOCETIRIZINE DIHYDROCHLORIDE 5 MG PO TABS: 5.0000 mg | ORAL_TABLET | Freq: Every evening | ORAL | 11 refills | Status: AC

## 2024-01-08 MED ORDER — FENOFIBRATE 48 MG PO TABS: 48.0000 mg | ORAL_TABLET | Freq: Every day | ORAL | 3 refills | Status: AC

## 2024-01-08 NOTE — Addendum Note (Signed)
 Addended by: DORINA DALLAS HERO on: 01/08/2024 07:56 PM   Modules accepted: Orders

## 2024-01-23 ENCOUNTER — Ambulatory Visit: Payer: Self-pay

## 2024-01-23 NOTE — Telephone Encounter (Signed)
 FYI Only or Action Required?: FYI only for provider.  Patient was last seen in primary care on 11/13/2023 by Dorina Loving, PA-C.  Called Nurse Triage reporting Foot Injury.  Symptoms began 2 weeks ago.  Symptoms are: gradually improving.  Triage Disposition: See PCP When Office is Open (Within 3 Days)  Patient/caregiver understands and will follow disposition?: Yes        Copied from CRM #[id number]. Topic: Clinical - Red Word Triage >> Jan 23, 2024  9:55 AM Franky GRADE wrote: Red Word that prompted transfer to Nurse Triage: Patient is experiencing bruising and swelling on her right foot and ankle for the last couple of weeks. Bruising has gotten better but swelling remains.         Reason for Disposition  [1] After 2 weeks AND [2] still painful or swollen  Answer Assessment - Initial Assessment Questions 1. MECHANISM: How did the injury happen? (e.g., twisting injury, direct blow)      Foot was stepped on by her grandson  2. ONSET: When did the injury happen? (e.g., minutes or hours ago)      2 weeks  3. LOCATION: Where is the injury located?      Right foot 4. APPEARANCE of INJURY: What does the injury look like?      Bruising and swelling  5. WEIGHT-BEARING: Can you put weight on that foot? Can you walk (four steps or more)?       Able to bear weight  6. SIZE: For cuts, bruises, or swelling, ask: How large is it? (e.g., inches or centimeters;  entire joint)      No swelling now, but swells throughout the day  7. PAIN: Is there pain? If Yes, ask: How bad is the pain? What does it keep you from doing? (Scale 0-10; or none, mild, moderate, severe)     No pain now  8. TETANUS: For any breaks in the skin, ask: When was your last tetanus booster?     N/A 9. OTHER SYMPTOMS: Do you have any other symptoms?      No  Protocols used: Foot Injury-A-AH

## 2024-01-24 ENCOUNTER — Ambulatory Visit: Admitting: Medical

## 2024-01-24 ENCOUNTER — Encounter: Payer: Self-pay | Admitting: Medical

## 2024-01-24 ENCOUNTER — Ambulatory Visit (HOSPITAL_BASED_OUTPATIENT_CLINIC_OR_DEPARTMENT_OTHER)
Admission: RE | Admit: 2024-01-24 | Discharge: 2024-01-24 | Disposition: A | Discharge status: Home / Self Care | Source: Ambulatory Visit | Attending: Medical | Admitting: Medical

## 2024-01-24 ENCOUNTER — Ambulatory Visit: Payer: Self-pay | Admitting: Medical

## 2024-01-24 VITALS — BP 140/70 | HR 84 | Temp 98.2°F | Resp 15 | Ht 64.0 in | Wt 140.6 lb

## 2024-01-24 DIAGNOSIS — M79671 Pain in right foot: Secondary | ICD-10-CM

## 2024-01-24 DIAGNOSIS — M25571 Pain in right ankle and joints of right foot: Secondary | ICD-10-CM | POA: Diagnosis not present

## 2024-01-24 DIAGNOSIS — I10 Essential (primary) hypertension: Secondary | ICD-10-CM

## 2024-01-24 NOTE — Progress Notes (Signed)
   Subjective:    Patient ID: Valerie Key, female    DOB: 16-Aug-1960, 63 y.o.   MRN: [id number]  HPI Valerie Key is a 63 year old female who presents with foot pain and swelling after trauma.  Three weeks ago, her two-year-old grandson, weighing approximately forty pounds, accidentally stepped on her right foot twice, causing immediate pain. The toes, except for the big toe, turned black and blue and began to swell. The swelling extended to the ankle, which also became tender, particularly in the fibular area. Over last 3 weeks swelling and bruising subsided but pain is still present  She has been using compression socks for four to five days, which helped reduce the swelling in the ankle but did not alleviate the pain. The foot remains more tender than the ankle, and she experiences low-level pain when walking.  She has a history of arthritis. She does not believe the foot is broken as she can still walk on it.  Her current medications include hydrochlorothiazide  12.5 mg for blood pressure and hydrocodone  for chronic back pain, which she also uses for arthritis pain. No history of stomach ulcers. Reports good kidney function.   Review of Systems See hpi    Objective:   Physical Exam General Mental Status- Alert. General Appearance- Not in acute distress.   Skin General: Color- Normal Color. Moisture- Normal Moisture.  Neck Carotid Arteries- Normal color. Moisture- Normal Moisture. No carotid bruits. No JVD.  Chest and Lung Exam Auscultation: Breath Sounds:-Normal.  Cardiovascular Auscultation:Rythm- Regular. Murmurs & Other Heart Sounds:Auscultation of the heart reveals- No Murmurs.  Abdomen Inspection:-Inspeection Normal. Palpation/Percussion:Note:No mass. Palpation and Percussion of the abdomen reveal- Non Tender, Non Distended + BS, no rebound or guarding.    Neurologic Cranial Nerve exam:- CN III-XII intact(No nystagmus), symmetric  smile. Strength:- 5/5 equal and symmetric strength both upper and lower extremities.   Rt foot- not swollen. No bruising. Distal metatarsal tenderness to palpation. Rt ankle- not swollen. Talofibular area mild tender to palpation but good rom.       Assessment & Plan:   Patient Instructions  Right foot and ankle contusion with persistent pain and swelling Contusion with persistent pain and swelling for three weeks post-trauma. Differential includes contusion and possible hairline fracture. X-ray ordered to rule out fracture. - Order right foot and ankle X-ray to assess for fracture. - Advise use of compression socks to manage swelling.  Hypertension Hypertension managed with hydrochlorothiazide . Blood pressure variable, recent reading 140/70 mmHg. Home monitoring recommended. Consider losartan if uncontrolled. - Continue hydrochlorothiazide  12.5 mg daily. - Instruct to monitor blood pressure daily at home. - Advise to report home blood pressure readings in one week via MyChart. - Consider adding losartan 25 mg if blood pressure remains elevated.  Chronic back pain Chronic back pain managed with Norco. No additional acetaminophen  recommended. - Continue Norco as prescribed by pain management.   Follow up 3-4 weeks wellness exam but bp update on daily bp readings over next week   Dmoni Fortson, PA-C

## 2024-01-24 NOTE — Patient Instructions (Addendum)
 Right foot and ankle contusion with persistent pain and swelling Contusion with persistent pain and swelling for three weeks post-trauma. Differential includes contusion and possible hairline fracture. X-ray ordered to rule out fracture. - Order right foot and ankle X-ray to assess for fracture. - Advise use of compression socks to manage swelling.  Hypertension Hypertension managed with hydrochlorothiazide . Blood pressure variable, recent reading 140/70 mmHg. Home monitoring recommended. Consider losartan if uncontrolled. - Continue hydrochlorothiazide  12.5 mg daily. - Instruct to monitor blood pressure daily at home. - Advise to report home blood pressure readings in one week via MyChart. - Consider adding losartan 25 mg if blood pressure remains elevated.  Chronic back pain Chronic back pain managed with Norco. No additional acetaminophen  recommended. - Continue Norco as prescribed by pain management.   Follow up 3-4 weeks wellness exam but bp update on daily bp readings over next week

## 2024-02-04 ENCOUNTER — Other Ambulatory Visit: Payer: Self-pay

## 2024-02-04 ENCOUNTER — Other Ambulatory Visit (HOSPITAL_COMMUNITY): Payer: Self-pay

## 2024-02-14 ENCOUNTER — Ambulatory Visit: Admitting: Medical

## 2024-02-14 ENCOUNTER — Other Ambulatory Visit (HOSPITAL_COMMUNITY): Payer: Self-pay

## 2024-02-19 ENCOUNTER — Other Ambulatory Visit (HOSPITAL_COMMUNITY): Payer: Self-pay

## 2024-02-20 ENCOUNTER — Telehealth: Payer: Self-pay | Admitting: Internal Medicine

## 2024-02-20 NOTE — Telephone Encounter (Signed)
 Inbound call from patient stating that she is having left sided pain under the rib cage. Patient is requesting a call back to discuss symptoms. Please advise.

## 2024-02-20 NOTE — Telephone Encounter (Signed)
 Pt reports on Sunday she started having sharp pain on her left side right under her ribcage/breast area. She thought it may have been gas or constipation so she started taking gasx and colace. This helped with her constipation but the pain has continued. Pt scheduled to see Deanna May NP tomorrow at 3:20pm.

## 2024-02-21 ENCOUNTER — Encounter: Payer: Self-pay | Admitting: Gastroenterology

## 2024-02-21 ENCOUNTER — Other Ambulatory Visit (HOSPITAL_COMMUNITY): Payer: Self-pay

## 2024-02-21 ENCOUNTER — Ambulatory Visit: Admitting: Medical

## 2024-02-21 ENCOUNTER — Ambulatory Visit (HOSPITAL_COMMUNITY)
Admission: RE | Admit: 2024-02-21 | Discharge: 2024-02-21 | Disposition: A | Discharge status: Home / Self Care | Source: Ambulatory Visit | Attending: Gastroenterology | Admitting: Gastroenterology

## 2024-02-21 ENCOUNTER — Other Ambulatory Visit (INDEPENDENT_AMBULATORY_CARE_PROVIDER_SITE_OTHER)

## 2024-02-21 ENCOUNTER — Ambulatory Visit (INDEPENDENT_AMBULATORY_CARE_PROVIDER_SITE_OTHER): Admitting: Gastroenterology

## 2024-02-21 VITALS — BP 146/80 | HR 106 | Ht 64.0 in | Wt 135.1 lb

## 2024-02-21 DIAGNOSIS — R194 Change in bowel habit: Secondary | ICD-10-CM

## 2024-02-21 DIAGNOSIS — R1012 Left upper quadrant pain: Secondary | ICD-10-CM | POA: Insufficient documentation

## 2024-02-21 LAB — CBC WITH DIFFERENTIAL/PLATELET
Basophils Absolute: 0.1 K/uL (ref 0.0–0.1)
Basophils Relative: 1.2 % (ref 0.0–3.0)
Eosinophils Absolute: 0.3 K/uL (ref 0.0–0.7)
Eosinophils Relative: 2.6 % (ref 0.0–5.0)
HCT: 43.3 % (ref 36.0–46.0)
Hemoglobin: 14.3 g/dL (ref 12.0–15.0)
Lymphocytes Relative: 30.6 % (ref 12.0–46.0)
Lymphs Abs: 3.3 K/uL (ref 0.7–4.0)
MCHC: 33.1 g/dL (ref 30.0–36.0)
MCV: 90.1 fl (ref 78.0–100.0)
Monocytes Absolute: 0.8 K/uL (ref 0.1–1.0)
Monocytes Relative: 7.9 % (ref 3.0–12.0)
Neutro Abs: 6.2 K/uL (ref 1.4–7.7)
Neutrophils Relative %: 57.7 % (ref 43.0–77.0)
Platelets: 288 K/uL (ref 150.0–400.0)
RBC: 4.8 Mil/uL (ref 3.87–5.11)
RDW: 14.4 % (ref 11.5–15.5)
WBC: 10.8 K/uL — ABNORMAL HIGH (ref 4.0–10.5)

## 2024-02-21 LAB — COMPREHENSIVE METABOLIC PANEL WITH GFR
ALT: 11 U/L (ref 0–35)
AST: 15 U/L (ref 0–37)
Albumin: 4.4 g/dL (ref 3.5–5.2)
Alkaline Phosphatase: 68 U/L (ref 39–117)
BUN: 10 mg/dL (ref 6–23)
CO2: 35 meq/L — ABNORMAL HIGH (ref 19–32)
Calcium: 9.9 mg/dL (ref 8.4–10.5)
Chloride: 98 meq/L (ref 96–112)
Creatinine, Ser: 0.82 mg/dL (ref 0.40–1.20)
GFR: 77.3 mL/min (ref >60.00)
Glucose, Bld: 97 mg/dL (ref 70–99)
Potassium: 3.4 meq/L — ABNORMAL LOW (ref 3.5–5.1)
Sodium: 141 meq/L (ref 135–145)
Total Bilirubin: 0.5 mg/dL (ref 0.2–1.2)
Total Protein: 7.6 g/dL (ref 6.0–8.3)

## 2024-02-21 LAB — LIPASE: Lipase: 22 U/L (ref 11.0–59.0)

## 2024-02-21 MED ORDER — IOHEXOL 300 MG/ML  SOLN
100.0000 mL | Freq: Once | INTRAMUSCULAR | Status: AC | PRN
  Administered 2024-02-21: 100 mL via INTRAVENOUS

## 2024-02-21 NOTE — Patient Instructions (Addendum)
  Recommend full liquid diet A full liquid diet is made up only of fluids and foods that are normally liquid and foods that turn to liquid when they are at room temperature. It also includes:  Strained creamy soups Tea Juice Jell-O Popsicles  --Advised to go to the ER if there is any severe abdominal pain, unable to hold down food/water, blood in stool or vomit, chest pain, shortness of breath, or any worsening symptoms.  Your provider has requested that you go to the basement level for lab work before leaving today. Press B on the elevator. The lab is located at the first door on the left as you exit the elevator.     You have been scheduled for a CT scan of the abdomen and pelvis at [facility], 1st floor Radiology. You are scheduled on 02/21/24 at 5:00pm. You should arrive 15 minutes prior to your appointment time for registration.    You may take any medications as prescribed with a small amount of water, if necessary. If you take any of the following medications: METFORMIN, GLUCOPHAGE, GLUCOVANCE, AVANDAMET, RIOMET, FORTAMET, ACTOPLUS MET, JANUMET, GLUMETZA or METAGLIP, you MAY be asked to HOLD this medication 48 hours AFTER the exam.   The purpose of you drinking the oral contrast is to aid in the visualization of your intestinal tract. The contrast solution may cause some diarrhea. Depending on your individual set of symptoms, you may also receive an intravenous injection of x-ray contrast/dye. Plan on being at [facility] for 45 minutes or longer, depending on the type of exam you are having performed.   If you have any questions regarding your exam or if you need to reschedule, you may call [facility] at [phone] between the hours of 8:00 am and 5:00 pm, Monday-Friday.

## 2024-02-21 NOTE — Progress Notes (Signed)
 Chief Complaint:LUQ pain Primary GI Doctor:Dr. Albertus  HPI:  Patient is a  63  year old female patient with past medical history of GERD, COPD, asthma, hypertension, anxiety and B12 deficiency, who presents  for a evaluation of LUQ pain .    Patient was last seen in the GI office on 06/10/2023 by Amy, PA for routine follow-up and medication refill  Interval History   Patient presents for evaluation LUQ pain. She thought it Cleo Santucci be gas so she took OTC Gas X which didn't help. She was having constipation as well so so she took OTC ducolax and went to restroom, but pain continued. She decided to a sandwich yesterday and the pain increased to a 10/10 on pain scale. She took a hydrocodone  which reduced the pain to 6-7/10.   She had concerns it Loreena Valeri be diverticulitis and reports she ate popcorn last week and knew she wasn't suppose to. No fever or chills. No blood in stools.   Wt Readings from Last 3 Encounters:  02/21/24 135 lb 2 oz (61.3 kg)  01/24/24 140 lb 9.6 oz (63.8 kg)  11/13/23 139 lb 12.8 oz (63.4 kg)     Past Medical History:  Diagnosis Date   Allergy    SEASONAL   Anxiety    Arthritis    Asthma    Back pain    Chronic pain syndrome    COPD (chronic obstructive pulmonary disease) (HCC)    Depression    Diverticulosis    Fatty liver    Gastritis    GERD (gastroesophageal reflux disease)    Hiatal hernia    History of kidney stones    Hyperlipidemia     Past Surgical History:  Procedure Laterality Date   ABDOMINAL HYSTERECTOMY     with bladder tact   BLADDER TACK     CHOLECYSTECTOMY N/A 07/26/2017   Procedure: LAPAROSCOPIC CHOLECYSTECTOMY;  Surgeon: Signe Mitzie LABOR, MD;  Location: [facility] ORS;  Service: General;  Laterality: N/A;    Current Outpatient Medications  Medication Sig Dispense Refill   albuterol  (VENTOLIN  HFA) 108 (90 Base) MCG/ACT inhaler INHALE 1-2 PUFFS BY MOUTH EVERY 6 HOURS AS NEEDED FOR WHEEZE OR SHORTNESS OF BREATH 6.7 each 2   ALPRAZolam  (XANAX )  0.5 MG tablet Take 0.25 mg by mouth daily as needed for anxiety.      aspirin  EC 81 MG tablet Take 1 tablet (81 mg total) by mouth daily. Swallow whole. 90 tablet 3   benzonatate  (TESSALON ) 100 MG capsule Take 1 capsule (100 mg total) by mouth 3 (three) times daily as needed for cough. 30 capsule 0   budesonide -formoterol  (SYMBICORT ) 160-4.5 MCG/ACT inhaler Inhale 2 puffs into the lungs every 12 (twelve) hours. MUST KEEP APPOINTMENT 10.2 each 3   cefdinir  (OMNICEF ) 300 MG capsule Take 1 capsule (300 mg total) by mouth 2 (two) times daily. 90 capsule 0   Cholecalciferol (VITAMIN D3) 125 MCG (5000 UT) CAPS Take 1 capsule by mouth daily.     citalopram  (CELEXA ) 20 MG tablet Take 1 tablet (20 mg total) by mouth daily. 90 tablet 0   fenofibrate  (TRICOR ) 48 MG tablet Take 1 tablet (48 mg total) by mouth daily. 90 tablet 3   fluconazole  (DIFLUCAN ) 150 MG tablet Take 150 mg by mouth once.     fluticasone  (FLONASE ) 50 MCG/ACT nasal spray Place 2 sprays into both nostrils daily. 16 mL 5   hydrochlorothiazide  (HYDRODIURIL ) 12.5 MG tablet Take 1 tablet (12.5 mg total) by mouth daily. 90  tablet 0   HYDROcodone -acetaminophen  (NORCO) 10-325 MG tablet Take 1 tablet by mouth every 4 hours as needed 01/31/24 180 tablet 0   levocetirizine (XYZAL ) 5 MG tablet Take 1 tablet (5 mg total) by mouth every evening. 90 tablet 11   meloxicam  (MOBIC ) 15 MG tablet Take 1 tablet (15 mg total) by mouth daily. 30 tablet 0   methylPREDNISolone  (MEDROL ) 4 MG tablet Standard 6 day taper dose 21 tablet 0   pantoprazole  (PROTONIX ) 40 MG tablet Take 1 tablet (40 mg total) by mouth daily. 90 tablet 3   rosuvastatin  (CRESTOR ) 10 MG tablet Take 1 tablet (10 mg total) by mouth daily. 90 tablet 3   Tiotropium Bromide  Monohydrate (SPIRIVA  RESPIMAT) 2.5 MCG/ACT AERS Inhale 2 puffs into the lungs daily. 4 g 5   vitamin B-12 (CYANOCOBALAMIN ) 100 MCG tablet Take 100 mcg by mouth daily.     vitamin E  (VITAMIN E ) 400 UNIT capsule Take 2 capsules  (800 Units total) by mouth daily. 60 capsule 2   No current facility-administered medications for this visit.    Allergies as of 02/21/2024 - Review Complete 02/21/2024  Allergen Reaction Noted   Sulfa antibiotics Hives and Rash 02/12/2012    Family History  Problem Relation Age of Onset   Diabetes Father    Colon cancer Paternal Uncle        x 2   Heart disease Maternal Grandfather    Heart disease Paternal Grandfather     Review of Systems:    Constitutional: No weight loss, fever, chills, weakness or fatigue HEENT: Eyes: No change in vision               Ears, Nose, Throat:  No change in hearing or congestion Skin: No rash or itching Cardiovascular: No chest pain, chest pressure or palpitations   Respiratory: No SOB or cough Gastrointestinal: See HPI and otherwise negative Genitourinary: No dysuria or change in urinary frequency Neurological: No headache, dizziness or syncope Musculoskeletal: No new muscle or joint pain Hematologic: No bleeding or bruising Psychiatric: No history of depression or anxiety    Physical Exam:  Vital signs: BP (!) 146/80   Pulse (!) 106   Ht 5' 4 (1.626 m)   Wt 135 lb 2 oz (61.3 kg)   BMI 23.19 kg/m   Constitutional:   Pleasant  female appears to be in NAD, Well developed, Well nourished, alert and cooperative Throat: Oral cavity and pharynx without inflammation, swelling or lesion.  Respiratory: Respirations even and unlabored. Lungs clear to auscultation bilaterally.   No wheezes, crackles, or rhonchi.  Cardiovascular: Normal S1, S2. Regular rate and rhythm. No peripheral edema, cyanosis or pallor.  Gastrointestinal:  Soft, nondistended, LUQ abdominal tenderness. No rebound or guarding. Normal bowel sounds. No appreciable masses or hepatomegaly. Rectal:  Not performed.  Msk:  Symmetrical without gross deformities. Without edema, no deformity or joint abnormality.  Neurologic:  Alert and  oriented x4;  grossly normal neurologically.   Skin:   Dry and intact without significant lesions or rashes.  RELEVANT LABS AND IMAGING: CBC    Latest Ref Rng & Units 11/13/2023    9:24 AM 02/05/2023    8:47 AM 12/29/2021    8:56 AM  CBC  WBC 3.8 - 10.8 Thousand/uL 10.9  8.5  10.8   Hemoglobin 11.7 - 15.5 g/dL 87.2  86.4  86.6   Hematocrit 35.0 - 45.0 % 40.8  41.5  40.7   Platelets 140 - 400 Thousand/uL 289  270.0  267.0      CMP     Latest Ref Rng & Units 02/05/2023    8:47 AM 12/29/2021    8:56 AM 08/21/2021    9:04 AM  CMP  Glucose 70 - 99 mg/dL 84  91  83   BUN 6 - 23 mg/dL 12  10  10    Creatinine 0.40 - 1.20 mg/dL 9.19  9.16  9.14   Sodium 135 - 145 mEq/L 143  144  143   Potassium 3.5 - 5.1 mEq/L 3.9  3.8  3.9   Chloride 96 - 112 mEq/L 99  100  100   CO2 19 - 32 mEq/L 35  35  35   Calcium  8.4 - 10.5 mg/dL 9.5  9.6  9.6   Total Protein 6.0 - 8.3 g/dL 6.7  7.0  6.6   Total Bilirubin 0.2 - 1.2 mg/dL 0.4  0.4  0.4   Alkaline Phos 39 - 117 U/L 65  64  58   AST 0 - 37 U/L 14  17  20    ALT 0 - 35 U/L 12  15  16       Lab Results  Component Value Date   TSH 2.34 12/29/2021  Last colonoscopy was done in 2019 with finding of sigmoid diverticulosis and 1 inflammatory rectal polyp and is indicated for 10-year interval follow-up.   EGD in November 2019 with normal-appearing esophagus and 2 cm hiatal hernia otherwise negative exam.   Ultrasound June 2023, status postcholecystectomy, CBD 7.9 mm, and noted diffuse increased echotexture throughout the liver no focal mass, portal vein patent.   Most recent LFTs September 2024 were normal and CBC at that time also normal.   Assessment: Encounter Diagnoses  Name Primary?   LUQ pain Yes   Altered bowel habits      63 year old female patient that presents with severe left upper quadrant pain recent bout of constipation with flatulence.  Last colonoscopy was in 2019 with findings of sigmoid diverticulosis and 1 inflammatory rectal polyp with recall 10 years.  Will go ahead and order  stat CT abdomen and pelvis to rule out any acute process such as diverticulitis as well as stat labs CBC, CMP and lipase.  Advised patient follow full liquid diet and go to ER if the pain continues or increases.  Patient verbalizes understanding.   Plan: -STAT Check CBC,CMP ,lipase  -STAT CTAP -full liquid diet -Advised to go to the ER if there is any severe abdominal pain, unable to hold down food/water, blood in stool or vomit, chest pain, shortness of breath, or any worsening symptoms.    Thank you for the courtesy of this consult. Please call me with any questions or concerns.   Chala Gul, FNP-C [facility] 02/21/2024, 3:27 PM  Cc: Saguier, Edward, PA-C

## 2024-02-22 ENCOUNTER — Ambulatory Visit: Payer: Self-pay | Admitting: Medical

## 2024-02-25 ENCOUNTER — Other Ambulatory Visit (HOSPITAL_COMMUNITY): Payer: Self-pay

## 2024-02-26 ENCOUNTER — Ambulatory Visit (HOSPITAL_BASED_OUTPATIENT_CLINIC_OR_DEPARTMENT_OTHER)
Admission: RE | Admit: 2024-02-26 | Discharge: 2024-02-26 | Disposition: A | Discharge status: Home / Self Care | Source: Ambulatory Visit | Attending: Medical | Admitting: Medical

## 2024-02-26 ENCOUNTER — Ambulatory Visit (INDEPENDENT_AMBULATORY_CARE_PROVIDER_SITE_OTHER): Admitting: Medical

## 2024-02-26 VITALS — BP 150/90 | HR 79 | Temp 98.3°F | Resp 15 | Ht 64.0 in | Wt 137.6 lb

## 2024-02-26 DIAGNOSIS — R0781 Pleurodynia: Secondary | ICD-10-CM

## 2024-02-26 DIAGNOSIS — K5900 Constipation, unspecified: Secondary | ICD-10-CM

## 2024-02-26 DIAGNOSIS — E876 Hypokalemia: Secondary | ICD-10-CM

## 2024-02-26 DIAGNOSIS — M8580 Other specified disorders of bone density and structure, unspecified site: Secondary | ICD-10-CM

## 2024-02-26 DIAGNOSIS — F17200 Nicotine dependence, unspecified, uncomplicated: Secondary | ICD-10-CM | POA: Diagnosis not present

## 2024-02-26 DIAGNOSIS — Z780 Asymptomatic menopausal state: Secondary | ICD-10-CM

## 2024-02-26 DIAGNOSIS — Z13820 Encounter for screening for osteoporosis: Secondary | ICD-10-CM

## 2024-02-26 LAB — COMPREHENSIVE METABOLIC PANEL WITH GFR
ALT: 15 U/L (ref 0–35)
AST: 20 U/L (ref 0–37)
Albumin: 4.3 g/dL (ref 3.5–5.2)
Alkaline Phosphatase: 62 U/L (ref 39–117)
BUN: 6 mg/dL (ref 6–23)
CO2: 37 meq/L — ABNORMAL HIGH (ref 19–32)
Calcium: 9.9 mg/dL (ref 8.4–10.5)
Chloride: 97 meq/L (ref 96–112)
Creatinine, Ser: 0.79 mg/dL (ref 0.40–1.20)
GFR: 80.83 mL/min (ref >60.00)
Glucose, Bld: 89 mg/dL (ref 70–99)
Potassium: 4.1 meq/L (ref 3.5–5.1)
Sodium: 142 meq/L (ref 135–145)
Total Bilirubin: 0.5 mg/dL (ref 0.2–1.2)
Total Protein: 7 g/dL (ref 6.0–8.3)

## 2024-02-26 NOTE — Patient Instructions (Signed)
 1. Hypokalemia (Primary) -recheck level and decide if Rx supplementation needed - Comp Met (CMET)  2. Constipation, unspecified constipation type -xray to assess stool burden - DG Abd 2 Views; Future -continue high fiber diet, hydrate well and mild exercise. -use miralax if needed. -if pain left upper quadrant and constipation persist may need refer back to GI MD.  3. Rib pain on left side -some left lower rib area pain, cough and hx of smoking. -get cxr to assess differential as pain is in left upper quadrant and lower rib area with ct scan abd not revealing cause.  4. Smoker -recommend smoking cessation. - DG Ribs Unilateral W/Chest Left; Future   Follow up date to be determined after lab and imaging review.

## 2024-02-26 NOTE — Progress Notes (Signed)
 Subjective:    Patient ID: Valerie Key, female    DOB: 1961/01/12, 63 y.o.   MRN: [id number]  HPI   Pt reporting intermittent chronic constipation. She states improved with increase fiber in diet, overall eating less and occaional miralax. Pt had recent ct abd and pelvis for left side abdomen pain. Below is ct abd/pelvis report.    EXAM: CT ABDOMEN AND PELVIS WITH CONTRAST   TECHNIQUE: Multidetector CT imaging of the abdomen and pelvis was performed using the standard protocol following bolus administration of intravenous contrast.   RADIATION DOSE REDUCTION: This exam was performed according to the departmental dose-optimization program which includes automated exposure control, adjustment of the mA and/or kV according to patient size and/or use of iterative reconstruction technique.   CONTRAST:  OMNIPAQUE  IOHEXOL  300 MG/ML  SOLN   COMPARISON:  08/29/2017   FINDINGS: Lower chest: No acute findings   Hepatobiliary: No focal liver abnormality is seen. Status post cholecystectomy. No biliary dilatation.   Pancreas: No focal abnormality or ductal dilatation.   Spleen: No focal abnormality.  Normal size.   Adrenals/Urinary Tract: No adrenal abnormality. No focal renal abnormality. No stones or hydronephrosis. Urinary bladder is unremarkable.   Stomach/Bowel: Normal appendix. Sigmoid diverticulosis. No active diverticulitis. Stomach and small bowel decompressed, unremarkable.   Vascular/Lymphatic: Aortic atherosclerosis. No evidence of aneurysm or adenopathy.   Reproductive: Prior hysterectomy.  No adnexal masses.   Other: No free fluid or free air.   Musculoskeletal: No acute bony abnormality.   IMPRESSION: Sigmoid diverticulosis.  No active diverticulitis.   Aortic atherosclerosis.   No acute findings.   GI visit note review.below   LUQ pain Yes    Altered bowel habits       63 year old female patient that presents with severe  left upper quadrant pain recent bout of constipation with flatulence.  Last colonoscopy was in 2019 with findings of sigmoid diverticulosis and 1 inflammatory rectal polyp with recall 10 years.  Will go ahead and order stat CT abdomen and pelvis to rule out any acute process such as diverticulitis as well as stat labs CBC, CMP and lipase.  Advised patient follow full liquid diet and go to ER if the pain continues or increases.  Patient verbalizes understanding.     Plan: -STAT Check CBC,CMP ,lipase  -STAT CTAP -full liquid diet -Advised to go to the ER if there is any severe abdominal pain, unable to hold down food/water, blood in stool or vomit, chest pain, shortness of breath, or any worsening symptoms.     Thank you for the courtesy of this consult. Please call me with any questions or concerns.    On last visit with me pt had minimal low k and advised to increase potassium in diet. She is in for repeat cmp today.  Also to evaluate differential cause of left upper quadrant pain.    Review of Systems  Constitutional:  Negative for chills, fatigue and fever.  HENT:  Negative for congestion, sinus pressure and sinus pain.   Respiratory:  Positive for cough. Negative for chest tightness, shortness of breath and wheezing.        Intermittnet smoker cough per pt.  Cardiovascular:  Negative for chest pain and palpitations.  Gastrointestinal:  Positive for constipation. Negative for abdominal pain, diarrhea, nausea and rectal pain.       See hpi. Luq pain at times but not today.  Genitourinary:  Negative for dysuria.  Musculoskeletal:  Negative for back pain,  joint swelling and neck pain.  Skin:  Negative for rash.  Neurological:  Negative for facial asymmetry, speech difficulty, weakness and light-headedness.  Hematological:  Negative for adenopathy. Does not bruise/bleed easily.  Psychiatric/Behavioral:  Negative for behavioral problems and confusion. The patient is not nervous/anxious.         Objective:   Physical Exam  General- No acute distress. Pleasant patient. Neck- Full range of motion, no jvd Lungs- Clear, even and unlabored. Heart- regular rate and rhythm. Neurologic- CNII- XII grossly intact.  Abdomen- soft, nd, nt, +bs. No rebound or guarding and no organomegaly. Back- no cva tendernes. Anterior thorax- left lower rib area pain on palpation today.      Assessment & Plan:   Patient Instructions  1. Hypokalemia (Primary) -recheck level and decide if Rx supplementation needed - Comp Met (CMET)  2. Constipation, unspecified constipation type -xray to assess stool burden - DG Abd 2 Views; Future -continue high fiber diet, hydrate well and mild exercise. -use miralax if needed. -if pain left upper quadrant and constipation persist may need refer back to GI MD.  3. Rib pain on left side -some left lower rib area pain, cough and hx of smoking. -get cxr to assess differential as pain is in left upper quadrant and lower rib area with ct scan abd not revealing cause.  4. Smoker -recommend smoking cessation. - DG Ribs Unilateral W/Chest Left; Future   Follow up date to be determined after lab and imaging review.    Valerie Woolf, PA-C

## 2024-02-27 ENCOUNTER — Ambulatory Visit: Payer: Self-pay | Admitting: Medical

## 2024-02-27 ENCOUNTER — Other Ambulatory Visit: Payer: Self-pay

## 2024-02-27 ENCOUNTER — Other Ambulatory Visit (HOSPITAL_COMMUNITY): Payer: Self-pay

## 2024-02-27 MED ORDER — MELOXICAM 15 MG PO TABS
15.0000 mg | ORAL_TABLET | Freq: Every day | ORAL | 0 refills | Status: DC
  Filled 2024-02-27: qty 30, 30d supply, fill #0

## 2024-02-27 MED ORDER — HYDROCODONE-ACETAMINOPHEN 10-325 MG PO TABS
1.0000 | ORAL_TABLET | ORAL | 0 refills | Status: DC | PRN
  Filled 2024-02-27 – 2024-03-05 (×2): qty 180, 30d supply, fill #0

## 2024-02-27 MED ORDER — HYDROCODONE-ACETAMINOPHEN 10-325 MG PO TABS
1.0000 | ORAL_TABLET | ORAL | 0 refills | Status: DC | PRN
  Filled 2024-04-03: qty 180, 30d supply, fill #0

## 2024-02-27 NOTE — Addendum Note (Signed)
 Addended by: DORINA DALLAS HERO on: 02/27/2024 09:00 AM   Modules accepted: Orders

## 2024-03-04 ENCOUNTER — Telehealth (HOSPITAL_BASED_OUTPATIENT_CLINIC_OR_DEPARTMENT_OTHER): Payer: Self-pay

## 2024-03-05 ENCOUNTER — Other Ambulatory Visit: Payer: Self-pay | Admitting: Internal Medicine

## 2024-03-05 ENCOUNTER — Other Ambulatory Visit (HOSPITAL_COMMUNITY): Payer: Self-pay

## 2024-03-11 ENCOUNTER — Ambulatory Visit (HOSPITAL_BASED_OUTPATIENT_CLINIC_OR_DEPARTMENT_OTHER)
Admission: RE | Admit: 2024-03-11 | Discharge: 2024-03-11 | Disposition: A | Discharge status: Home / Self Care | Source: Ambulatory Visit | Attending: Medical | Admitting: Medical

## 2024-03-11 DIAGNOSIS — Z13820 Encounter for screening for osteoporosis: Secondary | ICD-10-CM | POA: Insufficient documentation

## 2024-03-11 DIAGNOSIS — Z780 Asymptomatic menopausal state: Secondary | ICD-10-CM | POA: Insufficient documentation

## 2024-03-11 MED ORDER — ALENDRONATE SODIUM 10 MG PO TABS: 10.0000 mg | ORAL_TABLET | Freq: Every day | ORAL | 3 refills | Status: AC

## 2024-03-11 NOTE — Addendum Note (Signed)
 Addended by: DORINA DALLAS HERO on: 03/11/2024 07:31 PM   Modules accepted: Orders

## 2024-03-11 NOTE — Addendum Note (Signed)
 Addended by: DORINA DALLAS HERO on: 03/11/2024 07:30 PM   Modules accepted: Orders

## 2024-04-02 ENCOUNTER — Other Ambulatory Visit: Payer: Self-pay

## 2024-04-02 MED ORDER — BUDESONIDE-FORMOTEROL FUMARATE 160-4.5 MCG/ACT IN AERO: 2.0000 | INHALATION_SPRAY | Freq: Two times a day (BID) | RESPIRATORY_TRACT | 3 refills | Status: AC

## 2024-04-03 ENCOUNTER — Other Ambulatory Visit: Payer: Self-pay | Admitting: Medical

## 2024-04-03 ENCOUNTER — Other Ambulatory Visit (HOSPITAL_COMMUNITY): Payer: Self-pay

## 2024-04-04 ENCOUNTER — Other Ambulatory Visit: Payer: Self-pay | Admitting: Medical

## 2024-04-13 ENCOUNTER — Other Ambulatory Visit: Payer: Self-pay | Admitting: Physician Assistant

## 2024-04-17 ENCOUNTER — Ambulatory Visit (INDEPENDENT_AMBULATORY_CARE_PROVIDER_SITE_OTHER): Admitting: Family Medicine

## 2024-04-17 ENCOUNTER — Ambulatory Visit: Payer: Self-pay | Admitting: Family Medicine

## 2024-04-17 ENCOUNTER — Ambulatory Visit (HOSPITAL_BASED_OUTPATIENT_CLINIC_OR_DEPARTMENT_OTHER)
Admission: RE | Admit: 2024-04-17 | Discharge: 2024-04-17 | Disposition: A | Discharge status: Home / Self Care | Source: Ambulatory Visit | Attending: Family Medicine | Admitting: Family Medicine

## 2024-04-17 VITALS — BP 124/80 | HR 101 | Temp 98.1°F | Resp 18 | Ht 64.0 in | Wt 137.4 lb

## 2024-04-17 DIAGNOSIS — M8580 Other specified disorders of bone density and structure, unspecified site: Secondary | ICD-10-CM

## 2024-04-17 DIAGNOSIS — M25571 Pain in right ankle and joints of right foot: Secondary | ICD-10-CM | POA: Insufficient documentation

## 2024-04-17 NOTE — Progress Notes (Signed)
 Subjective:    Patient ID: Valerie Key, female    DOB: May 09, 1961, 63 y.o.   MRN: [id number]  Chief Complaint  Patient presents with   Foot Injury    Right foot, stepped off a curb and twisted foot and ankle.     HPI Patient is in today for pain in R food Discussed the use of AI scribe software for clinical note transcription with the patient, who gave verbal consent to proceed.  History of Present Illness Valerie Key is a 63 year old female who presents with right foot pain after twisting it while walking.  She twisted her right foot yesterday while walking down a sidewalk at work where the path drops off into the grass, experiencing immediate pain at the time of the injury. Last night, she had shooting pain throughout the night, which prevented her from getting comfortable. Today, the pain has persisted, and she notes increased swelling in her right ankle.  She is currently taking prescribed pain medication for pain management, but it is not effectively alleviating the pain in her foot, similar to her experience with rib pain.  She has a history of a fractured rib, for which she underwent a bone density test and was prescribed medication. She is unsure how the rib fracture occurred and denies any recent illness or cough but mentions having issues with constipation around the time of the fracture.   Past Medical History:  Diagnosis Date   Allergy    SEASONAL   Anxiety    Arthritis    Asthma    Back pain    Chronic pain syndrome    COPD (chronic obstructive pulmonary disease) (HCC)    Depression    Diverticulosis    Fatty liver    Gastritis    GERD (gastroesophageal reflux disease)    Hiatal hernia    History of kidney stones    Hyperlipidemia     Past Surgical History:  Procedure Laterality Date   ABDOMINAL HYSTERECTOMY     with bladder tact   BLADDER TACK     CHOLECYSTECTOMY N/A 07/26/2017   Procedure: LAPAROSCOPIC CHOLECYSTECTOMY;   Surgeon: Signe Mitzie LABOR, MD;  Location: [facility];  Service: General;  Laterality: N/A;    Family History  Problem Relation Age of Onset   Diabetes Father    Colon cancer Paternal Uncle        x 2   Heart disease Maternal Grandfather    Heart disease Paternal Grandfather     Social History   Socioeconomic History   Marital status: Divorced    Spouse name: Not on file   Number of children: 2   Years of education: Not on file   Highest education level: 12th grade  Occupational History   Occupation: Marine Scientist: [employer]  Tobacco Use   Smoking status: Every Day    Current packs/day: 0.50    Average packs/day: 0.5 packs/day for 39.0 years (19.5 ttl pk-yrs)    Types: Cigarettes   Smokeless tobacco: Never   Tobacco comments:    6 ciggs daily- 11/10/21 LMR  Vaping Use   Vaping status: Never Used  Substance and Sexual Activity   Alcohol use: Yes    Comment: 1-2 daily   Drug use: No   Sexual activity: Yes    Birth control/protection: Surgical  Other Topics Concern   Not on file  Social History Narrative   Not on file   Social  Drivers of Health   Financial Resource Strain: Low Risk  (04/17/2024)   Overall Financial Resource Strain (CARDIA)    Difficulty of Paying Living Expenses: Not very hard  Food Insecurity: No Food Insecurity (04/17/2024)   Hunger Vital Sign    Worried About Running Out of Food in the Last Year: Never true    Ran Out of Food in the Last Year: Never true  Transportation Needs: No Transportation Needs (04/17/2024)   PRAPARE - Administrator, Civil Service (Medical): No    Lack of Transportation (Non-Medical): No  Physical Activity: Inactive (04/17/2024)   Exercise Vital Sign    Days of Exercise per Week: 0 days    Minutes of Exercise per Session: Not on file  Stress: No Stress Concern Present (04/17/2024)   [employer] of Occupational Health - Occupational Stress Questionnaire    Feeling of Stress: Not at  all  Social Connections: Moderately Isolated (04/17/2024)   Social Connection and Isolation Panel    Frequency of Communication with Friends and Family: More than three times a week    Frequency of Social Gatherings with Friends and Family: Three times a week    Attends Religious Services: 1 to 4 times per year    Active Member of Clubs or Organizations: No    Attends Banker Meetings: Not on file    Marital Status: Divorced  Intimate Partner Violence: Not At Risk (06/28/2018)   Humiliation, Afraid, Rape, and Kick questionnaire    Fear of Current or Ex-Partner: No    Emotionally Abused: No    Physically Abused: No    Sexually Abused: No    Outpatient Medications Prior to Visit  Medication Sig Dispense Refill   albuterol  (VENTOLIN  HFA) 108 (90 Base) MCG/ACT inhaler INHALE 1-2 PUFFS BY MOUTH EVERY 6 HOURS AS NEEDED FOR WHEEZE OR SHORTNESS OF BREATH 6.7 each 2   alendronate (FOSAMAX) 10 MG tablet Take 1 tablet (10 mg total) by mouth daily before breakfast. Take with a full glass of water on an empty stomach. 90 tablet 3   ALPRAZolam  (XANAX ) 0.5 MG tablet Take 0.25 mg by mouth daily as needed for anxiety.      aspirin  EC 81 MG tablet Take 1 tablet (81 mg total) by mouth daily. Swallow whole. 90 tablet 3   budesonide -formoterol  (SYMBICORT ) 160-4.5 MCG/ACT inhaler Inhale 2 puffs into the lungs every 12 (twelve) hours. MUST KEEP APPOINTMENT 10.2 each 3   Cholecalciferol (VITAMIN D3) 125 MCG (5000 UT) CAPS Take 1 capsule by mouth daily.     citalopram  (CELEXA ) 20 MG tablet TAKE 1 TABLET BY MOUTH EVERY DAY 90 tablet 0   fenofibrate  (TRICOR ) 48 MG tablet Take 1 tablet (48 mg total) by mouth daily. 90 tablet 3   fluconazole  (DIFLUCAN ) 150 MG tablet Take 150 mg by mouth once.     fluticasone  (FLONASE ) 50 MCG/ACT nasal spray Place 2 sprays into both nostrils daily. 16 mL 5   hydrochlorothiazide  (HYDRODIURIL ) 12.5 MG tablet Take 1 tablet (12.5 mg total) by mouth daily. 90 tablet 0    HYDROcodone -acetaminophen  (NORCO) 10-325 MG tablet Take 1 tablet by mouth every 4 hours as needed 01/31/24 180 tablet 0   HYDROcodone -acetaminophen  (NORCO) 10-325 MG tablet Take 1 tablet by mouth every 4 (four) hours as needed. 180 tablet 0   HYDROcodone -acetaminophen  (NORCO) 10-325 MG tablet Take 1 tablet by mouth every 4 (four) hours as needed. 180 tablet 0   levocetirizine (XYZAL ) 5 MG tablet Take 1  tablet (5 mg total) by mouth every evening. 90 tablet 11   meloxicam  (MOBIC ) 15 MG tablet Take 1 tablet (15 mg total) by mouth daily. 30 tablet 0   pantoprazole  (PROTONIX ) 40 MG tablet TAKE 1 TABLET BY MOUTH EVERY DAY 90 tablet 4   rosuvastatin  (CRESTOR ) 10 MG tablet Take 1 tablet (10 mg total) by mouth daily. 90 tablet 3   Tiotropium Bromide  Monohydrate (SPIRIVA  RESPIMAT) 2.5 MCG/ACT AERS INHALE 2 PUFFS BY MOUTH INTO THE LUNGS DAILY 4 g 5   vitamin B-12 (CYANOCOBALAMIN ) 100 MCG tablet Take 100 mcg by mouth daily.     vitamin E  (VITAMIN E ) 400 UNIT capsule Take 2 capsules (800 Units total) by mouth daily. 60 capsule 2   No facility-administered medications prior to visit.    Allergies  Allergen Reactions   Sulfa Antibiotics Hives and Rash    Review of Systems  Constitutional:  Negative for fever and malaise/fatigue.  HENT:  Negative for congestion.   Eyes:  Negative for blurred vision.  Respiratory:  Negative for shortness of breath.   Cardiovascular:  Negative for chest pain, palpitations and leg swelling.  Gastrointestinal:  Negative for abdominal pain, blood in stool and nausea.  Genitourinary:  Negative for dysuria and frequency.  Musculoskeletal:  Positive for falls and joint pain.  Skin:  Negative for rash.  Neurological:  Negative for dizziness, loss of consciousness and headaches.  Endo/Heme/Allergies:  Negative for environmental allergies.  Psychiatric/Behavioral:  Negative for depression. The patient is not nervous/anxious.        Objective:    Physical Exam Vitals and  nursing note reviewed.  Constitutional:      General: She is not in acute distress.    Appearance: Normal appearance. She is well-developed.  HENT:     Head: Normocephalic and atraumatic.  Eyes:     General: No scleral icterus.       Right eye: No discharge.        Left eye: No discharge.  Cardiovascular:     Rate and Rhythm: Normal rate and regular rhythm.     Heart sounds: No murmur heard. Pulmonary:     Effort: Pulmonary effort is normal. No respiratory distress.     Breath sounds: Normal breath sounds.  Musculoskeletal:     Cervical back: Normal range of motion and neck supple.     Right lower leg: No edema.     Left lower leg: No edema.     Right ankle: Swelling present. Decreased range of motion.     Comments: R ankle--- pain with palp lat mall Swelling   Skin:    General: Skin is warm and dry.  Neurological:     Mental Status: She is alert and oriented to person, place, and time.  Psychiatric:        Mood and Affect: Mood normal.        Behavior: Behavior normal.        Thought Content: Thought content normal.        Judgment: Judgment normal.     BP 124/80 (BP Location: Left Arm, Patient Position: Sitting, Cuff Size: Normal)   Pulse (!) 101   Temp 98.1 F (36.7 C) (Oral)   Resp 18   Ht 5' 4 (1.626 m)   Wt 137 lb 6.4 oz (62.3 kg)   SpO2 93%   BMI 23.58 kg/m  Wt Readings from Last 3 Encounters:  04/17/24 137 lb 6.4 oz (62.3 kg)  02/26/24 137 lb 9.6 oz (  62.4 kg)  02/21/24 135 lb 2 oz (61.3 kg)    Diabetic Foot Exam - Simple   No data filed    Lab Results  Component Value Date   WBC 10.8 (H) 02/21/2024   HGB 14.3 02/21/2024   HCT 43.3 02/21/2024   PLT 288.0 02/21/2024   GLUCOSE 89 02/26/2024   CHOL 159 02/05/2023   TRIG 208.0 (H) 02/05/2023   HDL 48.10 02/05/2023   LDLDIRECT 63.0 10/27/2020   LDLCALC 69 02/05/2023   ALT 15 02/26/2024   AST 20 02/26/2024   NA 142 02/26/2024   K 4.1 02/26/2024   CL 97 02/26/2024   CREATININE 0.79 02/26/2024    BUN 6 02/26/2024   CO2 37 (H) 02/26/2024   TSH 2.34 12/29/2021   INR 0.9 02/11/2020   HGBA1C 6.2 02/05/2023    Lab Results  Component Value Date   TSH 2.34 12/29/2021   Lab Results  Component Value Date   WBC 10.8 (H) 02/21/2024   HGB 14.3 02/21/2024   HCT 43.3 02/21/2024   MCV 90.1 02/21/2024   PLT 288.0 02/21/2024   Lab Results  Component Value Date   NA 142 02/26/2024   K 4.1 02/26/2024   CO2 37 (H) 02/26/2024   GLUCOSE 89 02/26/2024   BUN 6 02/26/2024   CREATININE 0.79 02/26/2024   BILITOT 0.5 02/26/2024   ALKPHOS 62 02/26/2024   AST 20 02/26/2024   ALT 15 02/26/2024   PROT 7.0 02/26/2024   ALBUMIN 4.3 02/26/2024   CALCIUM  9.9 02/26/2024   ANIONGAP 7 02/11/2020   GFR 80.83 02/26/2024   Lab Results  Component Value Date   CHOL 159 02/05/2023   Lab Results  Component Value Date   HDL 48.10 02/05/2023   Lab Results  Component Value Date   LDLCALC 69 02/05/2023   Lab Results  Component Value Date   TRIG 208.0 (H) 02/05/2023   Lab Results  Component Value Date   CHOLHDL 3 02/05/2023   Lab Results  Component Value Date   HGBA1C 6.2 02/05/2023       Assessment & Plan:  Acute right ankle pain -     DG Ankle Complete Right; Future  Osteopenia, unspecified location -     VITAMIN D  25 Hydroxy (Vit-D Deficiency, Fractures); Future   Assessment and Plan Assessment & Plan Right ankle and right foot pain and swelling   Acute pain and swelling in the right ankle and foot followed a twisting injury while walking. The pain is severe, shooting, and worsens with movement. Swelling has increased since the injury. Ordered right ankle and foot x-ray. Applied splint to the right foot. Referred to [facility] urgent care for further evaluation and potential casting if a fracture is confirmed.   Macayla Ekdahl R Lowne Chase, DO

## 2024-04-18 LAB — VITAMIN D 25 HYDROXY (VIT D DEFICIENCY, FRACTURES): Vit D, 25-Hydroxy: 33 ng/mL (ref 30–100)

## 2024-04-19 NOTE — Progress Notes (Signed)
 "     Valerie Console, PA-C [address] [place], [place]  [address] Phone: [phone]   Primary Care Physician: Dorina Loving, [place]  Primary Gastroenterologist:  Valerie Console, PA-C / Dr. Gordy Starch   Chief Complaint:  Dysphagia, Epigastric Pain, Constipation       HPI:   Discussed the use of AI scribe software for clinical note transcription with the patient, who gave verbal consent to proceed. History of Present Illness [name] is a 63 year old female with a history of hiatal hernia who presents with dysphagia and abdominal pain.  Patient last saw Deanna May, PA-C 02/21/2024 to evaluate LUQ pain and constipation.  She experiences pain in the left upper abdomen, which initially led to a recommendation for a bland diet. Despite this, the pain persisted, and she was diagnosed with a left anterior rib fracture (rib number eight) via x-ray. The rib injury required no specific treatment other than allowing it to heal naturally. Although the rib pain has subsided, she continues to experience abdominal discomfort in the epigastrium.  For the past four weeks, she has noticed that food feels like it gets stuck in the middle of her lower chest, particularly in the epigastric region, where her hernia is located. This sensation occurs during swallowing and takes a short time to resolve, although it feels longer to her. No vomiting, but she mentions occasional gagging in the morning due to a cold or allergies, resulting in the expulsion of some fluid, which she believes is phlegm. No nausea, but occasional heartburn is reported.  She has a history of a colonoscopy and upper endoscopy in 2019, which revealed a small inflammatory rectal polyp and a small (2 cm) hiatal hernia. The colonoscopy was recommended to be repeated in ten years. She underwent a CT scan with contrast on February 20, 2022, which showed normal findings for the stomach and intestines. The scan also revealed  diverticulosis without diverticulitis.  She experiences constipation, which she attributes to hydrocodone  use. She has been using stool softeners, which have been somewhat helpful. She reports having bowel movements daily, although the quality varies. She has not used Miralax or Senokot S.  Last colonoscopy was done in 2019 with finding of sigmoid diverticulosis and 1 inflammatory rectal polyp and is indicated for 10-year interval follow-up.    EGD in November 2019 with normal-appearing esophagus and 2 cm hiatal hernia otherwise negative exam.   Ultrasound June 2023, status postcholecystectomy, CBD 7.9 mm, and noted diffuse increased echotexture throughout the liver no focal mass, portal vein patent.  PMH: COPD, asthma, GERD, hypertension, anxiety, B12 deficiency  Current Outpatient Medications  Medication Sig Dispense Refill   albuterol  (VENTOLIN  HFA) 108 (90 Base) MCG/ACT inhaler INHALE 1-2 PUFFS BY MOUTH EVERY 6 HOURS AS NEEDED FOR WHEEZE OR SHORTNESS OF BREATH 6.7 each 2   alendronate  (FOSAMAX ) 10 MG tablet Take 1 tablet (10 mg total) by mouth daily before breakfast. Take with a full glass of water on an empty stomach. 90 tablet 3   ALPRAZolam  (XANAX ) 0.5 MG tablet Take 0.25 mg by mouth daily as needed for anxiety.      aspirin  EC 81 MG tablet Take 1 tablet (81 mg total) by mouth daily. Swallow whole. 90 tablet 3   budesonide -formoterol  (SYMBICORT ) 160-4.5 MCG/ACT inhaler Inhale 2 puffs into the lungs every 12 (twelve) hours. MUST KEEP APPOINTMENT 10.2 each 3   Calcium  Carbonate-Vit D-Min (CALCIUM  1200 PO) Take 2 tablets by mouth daily.  Cholecalciferol (VITAMIN D3) 125 MCG (5000 UT) CAPS Take 1 capsule by mouth daily.     citalopram  (CELEXA ) 20 MG tablet TAKE 1 TABLET BY MOUTH EVERY DAY 90 tablet 0   fenofibrate  (TRICOR ) 48 MG tablet Take 1 tablet (48 mg total) by mouth daily. 90 tablet 3   fluconazole  (DIFLUCAN ) 150 MG tablet Take 150 mg by mouth once.     fluticasone  (FLONASE ) 50  MCG/ACT nasal spray Place 2 sprays into both nostrils daily. 16 mL 5   hydrochlorothiazide  (HYDRODIURIL ) 12.5 MG tablet Take 1 tablet (12.5 mg total) by mouth daily. 90 tablet 0   HYDROcodone -acetaminophen  (NORCO) 10-325 MG tablet Take 1 tablet by mouth every 4 (four) hours as needed. 180 tablet 0   levocetirizine (XYZAL ) 5 MG tablet Take 1 tablet (5 mg total) by mouth every evening. 90 tablet 11   meloxicam  (MOBIC ) 15 MG tablet Take 1 tablet (15 mg total) by mouth daily. 30 tablet 0   pantoprazole  (PROTONIX ) 40 MG tablet TAKE 1 TABLET BY MOUTH EVERY DAY 90 tablet 4   rosuvastatin  (CRESTOR ) 10 MG tablet Take 1 tablet (10 mg total) by mouth daily. 90 tablet 3   Tiotropium Bromide  Monohydrate (SPIRIVA  RESPIMAT) 2.5 MCG/ACT AERS INHALE 2 PUFFS BY MOUTH INTO THE LUNGS DAILY 4 g 5   vitamin B-12 (CYANOCOBALAMIN ) 100 MCG tablet Take 100 mcg by mouth daily.     vitamin E  (VITAMIN E ) 400 UNIT capsule Take 2 capsules (800 Units total) by mouth daily. 60 capsule 2   No current facility-administered medications for this visit.    Allergies as of 04/20/2024 - Review Complete 04/20/2024  Allergen Reaction Noted   Sulfa antibiotics Hives and Rash 02/12/2012    Past Medical History:  Diagnosis Date   Allergy    SEASONAL   Anxiety    Arthritis    Asthma    Back pain    Chronic pain syndrome    COPD (chronic obstructive pulmonary disease) (HCC)    Depression    Diverticulosis    Fatty liver    Gastritis    GERD (gastroesophageal reflux disease)    Hiatal hernia    History of kidney stones    Hyperlipidemia     Past Surgical History:  Procedure Laterality Date   ABDOMINAL HYSTERECTOMY     with bladder tact   BLADDER TACK     CHOLECYSTECTOMY N/A 07/26/2017   Procedure: LAPAROSCOPIC CHOLECYSTECTOMY;  Surgeon: Signe Mitzie LABOR, MD;  Location: [facility];  Service: General;  Laterality: N/A;    Review of Systems:    All systems reviewed and negative except where noted in HPI.    Physical  Exam:  BP 124/60   Pulse 100   Ht 5' 2 (1.575 m)   Wt 134 lb 8 oz (61 kg)   SpO2 93%   BMI 24.60 kg/m  No LMP recorded. Patient has had a hysterectomy.  General: Well-nourished, well-developed in no acute distress.  Lungs: Clear to auscultation bilaterally. Non-labored. Heart: Regular rate and rhythm, no murmurs rubs or gallops.  Abdomen: Bowel sounds are normal; Abdomen is Soft; No hepatosplenomegaly, masses or hernias;  Moderate Epigastric Abdominal Tenderness; Rest of abdomen is not tender.  No guarding or rebound tenderness. Neuro: Alert and oriented x 3.  Grossly intact.  Psych: Alert and cooperative, normal mood and affect.   Imaging Studies: DG Ankle Complete Right Result Date: 04/17/2024 EXAM: 3 OR MORE VIEW(S) XRAY OF THE RIGHT ANKLE 04/17/2024 04:19:23 PM CLINICAL HISTORY: r  ankle pain COMPARISON: None available. FINDINGS: BONES AND JOINTS: No acute fracture. No focal osseous lesion. No joint dislocation. No ankle mortise widening. The talar dome is intact. SOFT TISSUES: Moderate soft tissue swelling about the lateral ankle. IMPRESSION: 1. Moderate soft tissue swelling about the lateral ankle. Otherwise, no acute fracture or dislocation. Electronically signed by: Rogelia Myers MD 04/17/2024 04:32 PM EST RP Workstation: [id number]    Labs: CBC    Component Value Date/Time   WBC 10.8 (H) 02/21/2024 1555   RBC 4.80 02/21/2024 1555   HGB 14.3 02/21/2024 1555   HGB 14.0 02/11/2020 0819   HCT 43.3 02/21/2024 1555   PLT 288.0 02/21/2024 1555   PLT 243 02/11/2020 0819   MCV 90.1 02/21/2024 1555   MCH 29.3 11/13/2023 0924   MCHC 33.1 02/21/2024 1555   RDW 14.4 02/21/2024 1555   LYMPHSABS 3.3 02/21/2024 1555   MONOABS 0.8 02/21/2024 1555   EOSABS 0.3 02/21/2024 1555   BASOSABS 0.1 02/21/2024 1555    CMP     Component Value Date/Time   NA 142 02/26/2024 0959   K 4.1 02/26/2024 0959   CL 97 02/26/2024 0959   CO2 37 (H) 02/26/2024 0959   GLUCOSE 89 02/26/2024 0959    BUN 6 02/26/2024 0959   CREATININE 0.79 02/26/2024 0959   CREATININE 0.90 02/11/2020 0819   CALCIUM  9.9 02/26/2024 0959   PROT 7.0 02/26/2024 0959   ALBUMIN 4.3 02/26/2024 0959   AST 20 02/26/2024 0959   AST 18 02/11/2020 0819   ALT 15 02/26/2024 0959   ALT 16 02/11/2020 0819   ALKPHOS 62 02/26/2024 0959   BILITOT 0.5 02/26/2024 0959   BILITOT 0.3 02/11/2020 0819   GFRNONAA >60 02/11/2020 0819   GFRAA >60 02/11/2020 0819     Assessment and Plan:   Dangela How is a 63 y.o. y/o female presents for evaluation of:  Solid Food Dysphagia - Scheduling EGD with dilation I discussed risks of EGD with dilation with patient to include risk of bleeding, perforation, and risk of sedation.  Patient expressed understanding and agrees to proceed with EGD.   Epigastric Pain - Schedule EGD: **Check biopsies for H. Pylori.  Evaluate for gastritis or peptic ulcer. - Avoid NSAIDs.  Hiatal Hernia / GERD - Patient education discussed - Continue pantoprazole  40 mg once daily. - Avoid GERD trigger foods and drinks.    Chronic Opioid Induced Constipation. - Start Rx Senokot S 50mg  / 8.6mg  2 tablets once or twice daily - If Senokot S is not beneficial, then try Linzess, Amitiza, Trulance, or Ibsrela. - She tried MiraLAX in the past but could not tolerate the powder. - Recommend High Fiber diet with fruits, vegetables, and whole grains. - Drink 64 ounces of Fluids Daily.    Valerie Console, PA-C  Follow up 4 weeks after EGD with TG.   "

## 2024-04-20 ENCOUNTER — Ambulatory Visit (INDEPENDENT_AMBULATORY_CARE_PROVIDER_SITE_OTHER): Admitting: Physician Assistant

## 2024-04-20 ENCOUNTER — Encounter: Payer: Self-pay | Admitting: Physician Assistant

## 2024-04-20 VITALS — BP 124/60 | HR 100 | Ht 62.0 in | Wt 134.5 lb

## 2024-04-20 DIAGNOSIS — R1310 Dysphagia, unspecified: Secondary | ICD-10-CM | POA: Diagnosis not present

## 2024-04-20 DIAGNOSIS — K5904 Chronic idiopathic constipation: Secondary | ICD-10-CM | POA: Diagnosis not present

## 2024-04-20 DIAGNOSIS — R1013 Epigastric pain: Secondary | ICD-10-CM

## 2024-04-20 MED ORDER — SENNOSIDES-DOCUSATE SODIUM 8.6-50 MG PO TABS: 2.0000 | ORAL_TABLET | Freq: Two times a day (BID) | ORAL | 3 refills | Status: AC

## 2024-04-20 NOTE — Patient Instructions (Addendum)
 For Constipation: - Start Senokot S 50mg  / 8.6mg , Take 2 tablets once or twice daily.  I sent prescription to your pharmacy. - - Recommend High Fiber diet with fruits, vegetables, and whole grains. - Drink 64 ounces of Fluids Daily.   You have been scheduled for an Endoscopy. Please follow the written instructions given to you at your visit today.  If you use inhalers (even only as needed), please bring them with you on the day of your procedure.  DO NOT TAKE 7 DAYS PRIOR TO TEST- Trulicity (dulaglutide) Ozempic, Wegovy (semaglutide) Mounjaro (tirzepatide) Bydureon Bcise (exanatide extended release)  DO NOT TAKE 1 DAY PRIOR TO YOUR TEST Rybelsus (semaglutide) Adlyxin (lixisenatide) Victoza (liraglutide) Byetta (exanatide) ___________________________________________________________________________  Please follow up sooner if symptoms increase or worsen __________________________________________________________________________  Due to recent changes in healthcare laws, you may see the results of your imaging and laboratory studies on MyChart before your provider has had a chance to review them.  We understand that in some cases there may be results that are confusing or concerning to you. Not all laboratory results come back in the same time frame and the provider may be waiting for multiple results in order to interpret others.  Please give us  48 hours in order for your provider to thoroughly review all the results before contacting the office for clarification of your results.   Thank you for trusting me with your gastrointestinal care!   Ellouise Console, PA-C _______________________________________________________  If your blood pressure at your visit was 140/90 or greater, please contact your primary care physician to follow up on this.  _______________________________________________________  If you are age 51 or older, your body mass index should be between 23-30. Your Body mass  index is 24.6 kg/m. If this is out of the aforementioned range listed, please consider follow up with your Primary Care Provider.  If you are age 79 or younger, your body mass index should be between 19-25. Your Body mass index is 24.6 kg/m. If this is out of the aformentioned range listed, please consider follow up with your Primary Care Provider.   ________________________________________________________  The [facility] providers would like to encourage you to use MYCHART to communicate with providers for non-urgent requests or questions.  Due to long hold times on the telephone, sending your provider a message by [facility] may be a faster and more efficient way to get a response.  Please allow 48 business hours for a response.  Please remember that this is for non-urgent requests.  _______________________________________________________

## 2024-04-23 ENCOUNTER — Ambulatory Visit: Admitting: Internal Medicine

## 2024-04-23 ENCOUNTER — Encounter: Payer: Self-pay | Admitting: Internal Medicine

## 2024-04-23 VITALS — BP 131/68 | HR 89 | Temp 97.6°F | Resp 22 | Ht 62.0 in | Wt 134.8 lb

## 2024-04-23 DIAGNOSIS — R1319 Other dysphagia: Secondary | ICD-10-CM

## 2024-04-23 DIAGNOSIS — K319 Disease of stomach and duodenum, unspecified: Secondary | ICD-10-CM | POA: Diagnosis not present

## 2024-04-23 DIAGNOSIS — K222 Esophageal obstruction: Secondary | ICD-10-CM

## 2024-04-23 DIAGNOSIS — K2970 Gastritis, unspecified, without bleeding: Secondary | ICD-10-CM

## 2024-04-23 DIAGNOSIS — K449 Diaphragmatic hernia without obstruction or gangrene: Secondary | ICD-10-CM

## 2024-04-23 DIAGNOSIS — R1013 Epigastric pain: Secondary | ICD-10-CM

## 2024-04-23 DIAGNOSIS — K219 Gastro-esophageal reflux disease without esophagitis: Secondary | ICD-10-CM

## 2024-04-23 MED ORDER — SODIUM CHLORIDE 0.9 % IV SOLN: 500.0000 mL | Freq: Once | INTRAVENOUS | Status: DC

## 2024-04-23 NOTE — Progress Notes (Signed)
 See office note dated 04/20/2024 for details of current H&P  Patient with a history of GERD, hiatal hernia presenting with solid food dysphagia.  Also with epigastric pain.  Plan upper endoscopy with possible dilation today. She remains appropriate for upper endoscopy in the [facility] today

## 2024-04-23 NOTE — Patient Instructions (Addendum)
 Thank you for letting us  take care of your healthcare needs. Please see handouts given to you on Hiatal Hernia and Post Dilation Diet. Resume Previous Medications including Pantoprazole  40 mg Daily.     YOU HAD AN ENDOSCOPIC PROCEDURE TODAY AT THE  ENDOSCOPY CENTER:   Refer to the procedure report that was given to you for any specific questions about what was found during the examination.  If the procedure report does not answer your questions, please call your gastroenterologist to clarify.  If you requested that your care partner not be given the details of your procedure findings, then the procedure report has been included in a sealed envelope for you to review at your convenience later.  YOU SHOULD EXPECT: Some feelings of bloating in the abdomen. Passage of more gas than usual.  Walking can help get rid of the air that was put into your GI tract during the procedure and reduce the bloating. If you had a lower endoscopy (such as a colonoscopy or flexible sigmoidoscopy) you may notice spotting of blood in your stool or on the toilet paper. If you underwent a bowel prep for your procedure, you may not have a normal bowel movement for a few days.  Please Note:  You might notice some irritation and congestion in your nose or some drainage.  This is from the oxygen used during your procedure.  There is no need for concern and it should clear up in a day or so.  SYMPTOMS TO REPORT IMMEDIATELY:   Following upper endoscopy (EGD)  Vomiting of blood or coffee ground material  New chest pain or pain under the shoulder blades  Painful or persistently difficult swallowing  New shortness of breath  Fever of 100F or higher  Black, tarry-looking stools  For urgent or emergent issues, a gastroenterologist can be reached at any hour by calling [phone]. Do not use MyChart messaging for urgent concerns.    DIET:  Clear liquid diet until 1130 am then Soft diet for the rest of the day  Tomorrow you may proceed to your regular diet.  Drink plenty of fluids but you should avoid alcoholic beverages for 24 hours.  ACTIVITY:  You should plan to take it easy for the rest of today and you should NOT DRIVE or use heavy machinery until tomorrow (because of the sedation medicines used during the test).    FOLLOW UP: Our staff will call the number listed on your records the next business day following your procedure.  We will call around 7:15- 8:00 am to check on you and address any questions or concerns that you may have regarding the information given to you following your procedure. If we do not reach you, we will leave a message.     If any biopsies were taken you will be contacted by phone or by letter within the next 1-3 weeks.  Please call us  at [phone] if you have not heard about the biopsies in 3 weeks.    SIGNATURES/CONFIDENTIALITY: You and/or your care partner have signed paperwork which will be entered into your electronic medical record.  These signatures attest to the fact that that the information above on your After Visit Summary has been reviewed and is understood.  Full responsibility of the confidentiality of this discharge information lies with you and/or your care-partner.

## 2024-04-23 NOTE — Progress Notes (Signed)
 Report to PACU, RN, vss, BBS= Clear.

## 2024-04-23 NOTE — Progress Notes (Signed)
 Pt's states no medical or surgical changes since previsit or office visit.

## 2024-04-23 NOTE — Progress Notes (Signed)
 Called to room to assist during endoscopic procedure.  Patient ID and intended procedure confirmed with present staff. Received instructions for my participation in the procedure from the performing physician.

## 2024-04-23 NOTE — Op Note (Signed)
 [facility] Patient Name: Valerie Key Procedure Date: 04/23/2024 9:04 AM MRN: [id number] Endoscopist: Gordy CHRISTELLA Starch , MD, [id number] Age: 63 Referring MD:  Date of Birth: 1961-05-13 Gender: Female Account #: [id number] Procedure:                Upper GI endoscopy Indications:              Epigastric abdominal pain, Dysphagia,                            Gastro-esophageal reflux disease, current                            pantoprazole  40 mg daily Medicines:                Monitored Anesthesia Care Procedure:                Pre-Anesthesia Assessment:                           - Prior to the procedure, a History and Physical                            was performed, and patient medications and                            allergies were reviewed. The patient's tolerance of                            previous anesthesia was also reviewed. The risks                            and benefits of the procedure and the sedation                            options and risks were discussed with the patient.                            All questions were answered, and informed consent                            was obtained. Prior Anticoagulants: The patient has                            taken no anticoagulant or antiplatelet agents. ASA                            Grade Assessment: II - A patient with mild systemic                            disease. After reviewing the risks and benefits,                            the patient was deemed in satisfactory condition to  undergo the procedure.                           After obtaining informed consent, the endoscope was                            passed under direct vision. Throughout the                            procedure, the patient's blood pressure, pulse, and                            oxygen saturations were monitored continuously. The                            GIF [id number] #[id number] was introduced through the                             mouth, and advanced to the second part of duodenum.                            The upper GI endoscopy was accomplished without                            difficulty. The patient tolerated the procedure                            well. Scope In: Scope Out: Findings:                 Two benign-appearing, intrinsic moderate                            (circumferential scarring or stenosis; an endoscope                            may pass) stenoses were found 34 to 35 cm from the                            incisors. The narrowest stenosis measured 1.1 cm                            (inner diameter) x 1 cm (in length). The stenoses                            were traversed. A TTS dilator was passed through                            the scope. Dilation with a 15-16.5-18 mm balloon                            dilator was performed to 15 mm. The dilation site  was examined and showed moderate mucosal disruption.                           A 4 cm hiatal hernia was present.                           The gastroesophageal flap valve was visualized                            endoscopically and classified as Hill Grade IV (no                            fold, wide open lumen, hiatal hernia present).                           Patchy mildly erythematous mucosa without bleeding                            was found in the gastric antrum. Biopsies were                            taken with a cold forceps for Helicobacter pylori                            testing.                           The examined duodenum was normal. Complications:            No immediate complications. Estimated Blood Loss:     Estimated blood loss was minimal. Impression:               - Benign-appearing esophageal stenoses. Dilated to                            15 mm with balloon.                           - 4 cm hiatal hernia.                           - Gastroesophageal flap valve  classified as Hill                            Grade IV (no fold, wide open lumen, hiatal hernia                            present).                           - Erythematous mucosa in the antrum. Biopsied to                            exclude H. Pylori infection.                           -  Normal examined duodenum. Recommendation:           - Patient has a contact number available for                            emergencies. The signs and symptoms of potential                            delayed complications were discussed with the                            patient. Return to normal activities tomorrow.                            Written discharge instructions were provided to the                            patient.                           - Post-dilation diet and then advance diet as                            tolerated per recommendation.                           - Continue present medications including                            pantoprazole  40 mg daily.                           - Await pathology results.                           - If trouble swallowing solid foods persist repeat                            EGD for further dilation is recommended. Gordy CHRISTELLA Starch, MD 04/23/2024 9:23:46 AM This report has been signed electronically.

## 2024-04-24 ENCOUNTER — Telehealth: Payer: Self-pay

## 2024-04-24 NOTE — Telephone Encounter (Signed)
 Follow up call to pt, no answer.

## 2024-04-27 ENCOUNTER — Other Ambulatory Visit: Payer: Self-pay | Admitting: Medical

## 2024-04-27 ENCOUNTER — Other Ambulatory Visit: Payer: Self-pay

## 2024-04-27 ENCOUNTER — Other Ambulatory Visit: Payer: Self-pay | Admitting: Internal Medicine

## 2024-04-27 ENCOUNTER — Other Ambulatory Visit (HOSPITAL_COMMUNITY): Payer: Self-pay

## 2024-04-27 LAB — SURGICAL PATHOLOGY

## 2024-04-27 MED ORDER — HYDROCODONE-ACETAMINOPHEN 10-325 MG PO TABS
1.0000 | ORAL_TABLET | ORAL | 0 refills | Status: DC | PRN
  Filled 2024-05-01: qty 180, 30d supply, fill #0

## 2024-04-27 MED ORDER — HYDROCODONE-ACETAMINOPHEN 10-325 MG PO TABS
1.0000 | ORAL_TABLET | ORAL | 0 refills | Status: DC | PRN
  Filled 2024-05-30: qty 180, 30d supply, fill #0

## 2024-04-27 MED ORDER — MELOXICAM 15 MG PO TABS
15.0000 mg | ORAL_TABLET | Freq: Every day | ORAL | 0 refills | Status: AC
  Filled 2024-04-27: qty 30, 30d supply, fill #0

## 2024-05-01 ENCOUNTER — Other Ambulatory Visit (HOSPITAL_COMMUNITY): Payer: Self-pay

## 2024-05-04 ENCOUNTER — Ambulatory Visit: Payer: Self-pay | Admitting: Internal Medicine

## 2024-05-28 ENCOUNTER — Other Ambulatory Visit: Payer: Self-pay | Admitting: Internal Medicine

## 2024-05-30 ENCOUNTER — Other Ambulatory Visit (HOSPITAL_COMMUNITY): Payer: Self-pay

## 2024-06-09 ENCOUNTER — Ambulatory Visit: Admitting: Physician Assistant

## 2024-06-23 ENCOUNTER — Other Ambulatory Visit (HOSPITAL_COMMUNITY): Payer: Self-pay

## 2024-06-23 MED ORDER — HYDROCODONE-ACETAMINOPHEN 10-325 MG PO TABS
1.0000 | ORAL_TABLET | ORAL | 0 refills | Status: AC | PRN
  Filled 2024-06-23 – 2024-06-26 (×2): qty 180, 30d supply, fill #0

## 2024-06-23 MED ORDER — HYDROCODONE-ACETAMINOPHEN 10-325 MG PO TABS: 1.0000 | ORAL_TABLET | ORAL | 0 refills | Status: AC | PRN

## 2024-06-26 ENCOUNTER — Other Ambulatory Visit (HOSPITAL_COMMUNITY): Payer: Self-pay

## 2024-06-28 ENCOUNTER — Other Ambulatory Visit: Payer: Self-pay | Admitting: Medical

## 2024-06-29 NOTE — Telephone Encounter (Signed)
 Follow up in early march or sooner if needed.

## 2024-07-08 ENCOUNTER — Encounter: Payer: Self-pay | Admitting: Physician Assistant

## 2024-07-08 ENCOUNTER — Ambulatory Visit (INDEPENDENT_AMBULATORY_CARE_PROVIDER_SITE_OTHER): Admitting: Physician Assistant

## 2024-07-08 VITALS — BP 138/74 | HR 88 | Ht 62.0 in | Wt 133.2 lb

## 2024-07-08 DIAGNOSIS — K222 Esophageal obstruction: Secondary | ICD-10-CM | POA: Diagnosis not present

## 2024-07-08 DIAGNOSIS — Z90710 Acquired absence of both cervix and uterus: Secondary | ICD-10-CM

## 2024-07-08 DIAGNOSIS — K219 Gastro-esophageal reflux disease without esophagitis: Secondary | ICD-10-CM

## 2024-07-08 DIAGNOSIS — K5904 Chronic idiopathic constipation: Secondary | ICD-10-CM

## 2024-07-08 DIAGNOSIS — K648 Other hemorrhoids: Secondary | ICD-10-CM | POA: Diagnosis not present

## 2024-07-08 DIAGNOSIS — N899 Noninflammatory disorder of vagina, unspecified: Secondary | ICD-10-CM | POA: Diagnosis not present

## 2024-07-08 DIAGNOSIS — K5903 Drug induced constipation: Secondary | ICD-10-CM | POA: Diagnosis not present

## 2024-07-08 DIAGNOSIS — T402X5A Adverse effect of other opioids, initial encounter: Secondary | ICD-10-CM | POA: Diagnosis not present

## 2024-07-08 DIAGNOSIS — R1319 Other dysphagia: Secondary | ICD-10-CM

## 2024-07-08 NOTE — Patient Instructions (Addendum)
 _______________________________________________________  If your blood pressure at your visit was 140/90 or greater, please contact your primary care physician to follow up on this.  _______________________________________________________  If you are age 62 or older, your body mass index should be between 23-30. Your Body mass index is 24.37 kg/m. If this is out of the aforementioned range listed, please consider follow up with your Primary Care Provider.  If you are age 50 or younger, your body mass index should be between 19-25. Your Body mass index is 24.37 kg/m. If this is out of the aformentioned range listed, please consider follow up with your Primary Care Provider.   ________________________________________________________  The [facility] providers would like to encourage you to use MYCHART to communicate with providers for non-urgent requests or questions.  Due to long hold times on the telephone, sending your provider a message by [facility] may be a faster and more efficient way to get a response.  Please allow 48 business hours for a response.  Please remember that this is for non-urgent requests.  _______________________________________________________  [facility] is using a team-based approach to care.  Your team is made up of your doctor and two to three APPS. Our APPS (Nurse Practitioners and Physician Assistants) work with your physician to ensure care continuity for you. They are fully qualified to address your health concerns and develop a treatment plan. They communicate directly with your gastroenterologist to care for you. Seeing the Advanced Practice Practitioners on your physician's team can help you by facilitating care more promptly, often allowing for earlier appointments, access to diagnostic testing, procedures, and other specialty referrals.   We have referred you to Urogynocology to evaluate possible bladder prolapse.  Someone should contact you with an  appointment.  If you have not heard from their office in 1 to 2 weeks, please let our office know.    CONTINUE: Senokot-S take  1 - 2 tablets daily  Thank you for entrusting me with your care and choosing [facility].  Ellouise Console, PA-C

## 2024-08-14 ENCOUNTER — Ambulatory Visit (HOSPITAL_BASED_OUTPATIENT_CLINIC_OR_DEPARTMENT_OTHER)
